# Patient Record
Sex: Male | Born: 1935
Health system: Southern US, Community
[De-identification: ages and names within clinical notes are randomized; demographics above are authoritative.]

## PROBLEM LIST (undated history)

## (undated) DIAGNOSIS — G5603 Carpal tunnel syndrome, bilateral upper limbs: Secondary | ICD-10-CM

## (undated) DIAGNOSIS — R972 Elevated prostate specific antigen [PSA]: Secondary | ICD-10-CM

## (undated) DIAGNOSIS — N529 Male erectile dysfunction, unspecified: Secondary | ICD-10-CM

## (undated) DIAGNOSIS — D126 Benign neoplasm of colon, unspecified: Secondary | ICD-10-CM

## (undated) DIAGNOSIS — E039 Hypothyroidism, unspecified: Secondary | ICD-10-CM

## (undated) DIAGNOSIS — M84376A Stress fracture, unspecified foot, initial encounter for fracture: Secondary | ICD-10-CM

## (undated) DIAGNOSIS — J189 Pneumonia, unspecified organism: Secondary | ICD-10-CM

## (undated) DIAGNOSIS — K8020 Calculus of gallbladder without cholecystitis without obstruction: Secondary | ICD-10-CM

## (undated) DIAGNOSIS — M24541 Contracture, right hand: Secondary | ICD-10-CM

## (undated) DIAGNOSIS — I10 Essential (primary) hypertension: Secondary | ICD-10-CM

## (undated) DIAGNOSIS — R7303 Prediabetes: Secondary | ICD-10-CM

## (undated) DIAGNOSIS — H9193 Unspecified hearing loss, bilateral: Secondary | ICD-10-CM

## (undated) DIAGNOSIS — R911 Solitary pulmonary nodule: Secondary | ICD-10-CM

## (undated) DIAGNOSIS — M1990 Unspecified osteoarthritis, unspecified site: Secondary | ICD-10-CM

## (undated) DIAGNOSIS — J918 Pleural effusion in other conditions classified elsewhere: Secondary | ICD-10-CM

## (undated) HISTORY — DX: Hypothyroidism, unspecified: E03.9

## (undated) HISTORY — DX: Contracture, right hand: M24.541

## (undated) HISTORY — DX: Unspecified hearing loss, bilateral: H91.93

## (undated) HISTORY — DX: Solitary pulmonary nodule: R91.1

## (undated) HISTORY — DX: Unspecified osteoarthritis, unspecified site: M19.90

## (undated) HISTORY — DX: Essential (primary) hypertension: I10

## (undated) HISTORY — PX: KNEE SURGERY: SHX244

## (undated) HISTORY — DX: Carpal tunnel syndrome, bilateral upper limbs: G56.03

## (undated) HISTORY — DX: Prediabetes: R73.03

## (undated) HISTORY — DX: Male erectile dysfunction, unspecified: N52.9

## (undated) HISTORY — DX: Benign neoplasm of colon, unspecified: D12.6

## (undated) HISTORY — PX: EYE SURGERY: SHX253

## (undated) HISTORY — DX: Pleural effusion in other conditions classified elsewhere: J91.8

## (undated) HISTORY — DX: Pneumonia, unspecified organism: J18.9

## (undated) HISTORY — DX: Elevated prostate specific antigen (PSA): R97.20

## (undated) HISTORY — DX: Stress fracture, unspecified foot, initial encounter for fracture: M84.376A

## (undated) HISTORY — PX: COLONOSCOPY W/ POLYPECTOMY: SHX1380

---

## 1968-11-04 HISTORY — PX: TONSILLECTOMY: SHX5217

## 1978-11-04 HISTORY — PX: UMBILICAL HERNIA REPAIR: SHX196

## 2010-11-07 ENCOUNTER — Encounter: Payer: Self-pay | Admitting: Family Medicine

## 2010-11-07 ENCOUNTER — Ambulatory Visit
Admission: RE | Admit: 2010-11-07 | Discharge: 2010-11-07 | Payer: Self-pay | Source: Home / Self Care | Attending: Family Medicine | Admitting: Family Medicine

## 2010-11-07 DIAGNOSIS — E039 Hypothyroidism, unspecified: Secondary | ICD-10-CM | POA: Insufficient documentation

## 2010-11-07 DIAGNOSIS — R6882 Decreased libido: Secondary | ICD-10-CM | POA: Insufficient documentation

## 2010-11-07 DIAGNOSIS — I10 Essential (primary) hypertension: Secondary | ICD-10-CM | POA: Insufficient documentation

## 2010-11-09 LAB — CONVERTED CEMR LAB
Sex Hormone Binding: 45 nmol/L (ref 13–71)
Testosterone Free: 55.4 pg/mL (ref 47.0–244.0)
Testosterone: 339.46 ng/dL (ref 250–890)

## 2010-11-23 ENCOUNTER — Encounter: Payer: Self-pay | Admitting: Family Medicine

## 2010-11-27 ENCOUNTER — Encounter: Payer: Self-pay | Admitting: Family Medicine

## 2010-11-30 ENCOUNTER — Telehealth: Payer: Self-pay | Admitting: Family Medicine

## 2010-12-06 NOTE — Letter (Signed)
Summary: ED Questionnaire  ED Questionnaire   Imported By: Lanelle Bal 11/14/2010 08:44:51  _____________________________________________________________________  External Attachment:    Type:   Image     Comment:   External Document

## 2010-12-06 NOTE — Assessment & Plan Note (Signed)
Summary: NOV:Get estab   Vital Signs:  Patient profile:   77 year old male Height:      66 inches Weight:      182 pounds BMI:     29.48 Pulse rate:   96 / minute BP sitting:   136 / 83  (right arm) Cuff size:   regular  Vitals Entered By: Avon Gully CMA, Duncan Dull) (November 07, 2010 9:14 AM) CC: NP-est care refills   CC:  NP-est care refills.  History of Present Illness: Ranout of BP medications about a month ago. Wonders if still needs med.  No hx of heart disease.   He admits has gained weight since he moved her and retired.   He also notes dec libido. Wonders if it is age. Has been that way for almost a year.  Denies any ED problems.   Habits & Providers  Alcohol-Tobacco-Diet     Alcohol drinks/day: 0     Tobacco Status: quit     Year Quit: 1990  Exercise-Depression-Behavior     Does Patient Exercise: yes     Type of exercise: walking     STD Risk: never     Drug Use: no     Seat Belt Use: always  Current Medications (verified): 1)  Aspirin 81 Mg Tbec (Aspirin) 2)  Levothyroxine Sodium 125 Mcg Tabs (Levothyroxine Sodium) 3)  Metoprolol Succinate 25 Mg Xr24h-Tab (Metoprolol Succinate) 4)  Calcium 600 1500 Mg Tabs (Calcium Carbonate) 5)  Chromium Picolinate 500 Mcg Tabs (Chromium Picolinate) 6)  Claritin 10 Mg Tabs (Loratadine)  Allergies (verified): No Known Drug Allergies  Comments:  Nurse/Medical Assistant: The patient's medications and allergies were reviewed with the patient and were updated in the Medication and Allergy Lists. Avon Gully CMA, Duncan Dull) (November 07, 2010 9:21 AM)  Past History:  Past Medical History: Thyroid problem   Past Surgical History: Umbilical Hernia 1965 Laparasocopy right knee.   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 Married to [place] with one child Former Smoker Alcohol use-no Drug use-no Regular exercise-yes Smoking Status:  quit Does Patient  Exercise:  yes STD Risk:  never Drug Use:  no Seat Belt Use:  always  Review of Systems       No fever/sweats/weakness, unexplained weight loss/gain.  No vison changes.  No difficulty hearing/ringing in ears, hay fever/allergies.  No chest pain/discomfort, palpitations.  No Br lump/nipple discharge.  No cough/wheeze.  No blood in BM, nausea/vomiting/diarrhea.  No nighttime urination, leaking urine, unusual vaginal bleeding, discharge (penis or vagina).  No muscle/joint pain. No rash, change in mole.  No HA, memory loss.  No anxiety, sleep d/o, depression.  + easy bruising/bleeding. NO unexplained lump . + concern wiht sexual function.   Physical Exam  General:  Well-developed,well-nourished,in no acute distress; alert,appropriate and cooperative throughout examination Lungs:  Normal respiratory effort, chest expands symmetrically. Lungs are clear to auscultation, no crackles or wheezes. Heart:  Normal rate and regular rhythm. S1 and S2 normal without gallop, murmur, click, rub or other extra sounds. No carotid ro abdominal bruits.  Pulses:  Radial 2+  Extremities:  No LE edema. .    Impression & Recommendations:  Problem # 1:  HYPOTHYROIDISM (ICD-244.9) Due to recheck .Says he takes it regularly.  His updated medication list for this problem includes:    Levothyroxine Sodium 125 Mcg Tabs (Levothyroxine sodium) .Marland Kitchen... Take 1 tablet by mouth once a day  Orders: T-TSH ([id number])  Problem # 2:  LIBIDO, DECREASED (ICD-799.81) TEst quesstionnair scored 4/10.  He denies any problems wiht ED. Will check levels.  May also be change in mood or his relationship with his wife.  Orders: T-Testosterone, Free and Total [phone])  Problem # 3:  HYPERTENSION, BENIGN (ICD-401.1) REstar metoprolol. Monitor for low BPS.  He is due for labwork soon. He plans to schedule a CPE next month.  His updated medication list for this problem includes:    Metoprolol Succinate 25 Mg Xr24h-tab  (Metoprolol succinate) .Marland Kitchen... Take 1 tablet by mouth once a day  Complete Medication List: 1)  Aspirin 81 Mg Tbec (Aspirin) 2)  Levothyroxine Sodium 125 Mcg Tabs (Levothyroxine sodium) .... Take 1 tablet by mouth once a day 3)  Metoprolol Succinate 25 Mg Xr24h-tab (Metoprolol succinate) .... Take 1 tablet by mouth once a day 4)  Calcium 600 1500 Mg Tabs (Calcium carbonate) 5)  Chromium Picolinate 500 Mcg Tabs (Chromium picolinate) 6)  Claritin 10 Mg Tabs (Loratadine)  Other Orders: Flu Vaccine 31yrs + (19147) Admin 1st Vaccine (82956)  Patient Instructions: 1)  Can schedule a physical anytime. 2)  We will call you with your lab results in a couple of days.  Prescriptions: METOPROLOL SUCCINATE 25 MG XR24H-TAB (METOPROLOL SUCCINATE) Take 1 tablet by mouth once a day  #90 x 0   Entered and Authorized by:   Nani Gasser MD   Signed by:   Nani Gasser MD on 11/07/2010   Method used:   Electronically to        [facility]* (retail)       [address] [place], [place]  [address]       Ph: [phone]       Fax: [phone]   RxID:   [id number]    Orders Added: 1)  T-TSH [phone] 2)  T-Testosterone, Free and Total [phone] 3)  Flu Vaccine 63yrs + [63016] 4)  Admin 1st Vaccine [90471] 5)  New Patient Level III [01093]   Immunization History:  Tetanus/Td Immunization History:    Tetanus/Td:  historical (11/04/2009)  Pneumovax Immunization History:    Pneumovax:  historical (11/04/2006)  Immunizations Administered:  Influenza Vaccine # 1:    Vaccine Type: Fluvax 3+    Site: right deltoid    Mfr: GlaxoSmithKline    Dose: 0.5 ml    Route: IM    Given by: Sue Lush McCrimmon CMA, (AAMA)    Exp. Date: 05/04/2011    Lot #: [id number]    VIS given: 05/29/10 version given November 07, 2010.  Flu Vaccine Consent Questions:    Do you have a history of severe allergic reactions to this vaccine? no    Any prior history of allergic reactions  to egg and/or gelatin? no    Do you have a sensitivity to the preservative Thimersol? no    Do you have a past history of Guillan-Barre Syndrome? no    Do you currently have an acute febrile illness? no    Have you ever had a severe reaction to latex? no    Vaccine information given and explained to patient? no   Immunization History:  Tetanus/Td Immunization History:    Tetanus/Td:  Historical (11/04/2009)  Pneumovax Immunization History:    Pneumovax:  Historical (11/04/2006)  Immunizations Administered:  Influenza Vaccine # 1:    Vaccine Type: Fluvax 3+    Site: right deltoid    Mfr: GlaxoSmithKline    Dose: 0.5  ml    Route: IM    Given by: Sue Lush McCrimmon CMA, (AAMA)    Exp. Date: 05/04/2011    Lot #: [id number]    VIS given: 05/29/10 version given November 07, 2010.   Preventive Care Screening  Colonoscopy:    Date:  10/04/2005    Next Due:  10/2010    Results:  abnormal   Last Flu Shot:    Date:  11/07/2010    Results:  Fluvax 3+    Immunization History:  Tetanus/Td Immunization History:    Tetanus/Td:  historical (11/04/2009)  Pneumovax Immunization History:    Pneumovax:  historical (11/04/2006)   Appended Document: NOV:Get estab

## 2010-12-12 ENCOUNTER — Encounter: Payer: Self-pay | Admitting: Family Medicine

## 2010-12-12 ENCOUNTER — Encounter (INDEPENDENT_AMBULATORY_CARE_PROVIDER_SITE_OTHER): Payer: Federal, State, Local not specified - PPO | Admitting: Family Medicine

## 2010-12-12 DIAGNOSIS — Z0000 Encounter for general adult medical examination without abnormal findings: Secondary | ICD-10-CM

## 2010-12-12 NOTE — Progress Notes (Signed)
Summary: Refill Levothyroxin  Phone Note Refill Request Call back at Home Phone [phone] Message from:  Patient  Refills Requested: Medication #1:  LEVOTHYROXINE SODIUM 125 MCG TABS Take 1 tablet by mouth once a day   Dosage confirmed as above?Dosage Confirmed [facility]   Method Requested: Electronic Initial call taken by: Francee Piccolo CMA Duncan Dull),  November 30, 2010 8:59 AM    Prescriptions: LEVOTHYROXINE SODIUM 125 MCG TABS (LEVOTHYROXINE SODIUM) Take 1 tablet by mouth once a day  #30 x 3   Entered by:   Avon Gully CMA, (AAMA)   Authorized by:   Nani Gasser MD   Signed by:   Avon Gully CMA, Duncan Dull) on 12/04/2010   Method used:   Electronically to        [facility]* (retail)       [address] [place], [place]  [address]       Ph: [phone]       Fax: [phone]   RxID:   [id number]

## 2010-12-17 ENCOUNTER — Encounter: Payer: Self-pay | Admitting: Family Medicine

## 2010-12-18 LAB — CONVERTED CEMR LAB
Alkaline Phosphatase: 56 units/L (ref 39–117)
BUN: 14 mg/dL (ref 6–23)
CO2: 25 meq/L (ref 19–32)
Cholesterol: 162 mg/dL (ref 0–200)
Creatinine, Ser: 0.97 mg/dL (ref 0.40–1.50)
Glucose, Bld: 95 mg/dL (ref 70–99)
HDL: 36 mg/dL — ABNORMAL LOW (ref >39)
LDL Cholesterol: 108 mg/dL — ABNORMAL HIGH (ref 0–99)
Total Bilirubin: 0.7 mg/dL (ref 0.3–1.2)
Total CHOL/HDL Ratio: 4.5
Triglycerides: 90 mg/dL (ref <150)
VLDL: 18 mg/dL (ref 0–40)

## 2010-12-20 NOTE — Assessment & Plan Note (Addendum)
Summary: CPE   Vital Signs:  Patient profile:   77 year old male Height:      66 inches Weight:      175 pounds Pulse rate:   73 / minute BP sitting:   115 / 71  (right arm) Cuff size:   regular  Vitals Entered By: Avon Gully CMA, (AAMA) (December 12, 2010 9:10 AM) CC: CPE   CC:  CPE.  History of Present Illness: Has lost 7lbs walking walking daily.   Current Medications (verified): 1)  Aspirin 81 Mg Tbec (Aspirin) 2)  Levothyroxine Sodium 125 Mcg Tabs (Levothyroxine Sodium) .... Take 1 Tablet By Mouth Once A Day 3)  Metoprolol Succinate 25 Mg Xr24h-Tab (Metoprolol Succinate) .... Take 1 Tablet By Mouth Once A Day 4)  Calcium 600 1500 Mg Tabs (Calcium Carbonate) 5)  Chromium Picolinate 500 Mcg Tabs (Chromium Picolinate) 6)  Claritin 10 Mg Tabs (Loratadine)  Allergies (verified): No Known Drug Allergies  Comments:  Nurse/Medical Assistant: The patient's medications and allergies were reviewed with the patient and were updated in the Medication and Allergy Lists. Avon Gully CMA, Duncan Dull) (December 12, 2010 9:10 AM)  Past History:  Past Medical History: Thyroid problem  Wears glasses.   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 Married to [place] with one child Former Smoker.   Alcohol use-no Drug use-no Regular exercise-yes  Review of Systems  The patient denies anorexia, fever, weight loss, weight gain, vision loss, decreased hearing, hoarseness, chest pain, syncope, dyspnea on exertion, peripheral edema, prolonged cough, headaches, hemoptysis, abdominal pain, melena, hematochezia, severe indigestion/heartburn, hematuria, incontinence, genital sores, muscle weakness, suspicious skin lesions, transient blindness, difficulty walking, depression, unusual weight change, abnormal bleeding, enlarged lymph nodes, angioedema, breast masses, and testicular masses.    Physical Exam  General:  Well-developed,well-nourished,in no  acute distress; alert,appropriate and cooperative throughout examination Head:  Normocephalic and atraumatic without obvious abnormalities. No apparent alopecia or balding. Eyes:  No corneal or conjunctival inflammation noted. EOMI. Perrla. Ears:  External ear exam shows no significant lesions or deformities.  Otoscopic examination reveals clear canals, tympanic membranes are intact bilaterally without bulging, retraction, inflammation or discharge. Hearing is grossly normal bilaterally. Nose:  External nasal examination shows no deformity or inflammation.  Mouth:  Oral mucosa and oropharynx without lesions or exudates.  Teeth in good repair. Neck:  No deformities, masses, or tenderness noted. Chest Wall:  No deformities, masses, tenderness or gynecomastia noted. Lungs:  Normal respiratory effort, chest expands symmetrically. Lungs are clear to auscultation, no crackles or wheezes. Heart:  Normal rate and regular rhythm. S1 and S2 normal without gallop, murmur, click, rub or other extra sounds. Abdomen:  Bowel sounds positive,abdomen soft and non-tender without masses, organomegaly or hernias noted. Rectal:  external hemorrhoid(s).   Prostate:  no nodules, no asymmetry, and 3+ enlarged.   Msk:  No deformity or scoliosis noted of thoracic or lumbar spine.   Pulses:  R and L carotid,radial,dorsalis pedis and posterior tibial pulses are full and equal bilaterally Extremities:  No clubbing, cyanosis, edema, or deformity noted with normal full range of motion of all joints.   Neurologic:  No cranial nerve deficits noted. Station and gait are normal. DTRs are symmetrical throughout. Sensory, motor and coordinative functions appear intact. Skin:  no rashes.   Cervical Nodes:  No lymphadenopathy noted Psych:  Cognition and judgment appear intact. Alert and cooperative with normal attention span and concentration. No apparent delusions, illusions, hallucinations  Impression &  Recommendations:  Problem # 1:  HEALTH MAINTENANCE EXAM (ICD-V70.0) Great job on weight loss.   Due for screening labs. BP looks great today.  Just had colonoscoyp 2 weeks ago.  Orders: T-PSA ([id number])  Complete Medication List: 1)  Aspirin 81 Mg Tbec (Aspirin) .... Take 1 tablet by mouth once a day 2)  Levothyroxine Sodium 125 Mcg Tabs (Levothyroxine sodium) .... Take 1 tablet by mouth once a day 3)  Metoprolol Succinate 25 Mg Xr24h-tab (Metoprolol succinate) .... Take 1 tablet by mouth once a day 4)  Calcium 600 1500 Mg Tabs (Calcium carbonate) 5)  Chromium Picolinate 500 Mcg Tabs (Chromium picolinate) 6)  Claritin 10 Mg Tabs (Loratadine) .... Take 1 tablet by mouth once a day  Other Orders: T-Comprehensive Metabolic Panel [id number]) T-Lipid Profile ([id number])  Patient Instructions: 1)  Go to the lab fasting ( 8 hours) for your cholesterol check.   2)  Recheck you thyroid in 6 months 3)  Keep up the good work with the exercise. Your blood pressure looks fantastic. If starts to run low or you feel lightheaded or dizzy then let me know as we may need to stop your metoprolol.    Orders Added: 1)  T-Comprehensive Metabolic Panel [[id number]] 2)  T-Lipid Profile [[id number]] 3)  T-PSA [[id number]] 4)  Est. Patient age 80&> [28413]   Immunization History:  Zostavax History:    Zostavax # 1:  zostavax (11/04/2008)   Immunization History:  Zostavax History:    Zostavax # 1:  Zostavax (11/04/2008)  Flex Sig Next Due:  Not Indicated Hemoccult Next Due:  Not Indicated    Immunization History:  Zostavax History:    Zostavax # 1:  zostavax (11/04/2008)  Appended Document: CPE   Preventive Care Screening  Colonoscopy:    Date:  11/23/2010    Next Due:  12/2015    Results:  normal   Appended Document: historical records update     Immunization History:  Pneumovax Immunization History:    Pneumovax:  historical (02/03/1997)

## 2011-01-01 NOTE — Procedures (Signed)
Summary: Colonoscopy Report/[facility]  Colonoscopy Report/[facility]   Imported By: Maryln Gottron 12/24/2010 13:23:59  _____________________________________________________________________  External Attachment:    Type:   Image     Comment:   External Document

## 2011-01-08 ENCOUNTER — Encounter: Payer: Self-pay | Admitting: Family Medicine

## 2011-01-08 DIAGNOSIS — N529 Male erectile dysfunction, unspecified: Secondary | ICD-10-CM | POA: Insufficient documentation

## 2011-01-15 NOTE — Letter (Signed)
Summary: Records Dated 11-96 to 5-11/[facility]  Records Dated 11-96 to 5-11/[facility]   Imported By: Lanelle Bal 01/11/2011 12:32:56  _____________________________________________________________________  External Attachment:    Type:   Image     Comment:   External Document

## 2011-03-26 ENCOUNTER — Other Ambulatory Visit: Payer: Self-pay | Admitting: Family Medicine

## 2011-04-24 ENCOUNTER — Other Ambulatory Visit: Payer: Self-pay | Admitting: Family Medicine

## 2011-05-23 ENCOUNTER — Other Ambulatory Visit: Payer: Self-pay | Admitting: Family Medicine

## 2011-06-22 ENCOUNTER — Other Ambulatory Visit: Payer: Self-pay | Admitting: Family Medicine

## 2011-07-25 ENCOUNTER — Other Ambulatory Visit: Payer: Self-pay | Admitting: Family Medicine

## 2011-08-24 ENCOUNTER — Other Ambulatory Visit: Payer: Self-pay | Admitting: Family Medicine

## 2011-11-11 ENCOUNTER — Other Ambulatory Visit: Payer: Self-pay | Admitting: Family Medicine

## 2011-11-11 ENCOUNTER — Encounter: Payer: Self-pay | Admitting: Family Medicine

## 2011-11-11 ENCOUNTER — Ambulatory Visit (INDEPENDENT_AMBULATORY_CARE_PROVIDER_SITE_OTHER): Payer: BC Managed Care – PPO | Admitting: Family Medicine

## 2011-11-11 ENCOUNTER — Ambulatory Visit (HOSPITAL_BASED_OUTPATIENT_CLINIC_OR_DEPARTMENT_OTHER)
Admission: RE | Admit: 2011-11-11 | Discharge: 2011-11-11 | Disposition: A | Discharge status: Home / Self Care | Payer: Federal, State, Local not specified - PPO | Source: Ambulatory Visit | Attending: Family Medicine | Admitting: Family Medicine

## 2011-11-11 VITALS — BP 150/79 | HR 68 | Temp 97.7°F | Ht 66.0 in | Wt 178.0 lb

## 2011-11-11 DIAGNOSIS — Z23 Encounter for immunization: Secondary | ICD-10-CM

## 2011-11-11 DIAGNOSIS — R059 Cough, unspecified: Secondary | ICD-10-CM

## 2011-11-11 DIAGNOSIS — R053 Chronic cough: Secondary | ICD-10-CM

## 2011-11-11 DIAGNOSIS — R05 Cough: Secondary | ICD-10-CM

## 2011-11-11 DIAGNOSIS — K130 Diseases of lips: Secondary | ICD-10-CM | POA: Insufficient documentation

## 2011-11-11 DIAGNOSIS — Z125 Encounter for screening for malignant neoplasm of prostate: Secondary | ICD-10-CM

## 2011-11-11 DIAGNOSIS — E039 Hypothyroidism, unspecified: Secondary | ICD-10-CM

## 2011-11-11 DIAGNOSIS — Z0000 Encounter for general adult medical examination without abnormal findings: Secondary | ICD-10-CM

## 2011-11-11 DIAGNOSIS — I10 Essential (primary) hypertension: Secondary | ICD-10-CM

## 2011-11-11 DIAGNOSIS — R972 Elevated prostate specific antigen [PSA]: Secondary | ICD-10-CM | POA: Insufficient documentation

## 2011-11-11 HISTORY — DX: Elevated prostate specific antigen (PSA): R97.20

## 2011-11-11 LAB — CBC WITH DIFFERENTIAL/PLATELET
Eosinophils Relative: 3.4 % (ref 0.0–5.0)
HCT: 47.8 % (ref 39.0–52.0)
Hemoglobin: 16.5 g/dL (ref 13.0–17.0)
Lymphs Abs: 1.9 10*3/uL (ref 0.7–4.0)
Monocytes Relative: 9.3 % (ref 3.0–12.0)
Neutro Abs: 3.5 10*3/uL (ref 1.4–7.7)
RBC: 4.97 Mil/uL (ref 4.22–5.81)
WBC: 6.3 10*3/uL (ref 4.5–10.5)

## 2011-11-11 LAB — TSH: TSH: 0.53 u[IU]/mL (ref 0.35–5.50)

## 2011-11-11 LAB — PSA: PSA: 3.94 ng/mL (ref 0.10–4.00)

## 2011-11-11 LAB — LIPID PANEL
LDL Cholesterol: 123 mg/dL — ABNORMAL HIGH (ref 0–99)
VLDL: 17.6 mg/dL (ref 0.0–40.0)

## 2011-11-11 MED ORDER — AZITHROMYCIN 250 MG PO TABS: ORAL_TABLET | ORAL | Status: DC

## 2011-11-11 NOTE — Assessment & Plan Note (Signed)
Appearance suspicious for Basal cell or squamous cell carcinoma. Refer to Dr. Jorja Loa at [facility] in [place].

## 2011-11-11 NOTE — Assessment & Plan Note (Signed)
This has been stable. Lab Results  Component Value Date   TSH 2.114 11/07/2010    Recheck TSH today. If normal again today will RF med and plan repeat TSH in 1 yr.

## 2011-11-11 NOTE — Progress Notes (Addendum)
Office Note 11/11/2011  CC:  Chief Complaint  Patient presents with  . Establish Care    transfer from Dr. Linford Arnold, our office closer    HPI:  Joel Kelley is a 78 y.o. White male who is here to establish/transfer from [place]. Patient's most recent primary MD: Dr. Linford Arnold. Old records in [facility] were reviewed prior to or during today's visit.  Past Medical History  Diagnosis Date  . HTN (hypertension)   . Hypothyroidism   . Erectile dysfunction   . Adenomatous colon polyp 2012    Bx 2012 showed tubular adenoma but no HG dysplasia  . Hearing loss of both ears     +hearing aids    Past Surgical History  Procedure Date  . Knee surgery     2009, laperoscopic  . Umbilical hernia repair 1980  . Tonsillectomy 1970    No family history on file.  History   Social History  . Marital Status: Married    Spouse Name: Stanton Kidney    Number of Children: N/A  . Years of Education: N/A   Occupational History  . Not on file.   Social History Main Topics  . Smoking status: Former Smoker    Types: Cigarettes  . Smokeless tobacco: Never Used   Comment: quit 20+ years ago  . Alcohol Use: No     None in 18 years  . Drug Use: No  . Sexually Active: Not on file   Other Topics Concern  . 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    Outpatient Encounter Prescriptions as of 11/11/2011  Medication Sig Dispense Refill  . aspirin EC 81 MG tablet Take 81 mg by mouth daily.        . calcium carbonate (CALCIUM 600) 600 MG TABS Take 600 mg by mouth daily.        Marland Kitchen levothyroxine (SYNTHROID, LEVOTHROID) 125 MCG tablet TAKE 1 TABLET BY MOUTH EVERY DAY  90 tablet  0  . loratadine (CLARITIN) 10 MG tablet Take  10 mg by mouth daily.        . Multiple Vitamins-Minerals (CENTRUM SILVER PO) Take 1 tablet by mouth daily.        . niacin 500 MG tablet Take 500 mg by mouth daily with breakfast.        . Omega-3 Fatty Acids (FISH OIL) 1000 MG CAPS Take 1 capsule by mouth daily.        . Psyllium (METAMUCIL PO) Take by mouth daily.        Marland Kitchen DISCONTD: levothyroxine (SYNTHROID, LEVOTHROID) 125 MCG tablet TAKE 1 TABLET BY MOUTH EVERY DAY  90 tablet  0    No Known Allergies  ROS Review of Systems  Constitutional: Negative for fever, chills, appetite change and fatigue.  HENT: Positive for hearing loss (chronic, wears hearing aids bilat). Negative for ear pain, congestion, sore throat, neck stiffness and dental problem.   Eyes: Negative for discharge, redness and visual disturbance.  Respiratory: Negative for chest tightness, shortness of breath and wheezing.        See HPI  Cardiovascular: Negative for chest pain, palpitations and leg swelling.  Gastrointestinal: Negative for nausea, vomiting, abdominal pain, diarrhea and blood in stool.  Genitourinary: Negative for dysuria,  urgency, frequency, hematuria, flank pain and difficulty urinating.  Musculoskeletal: Positive for arthralgias (fingers, stiffness in DIP jts of 2nd-5th digits on both hands). Negative for myalgias, back pain and joint swelling.  Skin: Negative for pallor and rash.       See hpi  Neurological: Negative for dizziness, speech difficulty, weakness and headaches.  Hematological: Negative for adenopathy. Does not bruise/bleed easily.  Psychiatric/Behavioral: Negative for confusion, sleep disturbance and dysphoric mood. The patient is not nervous/anxious.     PE; Blood pressure 150/79, pulse 68, temperature 97.7 F (36.5 C), temperature source Temporal, height 5\' 6"  (1.676 m), weight 178 lb (80.74 kg), SpO2 97.00%. Gen: Alert, well appearing.  Patient is oriented to person, place, time, and situation.  Pleasant affect, lucid thought and  conversation. ENT: Ears: EACs clear, normal epithelium.  TMs with good light reflex and landmarks bilaterally.  Eyes: no injection, icteris, swelling, or exudate.  EOMI, PERRLA. Nose: no drainage or turbinate edema/swelling.  No injection or focal lesion.  Mouth: normal except lower lip has a midline 1cm oval crusty nodule.  Oral mucosa pink and moist.  Partials in place, gingiva unremarkable.  Oropharynx without erythema, exudate, or swelling.  Neck - No masses or thyromegaly or limitation in range of motion RRR, without murmur, rub, or gallop. Carotid pulses easily palpable, symmetric pulsations, no bruit or palpable dilatation. Radial, femoral, and ankle pulses easily palpable and symmetric. No abdominal bruit. No varicosities.  Cap refill in extremities is brisk. Chest with symmetric expansion, good aeration, nonlabored respirations.  Clear and equal breath sounds in all lung fields.  No clubbing or cyanosis. ABD: soft, NT, ND but mildly rotund, BS normal.  No hepatospenomegaly or mass.  No bruits. EXT: no clubbing, cyanosis, or edema.  Musculoskeletal: no joint swelling, erythema, warmth, or tenderness.  ROM of all joints intact. Neuro: CN 2-12 intact bilaterally, strength 5/5 in proximal and distal upper extremities and lower extremities bilaterally.  No sensory deficits.  No tremor.  No disdiadochokinesis.  No ataxia.  Upper extremity and lower extremity DTRs symmetric.  No pronator drift.  Pertinent labs:  None today  ASSESSMENT AND PLAN:   Health maintenance examination Reviewed age and gender appropriate health maintenance issues (prudent diet, regular exercise, health risks of tobacco and excessive alcohol, use of seatbelts, fire alarms in home, use of sunscreen).  Also reviewed age and gender appropriate health screening as well as vaccine recommendations.  Gave flu vaccine IM today.  He is otherwise UTD on vacc's. Checked CBC, CMET, TSH, and FLP today. Also checked PSA for  prostate cancer screening.   Lip lesion Appearance suspicious for Basal cell or squamous cell carcinoma. Refer to Dr. Jorja Loa at [facility] in [place].  Persistent cough Minimally bothersome and only occuring in morning for a brief period, likely residual post-viral pneumonitis cough---reassured. Will check CXR PA/Lat today.  HYPOTHYROIDISM This has been stable. Lab Results  Component Value Date   TSH 2.114 11/07/2010    Recheck TSH today. If normal again today will RF med and plan repeat TSH in 1 yr.  HYPERTENSION, BENIGN Intolerant of metoprolol (dizzy). Need to get pt back on a bp med.  Says he has never been on any other Bp med in the past. Will give recommendation about this when I get his labs back (renal fxn, potassium).   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  Mild stiffness for 2 hours in AM. I advised him to try  tylenol 500-1000mg  qAM to see if this helps some.  F/u 71mo for hypothyroidism and HTN.

## 2011-11-11 NOTE — Assessment & Plan Note (Signed)
Minimally bothersome and only occuring in morning for a brief period, likely residual post-viral pneumonitis cough---reassured. Will check CXR PA/Lat today.

## 2011-11-11 NOTE — Assessment & Plan Note (Signed)
Reviewed age and gender appropriate health maintenance issues (prudent diet, regular exercise, health risks of tobacco and excessive alcohol, use of seatbelts, fire alarms in home, use of sunscreen).  Also reviewed age and gender appropriate health screening as well as vaccine recommendations.  Gave flu vaccine IM today.  He is otherwise UTD on vacc's. Checked CBC, CMET, TSH, and FLP today. Also checked PSA for prostate cancer screening.

## 2011-11-11 NOTE — Assessment & Plan Note (Addendum)
Intolerant of metoprolol (dizzy). Need to get pt back on a bp med.  Says he has never been on any other Bp med in the past. Will give recommendation about this when I get his labs back (renal fxn, potassium).

## 2011-11-12 ENCOUNTER — Telehealth: Payer: Self-pay | Admitting: Family Medicine

## 2011-11-12 DIAGNOSIS — R972 Elevated prostate specific antigen [PSA]: Secondary | ICD-10-CM

## 2011-11-12 LAB — COMPREHENSIVE METABOLIC PANEL
ALT: 23 U/L (ref 0–53)
AST: 22 U/L (ref 0–37)
Albumin: 3.9 g/dL (ref 3.5–5.2)
BUN: 17 mg/dL (ref 6–23)
CO2: 29 mEq/L (ref 19–32)
Calcium: 8.8 mg/dL (ref 8.4–10.5)
Chloride: 109 mEq/L (ref 96–112)
Potassium: 4.4 mEq/L (ref 3.5–5.1)

## 2011-11-12 MED ORDER — LISINOPRIL 10 MG PO TABS: 10.0000 mg | ORAL_TABLET | Freq: Every day | ORAL | Status: DC

## 2011-11-12 NOTE — Telephone Encounter (Signed)
Notified pt of increased PSA velocity on recent testing.   He is ok with urology referral and I have ordered this today. I also discussed starting lisinopril 10mg  qd with him and he's ok with this so I'll send eRx today. His lipids were not at goal, but will discuss this at f/u visit in 35mo or at another f/u visit (need to start statin b/c his goal LDL is <100 due to his RF's). F/u 35mo for pneumonia and HTN.--PM

## 2011-11-22 ENCOUNTER — Telehealth: Payer: Self-pay | Admitting: Family Medicine

## 2011-11-22 MED ORDER — LEVOTHYROXINE SODIUM 125 MCG PO TABS: 125.0000 ug | ORAL_TABLET | Freq: Every day | ORAL | Status: DC

## 2011-11-22 NOTE — Telephone Encounter (Signed)
Please send Rx for Levothyroxine to pharmacy on file

## 2011-11-22 NOTE — Telephone Encounter (Signed)
Last seen 1`/7, follow up in 6 months.  RX sent.

## 2011-12-13 ENCOUNTER — Ambulatory Visit (INDEPENDENT_AMBULATORY_CARE_PROVIDER_SITE_OTHER): Payer: Federal, State, Local not specified - PPO | Admitting: Family Medicine

## 2011-12-13 ENCOUNTER — Encounter: Payer: Self-pay | Admitting: Family Medicine

## 2011-12-13 DIAGNOSIS — K130 Diseases of lips: Secondary | ICD-10-CM

## 2011-12-13 DIAGNOSIS — I10 Essential (primary) hypertension: Secondary | ICD-10-CM

## 2011-12-13 DIAGNOSIS — J189 Pneumonia, unspecified organism: Secondary | ICD-10-CM | POA: Insufficient documentation

## 2011-12-13 DIAGNOSIS — E785 Hyperlipidemia, unspecified: Secondary | ICD-10-CM | POA: Insufficient documentation

## 2011-12-13 MED ORDER — ATORVASTATIN CALCIUM 10 MG PO TABS: 10.0000 mg | ORAL_TABLET | Freq: Every day | ORAL | Status: DC

## 2011-12-13 NOTE — Assessment & Plan Note (Signed)
Resolved.  He has a bit of residual cough but no sign of persistent infection.

## 2011-12-13 NOTE — Assessment & Plan Note (Signed)
Improved over the last month, but I still think he should keep his derm appt that is set for next week to take a look at this lesion.

## 2011-12-13 NOTE — Progress Notes (Signed)
OFFICE VISIT  12/13/2011   CC:  Chief Complaint  Patient presents with  . Pneumonia    still has productive cough, clear/white phlegm  . Hypertension     HPI:    Patient is a 78 y.o. Caucasian male who presents for 1 mo f/u HTN, pneumonia, hyperlipidemia. Started lisinopril 10mg  80mo ago.  He had slight infiltrate on CXR in setting of prolonged resp illness at that time so I rx'd a course of azithromycin.  He has improved significantly, still has some intermittent daytime coughing with a bit of clear sputum.  No fever, no SOB, no fatigue.  Appetite is good.  Has not checked BP outside of our office since last visit here.  Tolerating lisinopril without side effect. Says lower lip lesion has improved.  He has an initial consult visit set up with urology 01/08/12 for increased PSA velocity.  Past Medical History  Diagnosis Date  . HTN (hypertension)   . Hypothyroidism   . Erectile dysfunction   . Adenomatous colon polyp 2012    Bx 2012 showed tubular adenoma but no HG dysplasia  . Hearing loss of both ears     +hearing aids    Past Surgical History  Procedure Date  . Knee surgery     2009, laperoscopic  . Umbilical hernia repair 1980  . Tonsillectomy 1970    Outpatient Prescriptions Prior to Visit  Medication Sig Dispense Refill  . aspirin EC 81 MG tablet Take 81 mg by mouth daily.        . calcium carbonate (CALCIUM 600) 600 MG TABS Take 600 mg by mouth daily.        Marland Kitchen levothyroxine (SYNTHROID, LEVOTHROID) 125 MCG tablet Take 1 tablet (125 mcg total) by mouth daily.  90 tablet  1  . lisinopril (PRINIVIL,ZESTRIL) 10 MG tablet Take 1 tablet (10 mg total) by mouth daily.  30 tablet  3  . loratadine (CLARITIN) 10 MG tablet Take 10 mg by mouth daily.        . Multiple Vitamins-Minerals (CENTRUM SILVER PO) Take 1 tablet by mouth daily.        . niacin 500 MG tablet Take 500 mg by mouth daily with breakfast.        . Omega-3 Fatty Acids (FISH OIL) 1000 MG CAPS Take 1 capsule by  mouth daily.        . Psyllium (METAMUCIL PO) Take by mouth daily.        Marland Kitchen azithromycin (ZITHROMAX) 250 MG tablet 2 tabs po qd x 1d, then 1 tab po qd x 4d  6 each  0    No Known Allergies  ROS As per HPI  PE: Blood pressure 131/80, pulse 69, height 5\' 6"  (1.676 m), weight 174 lb (78.926 kg). Gen: Alert, well appearing.  Patient is oriented to person, place, time, and situation. Lower lip: central portion with small mucosal irregularity--almost like a plaque.  No longer appears ulcerated. NECK: no LAD. CV: RRR, no m/r/g.   LUNGS: CTA bilat, nonlabored resps, good aeration in all lung fields.  LABS:  None today  IMPRESSION AND PLAN:  HYPERTENSION, BENIGN Controlled.  Continue lisinopril 10mg  qd.  Pneumonia Resolved.  He has a bit of residual cough but no sign of persistent infection.  Hyperlipidemia Lab Results  Component Value Date   CHOL 179 11/11/2011   HDL 38.70* 11/11/2011   LDLCALC 123* 11/11/2011   TRIG 88.0 11/11/2011   CHOLHDL 5 11/11/2011   I'd like his LDL  goal to be around 100 due to RFs. Start atorvastatin 10mg  qd today.  Recheck lipids and CMET in 57mo.  Therapeutic expectations and side effect profile of medication discussed today.  Patient's questions answered.   Lip lesion Improved over the last month, but I still think he should keep his derm appt that is set for next week to take a look at this lesion.     FOLLOW UP: Return in about 3 months (around 03/11/2012) for (fasting) morning appt with me for f/u HTN and lipid panel.

## 2011-12-13 NOTE — Assessment & Plan Note (Signed)
Lab Results  Component Value Date   CHOL 179 11/11/2011   HDL 38.70* 11/11/2011   LDLCALC 123* 11/11/2011   TRIG 88.0 11/11/2011   CHOLHDL 5 11/11/2011   I'd like his LDL goal to be around 100 due to RFs. Start atorvastatin 10mg  qd today.  Recheck lipids and CMET in 35mo.  Therapeutic expectations and side effect profile of medication discussed today.  Patient's questions answered.

## 2011-12-13 NOTE — Assessment & Plan Note (Signed)
Controlled.  Continue lisinopril 10mg  qd.

## 2012-01-14 ENCOUNTER — Encounter: Payer: Self-pay | Admitting: Family Medicine

## 2012-03-09 ENCOUNTER — Other Ambulatory Visit: Payer: Self-pay | Admitting: *Deleted

## 2012-03-09 MED ORDER — LISINOPRIL 10 MG PO TABS: 10.0000 mg | ORAL_TABLET | Freq: Every day | ORAL | Status: DC

## 2012-03-09 NOTE — Telephone Encounter (Signed)
Faxed refill request received from pharmacy for LISINOPRIL Last filled by MD on 11/12/11, #30 X 3 Last seen on 12/13/11 Follow up May 2013, no appt scheduled. 30 day supply sent to pharmacy.

## 2012-03-11 ENCOUNTER — Encounter: Payer: Self-pay | Admitting: Family Medicine

## 2012-03-11 ENCOUNTER — Ambulatory Visit (INDEPENDENT_AMBULATORY_CARE_PROVIDER_SITE_OTHER): Payer: Federal, State, Local not specified - PPO | Admitting: Family Medicine

## 2012-03-11 VITALS — BP 139/75 | HR 69 | Ht 66.0 in | Wt 170.0 lb

## 2012-03-11 DIAGNOSIS — K130 Diseases of lips: Secondary | ICD-10-CM

## 2012-03-11 DIAGNOSIS — R972 Elevated prostate specific antigen [PSA]: Secondary | ICD-10-CM

## 2012-03-11 DIAGNOSIS — I10 Essential (primary) hypertension: Secondary | ICD-10-CM

## 2012-03-11 DIAGNOSIS — R059 Cough, unspecified: Secondary | ICD-10-CM

## 2012-03-11 DIAGNOSIS — E039 Hypothyroidism, unspecified: Secondary | ICD-10-CM

## 2012-03-11 DIAGNOSIS — E785 Hyperlipidemia, unspecified: Secondary | ICD-10-CM

## 2012-03-11 DIAGNOSIS — R05 Cough: Secondary | ICD-10-CM

## 2012-03-11 LAB — TSH: TSH: 0.46 u[IU]/mL (ref 0.35–5.50)

## 2012-03-11 LAB — LIPID PANEL
Cholesterol: 120 mg/dL (ref 0–200)
HDL: 38.7 mg/dL — ABNORMAL LOW (ref >39.00)
LDL Cholesterol: 67 mg/dL (ref 0–99)
Triglycerides: 74 mg/dL (ref 0.0–149.0)
VLDL: 14.8 mg/dL (ref 0.0–40.0)

## 2012-03-11 NOTE — Assessment & Plan Note (Signed)
Last TSH 11/2011 was borderline low. Will recheck this today.

## 2012-03-11 NOTE — Assessment & Plan Note (Signed)
Problem stable.  Continue current medications and diet appropriate for this condition.  We have reviewed our general long term plan for this problem and also reviewed symptoms and signs that should prompt the patient to call or return to the office.  

## 2012-03-11 NOTE — Assessment & Plan Note (Signed)
Mild bronchitis/resolving URI--he is gradually improving and feels well and looks excellent today. Discussed signs/symptoms of worsening illness and what to call or return for, otherwise continue symptomatic care with mucinex or mucinex DM.

## 2012-03-11 NOTE — Assessment & Plan Note (Signed)
Tolerating atorvastatin 10mg  the last 49mo. Recheck lipids, AST/ALT today.

## 2012-03-11 NOTE — Progress Notes (Signed)
OFFICE VISIT  03/11/2012   CC:  Chief Complaint  Patient presents with  . Follow-up    HTN, lipids     HPI:    Patient is a 78 y.o. Hispanic male who presents for 3 mo f/u for HTN, hyperlipidemia. Started atorvastatin 10mg  qd 3 mo ago.  Tolerating this well.  Feels like he's getting over a cold over the last 2 wks, has a lingering cough again.  No chest pains.  No fevers.  Appetite and energy level good.  URI sx's initially --lasted 3-4 days--have resolved but slightly productive cough persists. He has had pneumonia 3 times in the last 5 yrs.  He reports that he had a prostate biopsy since last visit here and it was negative/benign: he has plans for f/u with urologist in about 74mo. He also went to the dermatologist and his lip lesion, which the dermatologist agreed did look cancerous, was completely frozen off and is all gone now.  ROS: no GERD, no abd pains, no undesired wt loss, no wheezing or chest tightness, no ST, no HA. No hematuria or dysuria or voiding problems.    Past Medical History  Diagnosis Date  . HTN (hypertension)   . Hypothyroidism   . Erectile dysfunction   . Adenomatous colon polyp 2012    Bx 2012 showed tubular adenoma but no HG dysplasia  . Hearing loss of both ears     +hearing aids  . Increased prostate specific antigen (PSA) velocity 11/11/2011    Urol eval 01/08/12--plans to proceed with biopsy    Past Surgical History  Procedure Date  . Knee surgery     2009, laperoscopic  . Umbilical hernia repair 1980  . Tonsillectomy 1970    Outpatient Prescriptions Prior to Visit  Medication Sig Dispense Refill  . aspirin EC 81 MG tablet Take 81 mg by mouth daily.        Marland Kitchen atorvastatin (LIPITOR) 10 MG tablet Take 1 tablet (10 mg total) by mouth daily.  30 tablet  3  . calcium carbonate (CALCIUM 600) 600 MG TABS Take 600 mg by mouth daily.        Marland Kitchen levothyroxine (SYNTHROID, LEVOTHROID) 125 MCG tablet Take 1 tablet (125 mcg total) by mouth daily.  90 tablet   1  . lisinopril (PRINIVIL,ZESTRIL) 10 MG tablet Take 1 tablet (10 mg total) by mouth daily. MUST HAVE APPOINTMENT FOR MORE REFILLS.  30 tablet  0  . loratadine (CLARITIN) 10 MG tablet Take 10 mg by mouth daily.        . Multiple Vitamins-Minerals (CENTRUM SILVER PO) Take 1 tablet by mouth daily.        . niacin 500 MG tablet Take 500 mg by mouth daily with breakfast.        . Omega-3 Fatty Acids (FISH OIL) 1000 MG CAPS Take 1 capsule by mouth daily.        . Psyllium (METAMUCIL PO) Take by mouth daily.          No Known Allergies  ROS As per HPI  PE: Blood pressure 139/75, pulse 69, height 5\' 6"  (1.676 m), weight 170 lb (77.111 kg). Gen: Alert, well appearing.  Patient is oriented to person, place, time, and situation. ENT: Ears: EACs clear, normal epithelium.  TMs with good light reflex and landmarks bilaterally.  Eyes: no injection, icteris, swelling, or exudate.  EOMI, PERRLA. Nose: no drainage or turbinate edema/swelling.  No injection or focal lesion.  Mouth: lips without lesion/swelling.  Oral  mucosa pink and moist.  Dentition intact and without obvious caries or gingival swelling.  Oropharynx without erythema, exudate, or swelling.  Neck: supple/nontender.  No LAD, mass, or TM.  Carotid pulses 2+ bilaterally, without bruits. CV: RRR, no m/r/g.   LUNGS: CTA bilat, nonlabored resps, good aeration in all lung fields. EXT: no clubbing, cyanosis, or edema.   LABS:  None today  IMPRESSION AND PLAN:  HYPERTENSION, BENIGN Problem stable.  Continue current medications and diet appropriate for this condition.  We have reviewed our general long term plan for this problem and also reviewed symptoms and signs that should prompt the patient to call or return to the office.   Hyperlipidemia Tolerating atorvastatin 10mg  the last 35mo. Recheck lipids, AST/ALT today.  HYPOTHYROIDISM Last TSH 11/2011 was borderline low. Will recheck this today.  Increased prostate specific antigen (PSA)  velocity Recent prostate biopsy neg per pt report. He has plan for f/u with urologist in about 20mo.  Lip lesion Successfully frozen off by dermatologist since last visit.  Cough Mild bronchitis/resolving URI--he is gradually improving and feels well and looks excellent today. Discussed signs/symptoms of worsening illness and what to call or return for, otherwise continue symptomatic care with mucinex or mucinex DM.     FOLLOW UP: Return in about 6 months (around 09/11/2012) for f/u HTN, hyperlip, hypothyr.

## 2012-03-11 NOTE — Assessment & Plan Note (Signed)
Successfully frozen off by dermatologist since last visit.

## 2012-03-11 NOTE — Assessment & Plan Note (Signed)
Recent prostate biopsy neg per pt report. He has plan for f/u with urologist in about 61mo.

## 2012-03-24 ENCOUNTER — Other Ambulatory Visit: Payer: Self-pay | Admitting: *Deleted

## 2012-03-24 MED ORDER — LISINOPRIL 10 MG PO TABS: 10.0000 mg | ORAL_TABLET | Freq: Every day | ORAL | Status: DC

## 2012-03-24 NOTE — Telephone Encounter (Signed)
Faxed refill request received from pharmacy for Lisinopril 90 day Last filled by MD on 03/09/12, #30 x 0 Last seen on 03/11/12 Follow up in 6 months. RX sent.

## 2012-04-07 ENCOUNTER — Other Ambulatory Visit: Payer: Self-pay | Admitting: *Deleted

## 2012-04-07 MED ORDER — ATORVASTATIN CALCIUM 10 MG PO TABS: 10.0000 mg | ORAL_TABLET | Freq: Every day | ORAL | Status: DC

## 2012-04-07 NOTE — Telephone Encounter (Signed)
Faxed refill request received from pharmacy for atorvastatin Last filled by MD on 12/13/11, #30 x 3 Last seen on 03/11/12 Follow up in 6 months. RX sent.

## 2012-08-17 ENCOUNTER — Other Ambulatory Visit: Payer: Self-pay | Admitting: Family Medicine

## 2012-08-19 ENCOUNTER — Encounter: Payer: Self-pay | Admitting: Family Medicine

## 2012-08-19 ENCOUNTER — Other Ambulatory Visit: Payer: Self-pay | Admitting: *Deleted

## 2012-08-19 MED ORDER — LEVOTHYROXINE SODIUM 125 MCG PO TABS: 125.0000 ug | ORAL_TABLET | Freq: Every day | ORAL | Status: DC

## 2012-08-19 NOTE — Telephone Encounter (Signed)
Faxed refill request received from pharmacy for LEVOTHYROXINE  Last filled by MD on 11/22/11, #90 X 1 Last seen on 03/11/12 Follow up 09/11/12 RX SENT.

## 2012-09-11 ENCOUNTER — Encounter: Payer: Self-pay | Admitting: Family Medicine

## 2012-09-11 ENCOUNTER — Ambulatory Visit (INDEPENDENT_AMBULATORY_CARE_PROVIDER_SITE_OTHER): Payer: Federal, State, Local not specified - PPO | Admitting: Family Medicine

## 2012-09-11 VITALS — BP 138/71 | HR 66 | Temp 97.6°F | Ht 66.0 in | Wt 174.0 lb

## 2012-09-11 DIAGNOSIS — I10 Essential (primary) hypertension: Secondary | ICD-10-CM

## 2012-09-11 DIAGNOSIS — E785 Hyperlipidemia, unspecified: Secondary | ICD-10-CM

## 2012-09-11 DIAGNOSIS — R972 Elevated prostate specific antigen [PSA]: Secondary | ICD-10-CM

## 2012-09-11 DIAGNOSIS — E039 Hypothyroidism, unspecified: Secondary | ICD-10-CM

## 2012-09-11 DIAGNOSIS — Z0000 Encounter for general adult medical examination without abnormal findings: Secondary | ICD-10-CM

## 2012-09-11 LAB — COMPREHENSIVE METABOLIC PANEL
ALT: 19 U/L (ref 0–53)
CO2: 29 mEq/L (ref 19–32)
Calcium: 8.6 mg/dL (ref 8.4–10.5)
Chloride: 108 mEq/L (ref 96–112)
Creatinine, Ser: 1.1 mg/dL (ref 0.4–1.5)
GFR: 72.93 mL/min (ref >60.00)
Glucose, Bld: 93 mg/dL (ref 70–99)
Total Bilirubin: 1 mg/dL (ref 0.3–1.2)

## 2012-09-11 LAB — LIPID PANEL
HDL: 39.2 mg/dL (ref >39.00)
Total CHOL/HDL Ratio: 3

## 2012-09-11 NOTE — Progress Notes (Signed)
OFFICE NOTE  09/11/2012  CC:  Chief Complaint  Patient presents with  . Follow-up    HTN, cholesterol, hypothyroid     HPI: Patient is a 78 y.o. Caucasian male who is here for HTN, hypothyroidism, and hyperlipidemia. Urologist checkup a few weeks ago was good, PSA stable and he has f/u 6 mo. Compliant with meds, no side effects.  He feels good, has no acute complaints. Has been walking still and lately raking a lot of leaves for exercise.   Pertinent PMH:  Past Medical History  Diagnosis Date  . HTN (hypertension)   . Hypothyroidism   . Erectile dysfunction   . Adenomatous colon polyp 2012    Bx 2012 showed tubular adenoma but no HG dysplasia  . Hearing loss of both ears     +hearing aids  . Increased prostate specific antigen (PSA) velocity 11/11/2011    Biopsy negative 03/11/12.  Followed by Dr. Laverle Patter.    MEDS:  Outpatient Prescriptions Prior to Visit  Medication Sig Dispense Refill  . aspirin EC 81 MG tablet Take 81 mg by mouth daily.        Marland Kitchen atorvastatin (LIPITOR) 10 MG tablet Take 1 tablet (10 mg total) by mouth daily.  30 tablet  5  . calcium carbonate (CALCIUM 600) 600 MG TABS Take 600 mg by mouth daily.        Marland Kitchen levothyroxine (SYNTHROID, LEVOTHROID) 125 MCG tablet Take 1 tablet (125 mcg total) by mouth daily.  90 tablet  1  . lisinopril (PRINIVIL,ZESTRIL) 10 MG tablet Take 1 tablet (10 mg total) by mouth daily.  90 tablet  1  . loratadine (CLARITIN) 10 MG tablet Take 10 mg by mouth daily.        . Multiple Vitamins-Minerals (CENTRUM SILVER PO) Take 1 tablet by mouth daily.        . niacin 500 MG tablet Take 500 mg by mouth daily with breakfast.        . Omega-3 Fatty Acids (FISH OIL) 1000 MG CAPS Take 1 capsule by mouth daily.        . Psyllium (METAMUCIL PO) Take by mouth daily.         Last reviewed on 09/11/2012  8:13 AM by Jeoffrey Massed, MD  PE: Blood pressure 138/71, pulse 66, temperature 97.6 F (36.4 C), temperature source Temporal, height 5\' 6"  (1.676  m), weight 174 lb (78.926 kg). Gen: Alert, well appearing.  Patient is oriented to person, place, time, and situation. Neck - No masses or thyromegaly or limitation in range of motion CV: RRR, no m/r/g.   LUNGS: CTA bilat, nonlabored resps, good aeration in all lung fields. EXT: no clubbing, cyanosis, or edema.   LAB: none today  IMPRESSION AND PLAN:  HYPERTENSION, BENIGN Problem stable.  Continue current medications and diet appropriate for this condition.  We have reviewed our general long term plan for this problem and also reviewed symptoms and signs that should prompt the patient to call or return to the office. Check Cr/lytes.  Hyperlipidemia Problem stable.  Continue current medications and diet appropriate for this condition.  We have reviewed our general long term plan for this problem and also reviewed symptoms and signs that should prompt the patient to call or return to the office. Check FLP.  HYPOTHYROIDISM Problem stable.  Continue current medications and diet appropriate for this condition.  We have reviewed our general long term plan for this problem and also reviewed symptoms and signs that should  prompt the patient to call or return to the office.   Increased prostate specific antigen (PSA) velocity Stable and getting appropriate urologic f/u.   Flu vaccine IM today.  An After Visit Summary was printed and given to the patient.  FOLLOW UP:  6 mo for CPE, fasting labs the week prior.

## 2012-09-11 NOTE — Assessment & Plan Note (Signed)
Problem stable.  Continue current medications and diet appropriate for this condition.  We have reviewed our general long term plan for this problem and also reviewed symptoms and signs that should prompt the patient to call or return to the office. Check FLP.

## 2012-09-11 NOTE — Assessment & Plan Note (Signed)
Stable and getting appropriate urologic f/u.

## 2012-09-11 NOTE — Assessment & Plan Note (Signed)
Problem stable.  Continue current medications and diet appropriate for this condition.  We have reviewed our general long term plan for this problem and also reviewed symptoms and signs that should prompt the patient to call or return to the office.  

## 2012-09-11 NOTE — Assessment & Plan Note (Signed)
Problem stable.  Continue current medications and diet appropriate for this condition.  We have reviewed our general long term plan for this problem and also reviewed symptoms and signs that should prompt the patient to call or return to the office. Check Cr/lytes.

## 2012-09-16 ENCOUNTER — Other Ambulatory Visit: Payer: Self-pay | Admitting: *Deleted

## 2012-09-16 MED ORDER — LISINOPRIL 10 MG PO TABS: 10.0000 mg | ORAL_TABLET | Freq: Every day | ORAL | Status: DC

## 2012-09-16 NOTE — Telephone Encounter (Signed)
Faxed refill request received from pharmacy for LISINOPRIL  Last filled by MD on 03/24/12, #90 X 1 Last seen on 09/11/12 Refill sent per [facility] refill protocol.

## 2012-10-05 ENCOUNTER — Other Ambulatory Visit: Payer: Self-pay | Admitting: *Deleted

## 2012-10-05 MED ORDER — ATORVASTATIN CALCIUM 10 MG PO TABS: 10.0000 mg | ORAL_TABLET | Freq: Every day | ORAL | Status: DC

## 2012-10-05 NOTE — Telephone Encounter (Signed)
ERX failed.  RX resent.

## 2012-10-05 NOTE — Telephone Encounter (Signed)
Faxed refill request received from pharmacy for ATORVASTATIN Last filled by MD on 04/07/12, #30 X 5 Last seen on 09/11/12 Follow up 6 MONTHS Refill sent per [facility] refill protocol.

## 2013-02-12 ENCOUNTER — Other Ambulatory Visit: Payer: Self-pay | Admitting: *Deleted

## 2013-02-12 MED ORDER — LEVOTHYROXINE SODIUM 125 MCG PO TABS: 125.0000 ug | ORAL_TABLET | Freq: Every day | ORAL | Status: DC

## 2013-02-12 NOTE — Telephone Encounter (Signed)
Faxed refill request received from pharmacy for LEVOTHYROXINE Last filled by MD on 08/19/12, #90 X 1 Last seen on 09/21/12 Follow up 03/11/13 RX SENT.

## 2013-02-16 ENCOUNTER — Encounter: Payer: Self-pay | Admitting: Family Medicine

## 2013-03-03 ENCOUNTER — Other Ambulatory Visit (INDEPENDENT_AMBULATORY_CARE_PROVIDER_SITE_OTHER): Payer: Federal, State, Local not specified - PPO

## 2013-03-03 DIAGNOSIS — Z0000 Encounter for general adult medical examination without abnormal findings: Secondary | ICD-10-CM

## 2013-03-03 LAB — CBC WITH DIFFERENTIAL/PLATELET
Eosinophils Relative: 5.3 % — ABNORMAL HIGH (ref 0.0–5.0)
HCT: 45.6 % (ref 39.0–52.0)
Hemoglobin: 15.5 g/dL (ref 13.0–17.0)
Lymphs Abs: 1.8 10*3/uL (ref 0.7–4.0)
Monocytes Relative: 10 % (ref 3.0–12.0)
Platelets: 188 10*3/uL (ref 150.0–400.0)
WBC: 5.8 10*3/uL (ref 4.5–10.5)

## 2013-03-03 LAB — COMPREHENSIVE METABOLIC PANEL
ALT: 16 U/L (ref 0–53)
Albumin: 3.7 g/dL (ref 3.5–5.2)
CO2: 28 mEq/L (ref 19–32)
Calcium: 8.6 mg/dL (ref 8.4–10.5)
Chloride: 109 mEq/L (ref 96–112)
GFR: 88.15 mL/min (ref >60.00)
Glucose, Bld: 90 mg/dL (ref 70–99)
Potassium: 4 mEq/L (ref 3.5–5.1)
Sodium: 141 mEq/L (ref 135–145)
Total Protein: 6.4 g/dL (ref 6.0–8.3)

## 2013-03-03 LAB — LIPID PANEL: Total CHOL/HDL Ratio: 3

## 2013-03-03 LAB — TSH: TSH: 0.41 u[IU]/mL (ref 0.35–5.50)

## 2013-03-03 NOTE — Progress Notes (Signed)
Labs only

## 2013-03-04 ENCOUNTER — Other Ambulatory Visit: Payer: Federal, State, Local not specified - PPO

## 2013-03-05 ENCOUNTER — Encounter: Payer: Self-pay | Admitting: *Deleted

## 2013-03-11 ENCOUNTER — Ambulatory Visit (INDEPENDENT_AMBULATORY_CARE_PROVIDER_SITE_OTHER): Payer: Federal, State, Local not specified - PPO | Admitting: Family Medicine

## 2013-03-11 ENCOUNTER — Encounter: Payer: Self-pay | Admitting: Family Medicine

## 2013-03-11 ENCOUNTER — Ambulatory Visit: Payer: Federal, State, Local not specified - PPO | Admitting: Family Medicine

## 2013-03-11 VITALS — BP 117/71 | HR 72 | Temp 97.9°F | Resp 16 | Ht 65.5 in | Wt 171.5 lb

## 2013-03-11 DIAGNOSIS — R972 Elevated prostate specific antigen [PSA]: Secondary | ICD-10-CM

## 2013-03-11 DIAGNOSIS — I10 Essential (primary) hypertension: Secondary | ICD-10-CM

## 2013-03-11 DIAGNOSIS — E039 Hypothyroidism, unspecified: Secondary | ICD-10-CM

## 2013-03-11 DIAGNOSIS — E785 Hyperlipidemia, unspecified: Secondary | ICD-10-CM

## 2013-03-11 NOTE — Patient Instructions (Signed)
Try OTC nasal steroid called nasacort: 2 sprays each nostril once daily.

## 2013-03-11 NOTE — Progress Notes (Signed)
OFFICE NOTE  03/11/2013  CC:  Chief Complaint  Patient presents with  . Annual Exam    6-mth F/U     HPI: Patient is a 79 y.o. Caucasian male who is here for 6 mo f/u HTN, hyperlip, hypoth. We reviewed recent labs in detail today: all normal except slightly low but stable HDL. No acute complaints. No BPs from home to report. Reports compliance with meds.   Pertinent PMH:  Past Medical History  Diagnosis Date  . HTN (hypertension)   . Hypothyroidism   . Erectile dysfunction   . Adenomatous colon polyp 2012    Bx 2012 showed tubular adenoma but no HG dysplasia  . Hearing loss of both ears     +hearing aids  . Increased prostate specific antigen (PSA) velocity 11/11/2011    Biopsy negative 03/11/12.  Followed by Dr. Laverle Patter (as of 02/2013, PSA stable and he is to f/u with Dr. Laverle Patter annually.   Past Surgical History  Procedure Laterality Date  . Knee surgery      2009, laperoscopic  . Umbilical hernia repair  1980  . Tonsillectomy  1970    MEDS:  Outpatient Prescriptions Prior to Visit  Medication Sig Dispense Refill  . aspirin EC 81 MG tablet Take 81 mg by mouth daily.        Marland Kitchen atorvastatin (LIPITOR) 10 MG tablet Take 1 tablet (10 mg total) by mouth daily.  30 tablet  5  . calcium carbonate (CALCIUM 600) 600 MG TABS Take 600 mg by mouth daily.        Marland Kitchen levothyroxine (SYNTHROID, LEVOTHROID) 125 MCG tablet Take 1 tablet (125 mcg total) by mouth daily.  90 tablet  1  . lisinopril (PRINIVIL,ZESTRIL) 10 MG tablet Take 1 tablet (10 mg total) by mouth daily.  90 tablet  1  . loratadine (CLARITIN) 10 MG tablet Take 10 mg by mouth daily.        . Multiple Vitamins-Minerals (CENTRUM SILVER PO) Take 1 tablet by mouth daily.        . niacin 500 MG tablet Take 500 mg by mouth daily with breakfast.        . Omega-3 Fatty Acids (FISH OIL) 1000 MG CAPS Take 1 capsule by mouth daily.        . Psyllium (METAMUCIL PO) Take by mouth daily.         No facility-administered medications prior  to visit.    PE: Blood pressure 117/71, pulse 72, temperature 97.9 F (36.6 C), temperature source Oral, resp. rate 16, height 5' 5.5" (1.664 m), weight 171 lb 8 oz (77.792 kg), SpO2 96.00%. Gen: Alert, well appearing.  Patient is oriented to person, place, time, and situation. AFFECT: pleasant, lucid thought and speech. ENT: Ears: EACs clear, normal epithelium.  TMs with good light reflex and landmarks bilaterally.  Eyes: no injection, icteris, swelling, or exudate.  EOMI, PERRLA. Nose: no drainage or turbinate edema/swelling.  No injection or focal lesion.  Mouth: lips without lesion/swelling.  Oral mucosa pink and moist.  Dentition intact and without obvious caries or gingival swelling.  Oropharynx without erythema, exudate, or swelling.  Neck: supple/nontender.  No LAD, mass, or TM.  Carotid pulses 2+ bilaterally, without bruits. CV: RRR, no m/r/g.   LUNGS: CTA bilat, nonlabored resps, good aeration in all lung fields. ABD: soft, NT, ND, BS normal.  No hepatospenomegaly or mass.  No bruits. EXT: no clubbing, cyanosis, or edema.  Musculoskeletal: no joint swelling, erythema, warmth, or tenderness.  ROM of all joints intact. Skin - no sores or suspicious lesions or rashes or color changes  LABS:  Lab Results  Component Value Date   TSH 0.41 03/03/2013   Lab Results  Component Value Date   WBC 5.8 03/03/2013   HGB 15.5 03/03/2013   HCT 45.6 03/03/2013   MCV 94.8 03/03/2013   PLT 188.0 03/03/2013   Lab Results  Component Value Date   CREATININE 0.9 03/03/2013   BUN 17 03/03/2013   NA 141 03/03/2013   K 4.0 03/03/2013   CL 109 03/03/2013   CO2 28 03/03/2013   Lab Results  Component Value Date   ALT 16 03/03/2013   AST 20 03/03/2013   ALKPHOS 55 03/03/2013   BILITOT 0.8 03/03/2013   Lab Results  Component Value Date   CHOL 109 03/03/2013   Lab Results  Component Value Date   HDL 37.80* 03/03/2013   Lab Results  Component Value Date   LDLCALC 60 03/03/2013   Lab Results   Component Value Date   TRIG 56.0 03/03/2013   Lab Results  Component Value Date   CHOLHDL 3 03/03/2013   Lab Results  Component Value Date   PSA 3.94 11/11/2011   PSA 2.61 12/17/2010    IMPRESSION AND PLAN:  HYPERTENSION, BENIGN Problem stable.  Continue current medications and diet appropriate for this condition.  We have reviewed our general long term plan for this problem and also reviewed symptoms and signs that should prompt the patient to call or return to the office.   Hyperlipidemia Problem stable.  Continue current medications and diet appropriate for this condition.  We have reviewed our general long term plan for this problem and also reviewed symptoms and signs that should prompt the patient to call or return to the office.   HYPOTHYROIDISM Problem stable.  Continue current medications and diet appropriate for this condition.  We have reviewed our general long term plan for this problem and also reviewed symptoms and signs that should prompt the patient to call or return to the office.   Increased prostate specific antigen (PSA) velocity Prostate Bx neg. F/u PSAs have been stable. Urology f/u 02/2013 showed PSA slightly lower than last and urologist recommended annual f/u w/PSA.   An After Visit Summary was printed and given to the patient.  FOLLOW UP: 30mo

## 2013-03-17 ENCOUNTER — Encounter: Payer: Self-pay | Admitting: Family Medicine

## 2013-03-17 ENCOUNTER — Telehealth: Payer: Self-pay | Admitting: *Deleted

## 2013-03-17 MED ORDER — LISINOPRIL 10 MG PO TABS: 10.0000 mg | ORAL_TABLET | Freq: Every day | ORAL | Status: DC

## 2013-03-17 NOTE — Assessment & Plan Note (Signed)
Problem stable.  Continue current medications and diet appropriate for this condition.  We have reviewed our general long term plan for this problem and also reviewed symptoms and signs that should prompt the patient to call or return to the office.  

## 2013-03-17 NOTE — Telephone Encounter (Signed)
Rx request to pharmacy/SLS  

## 2013-03-18 NOTE — Assessment & Plan Note (Signed)
Prostate Bx neg. F/u PSAs have been stable. Urology f/u 02/2013 showed PSA slightly lower than last and urologist recommended annual f/u w/PSA.

## 2013-04-01 ENCOUNTER — Other Ambulatory Visit: Payer: Self-pay | Admitting: *Deleted

## 2013-04-01 MED ORDER — ATORVASTATIN CALCIUM 10 MG PO TABS: 10.0000 mg | ORAL_TABLET | Freq: Every day | ORAL | Status: DC

## 2013-04-01 NOTE — Telephone Encounter (Signed)
Rx request to pharmacy/SLS  

## 2013-06-15 ENCOUNTER — Encounter: Payer: Self-pay | Admitting: Family Medicine

## 2013-06-15 ENCOUNTER — Ambulatory Visit (INDEPENDENT_AMBULATORY_CARE_PROVIDER_SITE_OTHER): Payer: Federal, State, Local not specified - PPO | Admitting: Family Medicine

## 2013-06-15 VITALS — BP 126/73 | HR 71 | Temp 98.4°F | Resp 16 | Ht 65.5 in | Wt 172.0 lb

## 2013-06-15 DIAGNOSIS — H60399 Other infective otitis externa, unspecified ear: Secondary | ICD-10-CM

## 2013-06-15 DIAGNOSIS — H6092 Unspecified otitis externa, left ear: Secondary | ICD-10-CM

## 2013-06-15 MED ORDER — NEOMYCIN-POLYMYXIN-HC 3.5-10000-1 OT SOLN: OTIC | Status: DC

## 2013-06-15 NOTE — Addendum Note (Signed)
Addended by: Jeoffrey Massed on: 06/15/2013 09:46 AM   Modules accepted: Level of Service

## 2013-06-15 NOTE — Progress Notes (Addendum)
OFFICE NOTE  06/15/2013  CC:  Chief Complaint  Patient presents with  . Otalgia    Left ear x 1 week off and on     HPI: Patient is a 79 y.o. Hispanic male who is here for left ear pain. Onset about 1 wk ago.  He feels like it is swollen in ear canal--can't put hearing aid in due to pain and swelling.  No fevers, no malaise.  He uses a Q tip and digs deep all the time.  He is not a Counselling psychologist.  No URI lately, no cough, no ST.  Has hearing impairment--chronic and w/out acute change.  No jaw pain.  No loss of energy.  Appetite is good.  Pertinent PMH:  Past Medical History  Diagnosis Date  . HTN (hypertension)   . Hypothyroidism   . Erectile dysfunction   . Adenomatous colon polyp 2012    Bx 2012 showed tubular adenoma but no HG dysplasia  . Hearing loss of both ears     +hearing aids  . Increased prostate specific antigen (PSA) velocity 11/11/2011    Biopsy negative 03/11/12.  Followed by Dr. Laverle Patter (as of 02/2013, PSA stable and he is to f/u with Dr. Laverle Patter annually.   Past surgical, social, and family history reviewed and no changes noted since last office visit.  MEDS:  Outpatient Prescriptions Prior to Visit  Medication Sig Dispense Refill  . aspirin EC 81 MG tablet Take 81 mg by mouth daily.        Marland Kitchen atorvastatin (LIPITOR) 10 MG tablet Take 1 tablet (10 mg total) by mouth daily.  30 tablet  5  . calcium carbonate (CALCIUM 600) 600 MG TABS Take 600 mg by mouth daily.        Marland Kitchen levothyroxine (SYNTHROID, LEVOTHROID) 125 MCG tablet Take 1 tablet (125 mcg total) by mouth daily.  90 tablet  1  . lisinopril (PRINIVIL,ZESTRIL) 10 MG tablet Take 1 tablet (10 mg total) by mouth daily.  90 tablet  1  . loratadine (CLARITIN) 10 MG tablet Take 10 mg by mouth daily.        . Multiple Vitamins-Minerals (CENTRUM SILVER PO) Take 1 tablet by mouth daily.        . niacin 500 MG tablet Take 500 mg by mouth daily with breakfast.        . Omega-3 Fatty Acids (FISH OIL) 1000 MG CAPS Take 1 capsule by  mouth daily.        . Psyllium (METAMUCIL PO) Take by mouth daily.         No facility-administered medications prior to visit.    PE: Blood pressure 126/73, pulse 71, temperature 98.4 F (36.9 C), temperature source Temporal, resp. rate 16, height 5' 5.5" (1.664 m), weight 172 lb (78.019 kg), SpO2 95.00%. Gen: Alert, well appearing.  Patient is oriented to person, place, time, and situation. ENT: Right ear canal and TM normal. Left external ear without swelling or erythema or tenderness.  Insertion of ear speculum into left EAC does hurt, and I see some mild edema of the EAC epithelium, ?trace whitish exudate.  TM viewed and is normal.    IMPRESSION AND PLAN:  Mild otitis externa on left: rx'd cortisporin otic 4 gtts in left ear tid x 10d. Advised patient to d/c use of Q tips or at least use them more gently and only in proximal region of EAC.  FOLLOW UP: prn

## 2013-07-19 ENCOUNTER — Encounter: Payer: Self-pay | Admitting: Family Medicine

## 2013-07-19 ENCOUNTER — Ambulatory Visit (INDEPENDENT_AMBULATORY_CARE_PROVIDER_SITE_OTHER): Payer: Federal, State, Local not specified - PPO | Admitting: Family Medicine

## 2013-07-19 ENCOUNTER — Ambulatory Visit (INDEPENDENT_AMBULATORY_CARE_PROVIDER_SITE_OTHER)
Admission: RE | Admit: 2013-07-19 | Discharge: 2013-07-19 | Disposition: A | Discharge status: Home / Self Care | Payer: Federal, State, Local not specified - PPO | Source: Ambulatory Visit | Attending: Family Medicine | Admitting: Family Medicine

## 2013-07-19 VITALS — BP 128/80 | HR 69 | Temp 98.6°F | Resp 16 | Ht 65.5 in | Wt 170.0 lb

## 2013-07-19 DIAGNOSIS — R059 Cough, unspecified: Secondary | ICD-10-CM

## 2013-07-19 DIAGNOSIS — R509 Fever, unspecified: Secondary | ICD-10-CM

## 2013-07-19 DIAGNOSIS — J989 Respiratory disorder, unspecified: Secondary | ICD-10-CM

## 2013-07-19 DIAGNOSIS — R05 Cough: Secondary | ICD-10-CM

## 2013-07-19 MED ORDER — AZITHROMYCIN 250 MG PO TABS: ORAL_TABLET | ORAL | Status: DC

## 2013-07-19 MED ORDER — HYDROCODONE-HOMATROPINE 5-1.5 MG/5ML PO SYRP: ORAL_SOLUTION | ORAL | Status: DC

## 2013-07-19 NOTE — Assessment & Plan Note (Addendum)
Check CXR today. Will treat for bacterial bronchitis with azithromycin x 5d. Hycodan susp 1-2 tsp q6h prn---primarily hs use encouraged, while using mucinex DM in daytime.

## 2013-07-19 NOTE — Progress Notes (Addendum)
OFFICE NOTE  07/19/2013  CC:  Chief Complaint  Patient presents with  . Cough    yellow phlem  . Eye Burn  . Headache     HPI: Patient is a 79 y.o. Caucasian male who is here for cough.   Onset 4d/a cough, ST, lots of phlegm, hurts in chest when he coughs.  Subjective fever "a little".  HA and eyes burning. Runny nose+, watery.  Today he feels better and he has been taking mucinex DM.  No body aches but feels fatigued. Eating and drinking fine.  No n/v/d.  Pertinent PMH:  Past Medical History  Diagnosis Date  . HTN (hypertension)   . Hypothyroidism   . Erectile dysfunction   . Adenomatous colon polyp 2012    Bx 2012 showed tubular adenoma but no HG dysplasia  . Hearing loss of both ears     +hearing aids  . Increased prostate specific antigen (PSA) velocity 11/11/2011    Biopsy negative 03/11/12.  Followed by Dr. Laverle Patter (as of 02/2013, PSA stable and he is to f/u with Dr. Laverle Patter annually.   Past surgical, social, and family history reviewed and no changes noted since last office visit.  MEDS:  Outpatient Prescriptions Prior to Visit  Medication Sig Dispense Refill  . aspirin EC 81 MG tablet Take 81 mg by mouth daily.        Marland Kitchen atorvastatin (LIPITOR) 10 MG tablet Take 1 tablet (10 mg total) by mouth daily.  30 tablet  5  . calcium carbonate (CALCIUM 600) 600 MG TABS Take 600 mg by mouth daily.        Marland Kitchen levothyroxine (SYNTHROID, LEVOTHROID) 125 MCG tablet Take 1 tablet (125 mcg total) by mouth daily.  90 tablet  1  . lisinopril (PRINIVIL,ZESTRIL) 10 MG tablet Take 1 tablet (10 mg total) by mouth daily.  90 tablet  1  . loratadine (CLARITIN) 10 MG tablet Take 10 mg by mouth daily.        . Multiple Vitamins-Minerals (CENTRUM SILVER PO) Take 1 tablet by mouth daily.        . niacin 500 MG tablet Take 500 mg by mouth daily with breakfast.        . Omega-3 Fatty Acids (FISH OIL) 1000 MG CAPS Take 1 capsule by mouth daily.        . Psyllium (METAMUCIL PO) Take by mouth daily.         Marland Kitchen neomycin-polymyxin-hydrocortisone (CORTISPORIN) otic solution 4 gtts in left ear tid x 10d  10 mL  1   No facility-administered medications prior to visit.    PE: Blood pressure 128/80, pulse 69, temperature 98.6 F (37 C), temperature source Temporal, resp. rate 16, height 5' 5.5" (1.664 m), weight 170 lb (77.111 kg), SpO2 97.00%. Gen: alert, tired appearing and moves a bit slowly to exam table.  NONTOXIC-appearing.  Oriented x 4. HEENT: eyes without injection, drainage, or swelling.  Ears: EACs clear, TMs with normal light reflex and landmarks.  Nose: Clear rhinorrhea, with some dried, crusty exudate adherent to mildly injected mucosa.  No purulent d/c.  No paranasal sinus TTP.  No facial swelling.  Throat and mouth without focal lesion.  No pharyngial swelling, erythema, or exudate.   Neck: supple, no LAD.   LUNGS: CTA bilat except some fine insp crackles in left post axillary line --lingular area, nonlabored resps.  Question of mild bronchial breath sounds in right base.   CV: RRR, no m/r/g. EXT: no c/c/e SKIN: no  rash  LABS: CXR pending  IMPRESSION AND PLAN:  Febrile respiratory illness Check CXR today. Will treat for bacterial bronchitis with azithromycin x 5d. Hycodan susp 1-2 tsp q6h prn---primarily hs use encouraged, while using mucinex DM in daytime.   An After Visit Summary was printed and given to the patient.  FOLLOW UP: prn   ADDENDUM 5:05 pm 07/19/13: CXR showed no pneumonia.  Pt notified.--PM

## 2013-08-24 ENCOUNTER — Other Ambulatory Visit: Payer: Self-pay | Admitting: Family Medicine

## 2013-08-24 MED ORDER — LEVOTHYROXINE SODIUM 125 MCG PO TABS: 125.0000 ug | ORAL_TABLET | Freq: Every day | ORAL | Status: DC

## 2013-09-10 ENCOUNTER — Encounter: Payer: Self-pay | Admitting: Family Medicine

## 2013-09-10 ENCOUNTER — Ambulatory Visit (INDEPENDENT_AMBULATORY_CARE_PROVIDER_SITE_OTHER): Payer: Federal, State, Local not specified - PPO | Admitting: Family Medicine

## 2013-09-10 VITALS — BP 131/77 | HR 56 | Temp 97.2°F | Resp 16 | Ht 65.0 in | Wt 173.0 lb

## 2013-09-10 DIAGNOSIS — I10 Essential (primary) hypertension: Secondary | ICD-10-CM

## 2013-09-10 DIAGNOSIS — Z23 Encounter for immunization: Secondary | ICD-10-CM

## 2013-09-10 DIAGNOSIS — E785 Hyperlipidemia, unspecified: Secondary | ICD-10-CM

## 2013-09-10 DIAGNOSIS — E039 Hypothyroidism, unspecified: Secondary | ICD-10-CM

## 2013-09-10 NOTE — Progress Notes (Signed)
OFFICE NOTE  09/10/2013  CC:  Chief Complaint  Patient presents with  . Follow-up     HPI: Patient is a 79 y.o. Caucasian male who is here for 6 mo f/u HTN, hypothyroidism, and hyperlipidemia. He feels great.  All respiratory sx's from recent illness are gone. Compliant with all meds, no side effects. No home bp's to report.  Pertinent PMH:  Past Medical History  Diagnosis Date  . HTN (hypertension)   . Hypothyroidism   . Erectile dysfunction   . Adenomatous colon polyp 2012    Bx 2012 showed tubular adenoma but no HG dysplasia  . Hearing loss of both ears     +hearing aids  . Increased prostate specific antigen (PSA) velocity 11/11/2011    Biopsy negative 03/11/12.  Followed by Dr. Laverle Patter (as of 02/2013, PSA stable and he is to f/u with Dr. Laverle Patter annually.    MEDS:  Outpatient Prescriptions Prior to Visit  Medication Sig Dispense Refill  . aspirin EC 81 MG tablet Take 81 mg by mouth daily.        Marland Kitchen atorvastatin (LIPITOR) 10 MG tablet Take 1 tablet (10 mg total) by mouth daily.  30 tablet  5  . calcium carbonate (CALCIUM 600) 600 MG TABS Take 600 mg by mouth daily.        Marland Kitchen levothyroxine (SYNTHROID, LEVOTHROID) 125 MCG tablet Take 1 tablet (125 mcg total) by mouth daily.  90 tablet  0  . lisinopril (PRINIVIL,ZESTRIL) 10 MG tablet Take 1 tablet (10 mg total) by mouth daily.  90 tablet  1  . loratadine (CLARITIN) 10 MG tablet Take 10 mg by mouth daily.        . Multiple Vitamins-Minerals (CENTRUM SILVER PO) Take 1 tablet by mouth daily.        Marland Kitchen neomycin-polymyxin-hydrocortisone (CORTISPORIN) otic solution 4 gtts in left ear tid x 10d  10 mL  1  . niacin 500 MG tablet Take 500 mg by mouth daily with breakfast.        . Omega-3 Fatty Acids (FISH OIL) 1000 MG CAPS Take 1 capsule by mouth daily.        . Psyllium (METAMUCIL PO) Take by mouth daily.        Marland Kitchen azithromycin (ZITHROMAX) 250 MG tablet 2 tabs po qd x 1d, then 1 tab po qd x 4d  6 each  0  . HYDROcodone-homatropine  (HYCODAN) 5-1.5 MG/5ML syrup 1-2 tsp po q6h prn cough  120 mL  0   No facility-administered medications prior to visit.    PE: Blood pressure 131/77, pulse 56, temperature 97.2 F (36.2 C), temperature source Temporal, resp. rate 16, height 5\' 5"  (1.651 m), weight 173 lb (78.472 kg), SpO2 98.00%. Gen: Alert, well appearing.  Patient is oriented to person, place, time, and situation. ENT:  Eyes: no injection, icteris, swelling, or exudate.  EOMI, PERRLA. Nose: no drainage or turbinate edema/swelling.  No injection or focal lesion.  Mouth: lips without lesion/swelling.  Oral mucosa pink and moist.  Dentition intact and without obvious caries or gingival swelling.  Oropharynx without erythema, exudate, or swelling.  Neck: supple/nontender.  No LAD, mass, or TM.  Carotid pulses 2+ bilaterally, without bruits. CV: RRR, no m/r/g.   LUNGS: CTA bilat, nonlabored resps, good aeration in all lung fields. EXT: no clubbing, cyanosis, or edema.   LABS: none today    Chemistry      Component Value Date/Time   NA 141 03/03/2013 0805   K  4.0 03/03/2013 0805   CL 109 03/03/2013 0805   CO2 28 03/03/2013 0805   BUN 17 03/03/2013 0805   CREATININE 0.9 03/03/2013 0805      Component Value Date/Time   CALCIUM 8.6 03/03/2013 0805   ALKPHOS 55 03/03/2013 0805   AST 20 03/03/2013 0805   ALT 16 03/03/2013 0805   BILITOT 0.8 03/03/2013 0805     Lab Results  Component Value Date   TSH 0.41 03/03/2013   Lab Results  Component Value Date   CHOL 109 03/03/2013   HDL 37.80* 03/03/2013   LDLCALC 60 03/03/2013   TRIG 56.0 03/03/2013   CHOLHDL 3 03/03/2013      IMPRESSION AND PLAN:  1) HTN.  The current medical regimen is effective;  continue present plan and medications.  2) Hypothyroidism.  The current medical regimen is effective;  continue present plan and medications.  3) Hyperlipidemia.  The current medical regimen is effective;  continue present plan and medications.   FOLLOW UP: 18mo for CPE, labs the  week prior.

## 2013-09-13 ENCOUNTER — Other Ambulatory Visit: Payer: Self-pay | Admitting: Family Medicine

## 2013-09-13 MED ORDER — LISINOPRIL 10 MG PO TABS: 10.0000 mg | ORAL_TABLET | Freq: Every day | ORAL | Status: DC

## 2013-10-06 ENCOUNTER — Other Ambulatory Visit: Payer: Self-pay | Admitting: *Deleted

## 2013-10-06 MED ORDER — ATORVASTATIN CALCIUM 10 MG PO TABS: 10.0000 mg | ORAL_TABLET | Freq: Every day | ORAL | Status: DC

## 2013-11-04 DIAGNOSIS — R911 Solitary pulmonary nodule: Secondary | ICD-10-CM

## 2013-11-04 HISTORY — DX: Solitary pulmonary nodule: R91.1

## 2013-11-22 ENCOUNTER — Other Ambulatory Visit: Payer: Self-pay | Admitting: Family Medicine

## 2013-11-22 MED ORDER — LEVOTHYROXINE SODIUM 125 MCG PO TABS: 125.0000 ug | ORAL_TABLET | Freq: Every day | ORAL | Status: DC

## 2014-02-21 ENCOUNTER — Encounter: Payer: Self-pay | Admitting: Family Medicine

## 2014-03-03 ENCOUNTER — Other Ambulatory Visit (INDEPENDENT_AMBULATORY_CARE_PROVIDER_SITE_OTHER): Payer: Federal, State, Local not specified - PPO

## 2014-03-03 DIAGNOSIS — Z0000 Encounter for general adult medical examination without abnormal findings: Secondary | ICD-10-CM

## 2014-03-03 LAB — COMPREHENSIVE METABOLIC PANEL
ALT: 17 U/L (ref 0–53)
AST: 19 U/L (ref 0–37)
Albumin: 3.7 g/dL (ref 3.5–5.2)
Alkaline Phosphatase: 59 U/L (ref 39–117)
BUN: 21 mg/dL (ref 6–23)
CALCIUM: 8.8 mg/dL (ref 8.4–10.5)
CHLORIDE: 110 meq/L (ref 96–112)
CO2: 28 meq/L (ref 19–32)
Creatinine, Ser: 0.9 mg/dL (ref 0.4–1.5)
GFR: 86.8 mL/min (ref >60.00)
Glucose, Bld: 99 mg/dL (ref 70–99)
Potassium: 4.5 mEq/L (ref 3.5–5.1)
SODIUM: 142 meq/L (ref 135–145)
TOTAL PROTEIN: 6.1 g/dL (ref 6.0–8.3)
Total Bilirubin: 0.9 mg/dL (ref 0.3–1.2)

## 2014-03-03 LAB — CBC WITH DIFFERENTIAL/PLATELET
Basophils Absolute: 0 10*3/uL (ref 0.0–0.1)
Basophils Relative: 0.3 % (ref 0.0–3.0)
Eosinophils Absolute: 0.3 10*3/uL (ref 0.0–0.7)
Eosinophils Relative: 5.4 % — ABNORMAL HIGH (ref 0.0–5.0)
HCT: 44.5 % (ref 39.0–52.0)
HEMOGLOBIN: 15 g/dL (ref 13.0–17.0)
LYMPHS PCT: 28 % (ref 12.0–46.0)
Lymphs Abs: 1.7 10*3/uL (ref 0.7–4.0)
MCHC: 33.8 g/dL (ref 30.0–36.0)
MCV: 97 fl (ref 78.0–100.0)
MONOS PCT: 10.1 % (ref 3.0–12.0)
Monocytes Absolute: 0.6 10*3/uL (ref 0.1–1.0)
Neutro Abs: 3.5 10*3/uL (ref 1.4–7.7)
Neutrophils Relative %: 56.2 % (ref 43.0–77.0)
PLATELETS: 198 10*3/uL (ref 150.0–400.0)
RBC: 4.58 Mil/uL (ref 4.22–5.81)
RDW: 13.9 % (ref 11.5–14.6)
WBC: 6.2 10*3/uL (ref 4.5–10.5)

## 2014-03-03 LAB — TSH: TSH: 0.42 u[IU]/mL (ref 0.35–5.50)

## 2014-03-03 LAB — LIPID PANEL
Cholesterol: 116 mg/dL (ref 0–200)
HDL: 33.1 mg/dL — ABNORMAL LOW (ref >39.00)
LDL Cholesterol: 71 mg/dL (ref 0–99)
Total CHOL/HDL Ratio: 4
Triglycerides: 61 mg/dL (ref 0.0–149.0)
VLDL: 12.2 mg/dL (ref 0.0–40.0)

## 2014-03-10 ENCOUNTER — Ambulatory Visit (INDEPENDENT_AMBULATORY_CARE_PROVIDER_SITE_OTHER): Payer: Federal, State, Local not specified - PPO | Admitting: Family Medicine

## 2014-03-10 ENCOUNTER — Encounter: Payer: Self-pay | Admitting: Family Medicine

## 2014-03-10 VITALS — BP 131/76 | HR 58 | Temp 98.0°F | Resp 18 | Ht 65.0 in | Wt 172.0 lb

## 2014-03-10 DIAGNOSIS — Z23 Encounter for immunization: Secondary | ICD-10-CM

## 2014-03-10 DIAGNOSIS — Z0000 Encounter for general adult medical examination without abnormal findings: Secondary | ICD-10-CM

## 2014-03-10 NOTE — Progress Notes (Signed)
Pre visit review using our clinic review tool, if applicable. No additional management support is needed unless otherwise documented below in the visit note. 

## 2014-03-10 NOTE — Progress Notes (Signed)
Office Note 03/10/2014  CC:  Chief Complaint  Patient presents with  . Annual Exam    HPI:  Joel Kelley is a 80 y.o. [place]  male who is here for routine CPE- f/u HTN, hyperlipidemia, hypothyroidism, ED, hx of increased PSA velocity.  He had all of his upper teeth pulled recently, took ibuprofen temporarily. Has false teeth on top now. He is trying to walk daily, also very busy with 60 y/o grandson playing sports in yard, going to park, etc. He is trying to eat a low Na, low chol/low fat diet.      Past Medical History  Diagnosis Date  . HTN (hypertension)   . Hypothyroidism   . Erectile dysfunction   . Adenomatous colon polyp 2012    Bx 2012 showed tubular adenoma but no HG dysplasia  . Hearing loss of both ears     +hearing aids  . Increased prostate specific antigen (PSA) velocity 11/11/2011    Biopsy negative 03/11/12.  Followed by Dr. Alinda Money (mild rise in PSA 2015, but Dr. Alinda Money and pt agreed to continue with annual PSA/DRE, no invasive procedure).    Past Surgical History  Procedure Laterality Date  . Knee surgery      2009, laperoscopic  . Umbilical hernia repair  1980  . Tonsillectomy  1970    History reviewed. No pertinent family history.  History   Social History  . Marital Status: Married    Spouse Name: [name]    Number of Children: N/A  . Years of Education: N/A   Occupational History  . Not on file.   Social History Main Topics  . Smoking status: Former Smoker    Types: Cigarettes  . Smokeless tobacco: Never Used     Comment: quit 20+ years ago  . Alcohol Use: No     Comment: None in 18 years  . Drug Use: No  . Sexual Activity: Not on file   Other Topics Concern  . Not on file   Social History Narrative   Divorced, remarried, one son and one step daughter (step-daughter is local), 2 grandchildren.   Relocated from [place] about 2010.   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   Distant tobacco abuse: avg 5 cigs day for 20 yrs on and off, quit  for good around 1990.   No alcohol.   Daily walking at the park.    Outpatient Prescriptions Prior to Visit  Medication Sig Dispense Refill  . aspirin EC 81 MG tablet Take 81 mg by mouth daily.        Marland Kitchen atorvastatin (LIPITOR) 10 MG tablet Take 1 tablet (10 mg total) by mouth daily.  30 tablet  5  . calcium carbonate (CALCIUM 600) 600 MG TABS Take 600 mg by mouth daily.        Marland Kitchen levothyroxine (SYNTHROID, LEVOTHROID) 125 MCG tablet Take 1 tablet (125 mcg total) by mouth daily.  90 tablet  1  . lisinopril (PRINIVIL,ZESTRIL) 10 MG tablet Take 1 tablet (10 mg total) by mouth daily.  90 tablet  1  . loratadine (CLARITIN) 10 MG tablet Take 10 mg by mouth daily.        . Multiple Vitamins-Minerals (CENTRUM SILVER PO) Take 1 tablet by mouth daily.        Marland Kitchen neomycin-polymyxin-hydrocortisone (CORTISPORIN) otic solution 4 gtts in left ear tid x 10d  10 mL  1  . niacin 500 MG tablet Take 500 mg by mouth daily with breakfast.        .  Omega-3 Fatty Acids (FISH OIL) 1000 MG CAPS Take 1 capsule by mouth daily.        . Psyllium (METAMUCIL PO) Take by mouth daily.         No facility-administered medications prior to visit.    No Known Allergies  ROS Review of Systems  Constitutional: Negative for fever, chills, appetite change and fatigue.  HENT: Negative for congestion, dental problem, ear pain and sore throat.   Eyes: Negative for discharge, redness and visual disturbance.  Respiratory: Negative for cough, chest tightness, shortness of breath and wheezing.   Cardiovascular: Negative for chest pain, palpitations and leg swelling.  Gastrointestinal: Negative for nausea, vomiting, abdominal pain, diarrhea and blood in stool.  Genitourinary: Negative for dysuria, urgency, frequency, hematuria, flank pain and difficulty urinating.  Musculoskeletal: Negative for arthralgias, back pain, joint swelling, myalgias and neck stiffness.  Skin: Negative for pallor and rash.  Neurological: Negative for  dizziness, speech difficulty, weakness and headaches.  Hematological: Negative for adenopathy. Does not bruise/bleed easily.  Psychiatric/Behavioral: Negative for confusion and sleep disturbance. The patient is not nervous/anxious.     PE; Blood pressure 131/76, pulse 58, temperature 98 F (36.7 C), temperature source Temporal, resp. rate 18, height 5\' 5"  (1.651 m), weight 172 lb (78.019 kg), SpO2 97.00%. Gen: Alert, well appearing.  Patient is oriented to person, place, time, and situation. AFFECT: pleasant, lucid thought and speech. ENT: Ears: EACs clear, normal epithelium.  TMs with good light reflex and landmarks bilaterally.  Eyes: no injection, icteris, swelling, or exudate.  EOMI, PERRLA. Nose: no drainage or turbinate edema/swelling.  No injection or focal lesion.  Mouth: lips without lesion/swelling.  Oral mucosa pink and moist.  Dentition intact and without obvious caries or gingival swelling.  Oropharynx without erythema, exudate, or swelling.  Neck: supple/nontender.  No LAD, mass, or TM.  Carotid pulses 2+ bilaterally, without bruits. CV: RRR, no m/r/g.   LUNGS: CTA bilat, nonlabored resps, good aeration in all lung fields. ABD: soft, NT, ND, BS normal.  No hepatospenomegaly or mass.  No bruits. EXT: no clubbing, cyanosis, or edema.  Musculoskeletal: no joint swelling, erythema, warmth, or tenderness.  ROM of all joints intact. Skin - no sores or suspicious lesions or rashes or color changes   Pertinent labs:  Lab Results  Component Value Date   TSH 0.42 03/03/2014   Lab Results  Component Value Date   WBC 6.2 03/03/2014   HGB 15.0 03/03/2014   HCT 44.5 03/03/2014   MCV 97.0 03/03/2014   PLT 198.0 03/03/2014   Lab Results  Component Value Date   CREATININE 0.9 03/03/2014   BUN 21 03/03/2014   NA 142 03/03/2014   K 4.5 03/03/2014   CL 110 03/03/2014   CO2 28 03/03/2014   Lab Results  Component Value Date   ALT 17 03/03/2014   AST 19 03/03/2014   ALKPHOS 59 03/03/2014    BILITOT 0.9 03/03/2014   Lab Results  Component Value Date   CHOL 116 03/03/2014   Lab Results  Component Value Date   HDL 33.10* 03/03/2014   Lab Results  Component Value Date   LDLCALC 71 03/03/2014   Lab Results  Component Value Date   TRIG 61.0 03/03/2014   Lab Results  Component Value Date   CHOLHDL 4 03/03/2014   Lab Results  Component Value Date   PSA 3.94 11/11/2011   PSA 2.61 12/17/2010    ASSESSMENT AND PLAN:   Health maintenance examination Reviewed age and  gender appropriate health maintenance issues (prudent diet, regular exercise, health risks of tobacco and excessive alcohol, use of seatbelts, fire alarms in home, use of sunscreen).  Also reviewed age and gender appropriate health screening as well as vaccine recommendations. Prevnar 13 today. Reviewed recent [facility] labs--all stable. Following PSA issues with Urologist. Next colonoscopy due 2017--f/u adenomatous polyps.   An After Visit Summary was printed and given to the patient.  FOLLOW UP:  Return in about 6 months (around 09/10/2014) for routine chronic illness f/u--no need to fast.

## 2014-03-10 NOTE — Assessment & Plan Note (Signed)
Reviewed age and gender appropriate health maintenance issues (prudent diet, regular exercise, health risks of tobacco and excessive alcohol, use of seatbelts, fire alarms in home, use of sunscreen).  Also reviewed age and gender appropriate health screening as well as vaccine recommendations. Prevnar 13 today. Reviewed recent HP labs--all stable. Following PSA issues with Urologist. Next colonoscopy due 2017--f/u adenomatous polyps.

## 2014-03-11 ENCOUNTER — Other Ambulatory Visit: Payer: Self-pay | Admitting: Family Medicine

## 2014-03-19 ENCOUNTER — Emergency Department
Admission: EM | Admit: 2014-03-19 | Discharge: 2014-03-19 | Disposition: A | Discharge status: Other Acute Inpatient Hospital | Payer: Federal, State, Local not specified - PPO | Source: Home / Self Care | Attending: Family Medicine | Admitting: Family Medicine

## 2014-03-19 ENCOUNTER — Encounter (HOSPITAL_BASED_OUTPATIENT_CLINIC_OR_DEPARTMENT_OTHER): Payer: Self-pay | Admitting: Emergency Medicine

## 2014-03-19 ENCOUNTER — Emergency Department (HOSPITAL_BASED_OUTPATIENT_CLINIC_OR_DEPARTMENT_OTHER): Payer: Medicare (Managed Care)

## 2014-03-19 ENCOUNTER — Inpatient Hospital Stay (HOSPITAL_BASED_OUTPATIENT_CLINIC_OR_DEPARTMENT_OTHER)
Admission: EM | Admit: 2014-03-19 | Discharge: 2014-03-24 | DRG: 193 | Disposition: A | Discharge status: Home / Self Care | Payer: Medicare (Managed Care) | Attending: Internal Medicine | Admitting: Internal Medicine

## 2014-03-19 DIAGNOSIS — J9600 Acute respiratory failure, unspecified whether with hypoxia or hypercapnia: Secondary | ICD-10-CM

## 2014-03-19 DIAGNOSIS — N179 Acute kidney failure, unspecified: Secondary | ICD-10-CM | POA: Diagnosis present

## 2014-03-19 DIAGNOSIS — E860 Dehydration: Secondary | ICD-10-CM | POA: Diagnosis present

## 2014-03-19 DIAGNOSIS — H9190 Unspecified hearing loss, unspecified ear: Secondary | ICD-10-CM | POA: Diagnosis present

## 2014-03-19 DIAGNOSIS — I2510 Atherosclerotic heart disease of native coronary artery without angina pectoris: Secondary | ICD-10-CM | POA: Diagnosis present

## 2014-03-19 DIAGNOSIS — J189 Pneumonia, unspecified organism: Secondary | ICD-10-CM | POA: Diagnosis not present

## 2014-03-19 DIAGNOSIS — Z8249 Family history of ischemic heart disease and other diseases of the circulatory system: Secondary | ICD-10-CM | POA: Diagnosis not present

## 2014-03-19 DIAGNOSIS — Z7982 Long term (current) use of aspirin: Secondary | ICD-10-CM

## 2014-03-19 DIAGNOSIS — N529 Male erectile dysfunction, unspecified: Secondary | ICD-10-CM | POA: Diagnosis present

## 2014-03-19 DIAGNOSIS — I10 Essential (primary) hypertension: Secondary | ICD-10-CM

## 2014-03-19 DIAGNOSIS — R059 Cough, unspecified: Secondary | ICD-10-CM | POA: Diagnosis not present

## 2014-03-19 DIAGNOSIS — Z87891 Personal history of nicotine dependence: Secondary | ICD-10-CM | POA: Diagnosis not present

## 2014-03-19 DIAGNOSIS — Z860100 Personal history of colon polyps, unspecified: Secondary | ICD-10-CM

## 2014-03-19 DIAGNOSIS — Z79899 Other long term (current) drug therapy: Secondary | ICD-10-CM | POA: Diagnosis not present

## 2014-03-19 DIAGNOSIS — E039 Hypothyroidism, unspecified: Secondary | ICD-10-CM | POA: Diagnosis present

## 2014-03-19 DIAGNOSIS — J4489 Other specified chronic obstructive pulmonary disease: Secondary | ICD-10-CM | POA: Diagnosis present

## 2014-03-19 DIAGNOSIS — J449 Chronic obstructive pulmonary disease, unspecified: Secondary | ICD-10-CM | POA: Diagnosis present

## 2014-03-19 DIAGNOSIS — Z66 Do not resuscitate: Secondary | ICD-10-CM | POA: Diagnosis present

## 2014-03-19 DIAGNOSIS — R05 Cough: Secondary | ICD-10-CM | POA: Diagnosis not present

## 2014-03-19 DIAGNOSIS — Z0000 Encounter for general adult medical examination without abnormal findings: Secondary | ICD-10-CM

## 2014-03-19 DIAGNOSIS — R972 Elevated prostate specific antigen [PSA]: Secondary | ICD-10-CM

## 2014-03-19 DIAGNOSIS — E785 Hyperlipidemia, unspecified: Secondary | ICD-10-CM

## 2014-03-19 LAB — COMPREHENSIVE METABOLIC PANEL
ALT: 10 U/L (ref 0–53)
AST: 13 U/L (ref 0–37)
Albumin: 2.8 g/dL — ABNORMAL LOW (ref 3.5–5.2)
Alkaline Phosphatase: 51 U/L (ref 39–117)
BUN: 30 mg/dL — AB (ref 6–23)
CALCIUM: 8.4 mg/dL (ref 8.4–10.5)
CO2: 24 meq/L (ref 19–32)
Chloride: 100 mEq/L (ref 96–112)
Creatinine, Ser: 1.5 mg/dL — ABNORMAL HIGH (ref 0.50–1.35)
GFR, EST AFRICAN AMERICAN: 50 mL/min — AB (ref >90)
GFR, EST NON AFRICAN AMERICAN: 43 mL/min — AB (ref >90)
GLUCOSE: 131 mg/dL — AB (ref 70–99)
Potassium: 3.6 mEq/L — ABNORMAL LOW (ref 3.7–5.3)
Sodium: 137 mEq/L (ref 137–147)
Total Bilirubin: 1.3 mg/dL — ABNORMAL HIGH (ref 0.3–1.2)
Total Protein: 6 g/dL (ref 6.0–8.3)

## 2014-03-19 LAB — CBC WITH DIFFERENTIAL/PLATELET
BAND NEUTROPHILS: 17 % — AB (ref 0–10)
Basophils Absolute: 0 10*3/uL (ref 0.0–0.1)
Basophils Relative: 0 % (ref 0–1)
EOS PCT: 0 % (ref 0–5)
Eosinophils Absolute: 0 10*3/uL (ref 0.0–0.7)
HCT: 42.5 % (ref 39.0–52.0)
Hemoglobin: 14.8 g/dL (ref 13.0–17.0)
Lymphocytes Relative: 7 % — ABNORMAL LOW (ref 12–46)
Lymphs Abs: 1.1 10*3/uL (ref 0.7–4.0)
MCH: 33.4 pg (ref 26.0–34.0)
MCHC: 34.8 g/dL (ref 30.0–36.0)
MCV: 95.9 fL (ref 78.0–100.0)
Monocytes Absolute: 0.6 10*3/uL (ref 0.1–1.0)
Monocytes Relative: 4 % (ref 3–12)
NEUTROS ABS: 13.4 10*3/uL — AB (ref 1.7–7.7)
Neutrophils Relative %: 72 % (ref 43–77)
Platelets: 151 10*3/uL (ref 150–400)
RBC: 4.43 MIL/uL (ref 4.22–5.81)
RDW: 12.9 % (ref 11.5–15.5)
WBC: 15.1 10*3/uL — ABNORMAL HIGH (ref 4.0–10.5)

## 2014-03-19 LAB — URINALYSIS, ROUTINE W REFLEX MICROSCOPIC
Bilirubin Urine: NEGATIVE
Glucose, UA: NEGATIVE mg/dL
Hgb urine dipstick: NEGATIVE
Ketones, ur: NEGATIVE mg/dL
Leukocytes, UA: NEGATIVE
NITRITE: NEGATIVE
Protein, ur: NEGATIVE mg/dL
Specific Gravity, Urine: 1.017 (ref 1.005–1.030)
UROBILINOGEN UA: 1 mg/dL (ref 0.0–1.0)
pH: 5.5 (ref 5.0–8.0)

## 2014-03-19 LAB — CBC
HCT: 39.9 % (ref 39.0–52.0)
Hemoglobin: 13.6 g/dL (ref 13.0–17.0)
MCH: 32.3 pg (ref 26.0–34.0)
MCHC: 34.1 g/dL (ref 30.0–36.0)
MCV: 94.8 fL (ref 78.0–100.0)
Platelets: 145 10*3/uL — ABNORMAL LOW (ref 150–400)
RBC: 4.21 MIL/uL — AB (ref 4.22–5.81)
RDW: 13 % (ref 11.5–15.5)
WBC: 13.9 10*3/uL — AB (ref 4.0–10.5)

## 2014-03-19 MED ORDER — ATORVASTATIN CALCIUM 10 MG PO TABS
10.0000 mg | ORAL_TABLET | Freq: Every day | ORAL | Status: DC
  Administered 2014-03-20 – 2014-03-24 (×5): 10 mg via ORAL
  Filled 2014-03-19 (×5): qty 1

## 2014-03-19 MED ORDER — LEVOTHYROXINE SODIUM 125 MCG PO TABS
125.0000 ug | ORAL_TABLET | Freq: Every day | ORAL | Status: DC
  Administered 2014-03-20 – 2014-03-24 (×5): 125 ug via ORAL
  Filled 2014-03-19 (×6): qty 1

## 2014-03-19 MED ORDER — AZITHROMYCIN 500 MG IV SOLR
500.0000 mg | INTRAVENOUS | Status: DC
  Administered 2014-03-19: 500 mg via INTRAVENOUS

## 2014-03-19 MED ORDER — ASPIRIN EC 81 MG PO TBEC
81.0000 mg | DELAYED_RELEASE_TABLET | Freq: Every day | ORAL | Status: DC
  Administered 2014-03-20 – 2014-03-24 (×5): 81 mg via ORAL
  Filled 2014-03-19 (×5): qty 1

## 2014-03-19 MED ORDER — ONDANSETRON HCL 4 MG/2ML IJ SOLN
4.0000 mg | Freq: Once | INTRAMUSCULAR | Status: DC
  Filled 2014-03-19: qty 2

## 2014-03-19 MED ORDER — DEXTROSE 5 % IV SOLN
1.0000 g | INTRAVENOUS | Status: DC
  Administered 2014-03-20 – 2014-03-24 (×5): 1 g via INTRAVENOUS
  Filled 2014-03-19 (×6): qty 10

## 2014-03-19 MED ORDER — ENOXAPARIN SODIUM 40 MG/0.4ML ~~LOC~~ SOLN
40.0000 mg | Freq: Every day | SUBCUTANEOUS | Status: DC
  Administered 2014-03-20 – 2014-03-24 (×5): 40 mg via SUBCUTANEOUS
  Filled 2014-03-19 (×5): qty 0.4

## 2014-03-19 MED ORDER — SODIUM CHLORIDE 0.9 % IV SOLN
Freq: Once | INTRAVENOUS | Status: AC
  Administered 2014-03-19: 15:00:00 via INTRAVENOUS

## 2014-03-19 MED ORDER — LORATADINE 10 MG PO TABS
10.0000 mg | ORAL_TABLET | Freq: Every day | ORAL | Status: DC
  Administered 2014-03-20 – 2014-03-24 (×5): 10 mg via ORAL
  Filled 2014-03-19 (×5): qty 1

## 2014-03-19 MED ORDER — DEXTROSE 5 % IV SOLN
1.0000 g | INTRAVENOUS | Status: DC
  Administered 2014-03-19: 1 g via INTRAVENOUS

## 2014-03-19 MED ORDER — CEFTRIAXONE SODIUM 1 G IJ SOLR
INTRAMUSCULAR | Status: AC
  Filled 2014-03-19: qty 10

## 2014-03-19 MED ORDER — SODIUM CHLORIDE 0.9 % IV SOLN
INTRAVENOUS | Status: AC
  Administered 2014-03-20: via INTRAVENOUS

## 2014-03-19 MED ORDER — DEXTROSE 5 % IV SOLN
500.0000 mg | INTRAVENOUS | Status: DC
  Administered 2014-03-20 – 2014-03-23 (×4): 500 mg via INTRAVENOUS
  Filled 2014-03-19 (×5): qty 500

## 2014-03-19 MED ORDER — NIACIN 500 MG PO TABS
500.0000 mg | ORAL_TABLET | Freq: Every day | ORAL | Status: DC
Start: 2014-03-20 — End: 2014-03-24
  Administered 2014-03-20 – 2014-03-24 (×5): 500 mg via ORAL
  Filled 2014-03-19 (×6): qty 1

## 2014-03-19 MED ORDER — ONDANSETRON HCL 4 MG/2ML IJ SOLN
4.0000 mg | Freq: Once | INTRAMUSCULAR | Status: AC
  Administered 2014-03-19: 4 mg via INTRAVENOUS

## 2014-03-19 NOTE — ED Notes (Signed)
C/o generalized weakness, productive cough with colored, bloody phlegm, fever on Thursday, chest pain with coughing, and diarrhea.  Denies nausea, vomiting, diarrhea.

## 2014-03-19 NOTE — ED Provider Notes (Signed)
Patient awake alert normal speech no respiratory distress with recent pulse oximetry on room air normal 95% within the last hour.The patient appears reasonably stabilized for transfer considering the current resources, flow, and capabilities available in the ED at this time, and I doubt any other [facility] requiring further screening and/or treatment in the ED prior to transfer. [place], MD 03/20/14 [phone]

## 2014-03-19 NOTE — H&P (Signed)
PCP:  Tammi Sou, MD    Chief Complaint:  Cough and fever  HPI: Joel Kelley is a 80 y.o. male   has a past medical history of HTN (hypertension); Hypothyroidism; Erectile dysfunction; Adenomatous colon polyp (2012); Hearing loss of both ears; and Increased prostate specific antigen (PSA) velocity (11/11/2011).   Presented with  4 day hx of fevers and cough productive of yellow and sometimes rusty red phlegm. Chest pain with coughing only. He had one day of diarhea that has now resolved. patient presented to [facility] and was diagnosed with CAD he was slightly hypoxic and was transfered to [facility] for further treatment and IV antibiotics. Endorses poor PO intake although have been drinking. His Cr went up to 1.5 from baseline of 0.9  Hospitalist was called for admission for CAP   Review of Systems:    Pertinent positives include: Fevers, chills, fatigue, poor appetite,  chest pain, productive cough,  wheezing.  Constitutional:  No weight loss, night sweats,  weight loss  HEENT:  No headaches, Difficulty swallowing,Tooth/dental problems,Sore throat,  No sneezing, itching, ear ache, nasal congestion, post nasal drip,  Cardio-vascular:  NoOrthopnea, PND, anasarca, dizziness, palpitations.no Bilateral lower extremity swelling  GI:  No heartburn, indigestion, abdominal pain, nausea, vomiting, diarrhea, change in bowel habits, loss of appetite, melena, blood in stool, hematemesis Resp:  no shortness of breath at rest. No dyspnea on exertion, No excess mucus, no No non-productive cough, No coughing up of blood.No change in color of mucus.No Skin:  no rash or lesions. No jaundice GU:  no dysuria, change in color of urine, no urgency or frequency. No straining to urinate.  No flank pain.  Musculoskeletal:  No joint pain or no joint swelling. No decreased range of motion. No back pain.  Psych:  No change in mood or affect. No depression or anxiety. No memory loss.  Neuro: no localizing  neurological complaints, no tingling, no weakness, no double vision, no gait abnormality, no slurred speech, no confusion  Otherwise ROS are negative except for above, 10 systems were reviewed  Past Medical History: Past Medical History  Diagnosis Date  . HTN (hypertension)   . Hypothyroidism   . Erectile dysfunction   . Adenomatous colon polyp 2012    Bx 2012 showed tubular adenoma but no HG dysplasia  . Hearing loss of both ears     +hearing aids  . Increased prostate specific antigen (PSA) velocity 11/11/2011    Biopsy negative 03/11/12.  Followed by Dr. Alinda Money (mild rise in PSA 2015, but Dr. Alinda Money and pt agreed to continue with annual PSA/DRE, no invasive procedure).   Past Surgical History  Procedure Laterality Date  . Knee surgery      2009, laperoscopic  . Umbilical hernia repair  1980  . Tonsillectomy  1970     Medications: Prior to Admission medications   Medication Sig Start Date End Date Taking? Authorizing Provider  aspirin EC 81 MG tablet Take 81 mg by mouth daily.    Yes Historical Provider, MD  atorvastatin (LIPITOR) 10 MG tablet Take 1 tablet (10 mg total) by mouth daily. 10/06/13 10/06/14 Yes Tammi Sou, MD  calcium carbonate (CALCIUM 600) 600 MG TABS Take 600 mg by mouth daily.    Yes Historical Provider, MD  ibuprofen (ADVIL,MOTRIN) 800 MG tablet Take 800 mg by mouth every 8 (eight) hours as needed for mild pain.   Yes Historical Provider, MD  levothyroxine (SYNTHROID, LEVOTHROID) 125 MCG tablet Take 1 tablet (125 mcg  total) by mouth daily. 11/22/13  Yes Tammi Sou, MD  lisinopril (PRINIVIL,ZESTRIL) 10 MG tablet Take 10 mg by mouth daily.   Yes Historical Provider, MD  loratadine (CLARITIN) 10 MG tablet Take 10 mg by mouth daily.     Yes Historical Provider, MD  Multiple Vitamins-Minerals (CENTRUM SILVER PO) Take 1 tablet by mouth daily.     Yes Historical Provider, MD  niacin 500 MG tablet Take 500 mg by mouth daily with breakfast.    Yes Historical  Provider, MD  Omega-3 Fatty Acids (FISH OIL) 1000 MG CAPS Take 1 capsule by mouth daily.    Yes Historical Provider, MD  Psyllium (METAMUCIL PO) Take 1 scoop by mouth daily.    Yes Historical Provider, MD    Allergies:  No Known Allergies  Social History:  Ambulatory  independently   Lives at home With family     reports that he has quit smoking. His smoking use included Cigarettes. He smoked 0.00 packs per day. He has never used smokeless tobacco. He reports that he does not drink alcohol or use illicit drugs.    Family History: family history includes Hypertension in his father and mother.    Physical Exam: Patient Vitals for the past 24 hrs:  BP Temp Temp src Pulse Resp SpO2 Height Weight  03/19/14 2127 103/64 mmHg 97.9 F (36.6 C) Oral 100 18 92 % - -  03/19/14 1834 - - - - - 93 % - -  03/19/14 1824 121/79 mmHg 99.4 F (37.4 C) Oral 110 18 89 % 5\' 6"  (1.676 m) 78.291 kg (172 lb 9.6 oz)  03/19/14 1659 110/65 mmHg - - 95 16 95 % - -  03/19/14 1318 106/52 mmHg 98.5 F (36.9 C) - 100 20 93 % 5\' 6"  (1.676 m) 78.926 kg (174 lb)    1. General:  in No Acute distress 2. Psychological: Alert and  Oriented 3. Head/ENT:     Dry Mucous Membranes                          Head Non traumatic, neck supple                          Normal Dentition 4. SKIN:   decreased Skin turgor,  Skin clean Dry and intact no rash 5. Heart: Regular rate and rhythm no Murmur, Rub or gallop 6. Lungs:   no wheezes right base crackles  noted 7. Abdomen: Soft, non-tender, Non distended 8. Lower extremities: no clubbing, cyanosis, or edema 9. Neurologically Grossly intact, moving all 4 extremities equally 10. MSK: Normal range of motion  body mass index is 27.87 kg/(m^2).   Labs on Admission:   Recent Labs  03/19/14 1435  NA 137  K 3.6*  CL 100  CO2 24  GLUCOSE 131*  BUN 30*  CREATININE 1.50*  CALCIUM 8.4    Recent Labs  03/19/14 1435  AST 13  ALT 10  ALKPHOS 51  BILITOT 1.3*    PROT 6.0  ALBUMIN 2.8*   No results found for this basename: LIPASE, AMYLASE,  in the last 72 hours  Recent Labs  03/19/14 1435  WBC 15.1*  NEUTROABS 13.4*  HGB 14.8  HCT 42.5  MCV 95.9  PLT 151   No results found for this basename: CKTOTAL, CKMB, CKMBINDEX, TROPONINI,  in the last 72 hours No results found for this basename: TSH, T4TOTAL, FREET3, T3FREE,  THYROIDAB,  in the last 72 hours No results found for this basename: VITAMINB12, FOLATE, FERRITIN, TIBC, IRON, RETICCTPCT,  in the last 72 hours No results found for this basename: HGBA1C    Estimated Creatinine Clearance: 40.6 ml/min (by C-G formula based on Cr of 1.5). ABG No results found for this basename: phart, pco2, po2, hco3, tco2, acidbasedef, o2sat     No results found for this basename: DDIMER     Other results:  UA concentrated  BNP (last 3 results) No results found for this basename: PROBNP,  in the last 8760 hours  Filed Weights   03/19/14 1318 03/19/14 1824  Weight: 78.926 kg (174 lb) 78.291 kg (172 lb 9.6 oz)   Cultures: No results found for this basename: sdes, specrequest, cult, reptstatus     Radiological Exams on Admission: Dg Chest 2 View  03/19/2014   CLINICAL DATA:  Cough and fever  EXAM: CHEST  2 VIEW  COMPARISON:  July 19, 2013  FINDINGS: There is infiltrate in the posterior right base. Elsewhere lungs are clear. Heart size and pulmonary vascularity are normal. No adenopathy. No bone lesions.  IMPRESSION: Infiltrate posterior right base.   Electronically Signed   By: Lowella Grip M.D.   On: 03/19/2014 14:04    Chart has been reviewed  Assessment/Plan  80 yo M with hx of HTN here with CAP and ARF in the setting of likely dehydration  Present on Admission:  . Community acquired pneumonia -  - will admit for treatment of CAP will start on appropriate antibiotic coverage.   Obtain sputum cultures, blood cultures if febrile or if decompensates.  Provide oxygen as needed.  Marland Kitchen  HYPERTENSION, BENIGN - hold lisinopril given ARF . ARF (acute renal failure) - - likely secondary to dehydration, check FeNA and if not improved with IVF would obtain renal US and consider renal consult.    Prophylaxis: lovnox, Protonix  CODE STATUS: DNR/DNI per patient's wishes  Other plan as per orders.  I have spent a total of 55 min on this admission  Joel Kelley 03/19/2014, 9:55 PM

## 2014-03-19 NOTE — Progress Notes (Signed)
Joel Kelley [id number] Code Status: not on file Admission Data: 03/19/2014 6:52 PM Attending Provider:  Verlon Au JQG:BEEFEOF,HQRFXJ H, MD Consults/ Treatment Team:  [facility]  Joel Kelley is a 80 y.o. male patient admitted from ED awake, alert - oriented  X 3 - no acute distress noted.  VSS - Blood pressure 121/79, pulse 110, temperature 99.4 F (37.4 C), temperature source Oral, resp. rate 18, height 5\' 6"  (1.676 m), weight 78.291 kg (172 lb 9.6 oz), SpO2 93.00%.    IV in place, occlusive dsg intact without redness.  Orientation to room, and floor completed with information packet given to patient/family.  Patient declined safety video at this time.  Admission INP armband ID verified with patient/family, and in place.   SR up x 2, fall assessment complete, with patient and family able to verbalize understanding of risk associated with falls, and verbalized understanding to call nsg before up out of bed.  Call light within reach, patient able to voice, and demonstrate understanding.  Skin, clean-dry- intact without evidence of bruising, or skin tears.   No evidence of skin break down noted on exam.     Will cont to eval and treat per MD orders.  Delman Cheadle, RN 03/19/2014 6:52 PM

## 2014-03-19 NOTE — ED Notes (Signed)
Patient reports he received the pneumonia vaccine on 03/10/14.

## 2014-03-19 NOTE — ED Provider Notes (Signed)
CSN: [id number]     Arrival date & time 03/19/14  1306 History   First MD Initiated Contact with Patient 03/19/14 1351     Chief Complaint  Patient presents with  . Weakness  . Cough     (Consider location/radiation/quality/duration/timing/severity/associated sxs/prior Treatment) Patient is a 80 y.o. male presenting with cough. The history is provided by the patient. No language interpreter was used.  Cough Cough characteristics:  Productive Sputum characteristics:  Yellow Severity:  Moderate Timing:  Constant Progression:  Worsening Chronicity:  New Smoker: no   Context: not sick contacts and not smoke exposure   Relieved by:  Nothing Worsened by:  Nothing tried Ineffective treatments:  None tried Associated symptoms: fever and myalgias   Risk factors: no recent infection     Past Medical History  Diagnosis Date  . HTN (hypertension)   . Hypothyroidism   . Erectile dysfunction   . Adenomatous colon polyp 2012    Bx 2012 showed tubular adenoma but no HG dysplasia  . Hearing loss of both ears     +hearing aids  . Increased prostate specific antigen (PSA) velocity 11/11/2011    Biopsy negative 03/11/12.  Followed by Dr. Alinda Money (mild rise in PSA 2015, but Dr. Alinda Money and pt agreed to continue with annual PSA/DRE, no invasive procedure).   Past Surgical History  Procedure Laterality Date  . Knee surgery      2009, laperoscopic  . Umbilical hernia repair  1980  . Tonsillectomy  1970   No family history on file. History  Substance Use Topics  . Smoking status: Former Smoker    Types: Cigarettes  . Smokeless tobacco: Never Used     Comment: quit 20+ years ago  . Alcohol Use: No     Comment: None in 18 years    Review of Systems  Constitutional: Positive for fever.  Respiratory: Positive for cough.   Musculoskeletal: Positive for myalgias.  All other systems reviewed and are negative.     Allergies  Review of patient's allergies indicates no known  allergies.  Home Medications   Prior to Admission medications   Medication Sig Start Date End Date Taking? Authorizing Provider  aspirin EC 81 MG tablet Take 81 mg by mouth daily.      Historical Provider, MD  atorvastatin (LIPITOR) 10 MG tablet Take 1 tablet (10 mg total) by mouth daily. 10/06/13 10/06/14  Tammi Sou, MD  calcium carbonate (CALCIUM 600) 600 MG TABS Take 600 mg by mouth daily.      Historical Provider, MD  levothyroxine (SYNTHROID, LEVOTHROID) 125 MCG tablet Take 1 tablet (125 mcg total) by mouth daily. 11/22/13   Tammi Sou, MD  lisinopril (PRINIVIL,ZESTRIL) 10 MG tablet TAKE 1 TABLET BY MOUTH DAILY 03/11/14   Tammi Sou, MD  loratadine (CLARITIN) 10 MG tablet Take 10 mg by mouth daily.      Historical Provider, MD  Multiple Vitamins-Minerals (CENTRUM SILVER PO) Take 1 tablet by mouth daily.      Historical Provider, MD  neomycin-polymyxin-hydrocortisone (CORTISPORIN) otic solution 4 gtts in left ear tid x 10d 06/15/13   Tammi Sou, MD  niacin 500 MG tablet Take 500 mg by mouth daily with breakfast.      Historical Provider, MD  Omega-3 Fatty Acids (FISH OIL) 1000 MG CAPS Take 1 capsule by mouth daily.      Historical Provider, MD  Psyllium (METAMUCIL PO) Take by mouth daily.      Historical Provider, MD  BP 106/52  Pulse 100  Temp(Src) 98.5 F (36.9 C)  Resp 20  Ht 5\' 6"  (1.676 m)  Wt 174 lb (78.926 kg)  BMI 28.10 kg/m2  SpO2 93% Physical Exam  Nursing note and vitals reviewed. Constitutional: He is oriented to person, place, and time. He appears well-developed and well-nourished.  HENT:  Head: Normocephalic and atraumatic.  Right Ear: External ear normal.  Left Ear: External ear normal.  Mouth/Throat: Oropharynx is clear and moist.  Eyes: Conjunctivae and EOM are normal. Pupils are equal, round, and reactive to light.  Neck: Normal range of motion.  Cardiovascular: Normal rate and normal heart sounds.   Pulmonary/Chest: Effort normal and  breath sounds normal.  Abdominal: Soft. He exhibits no distension.  Musculoskeletal: Normal range of motion.  Neurological: He is alert and oriented to person, place, and time.  Skin: Skin is warm.  Psychiatric: He has a normal mood and affect.    ED Course  Procedures (including critical care time) Labs Review Labs Reviewed - No data to display  Imaging Review No results found.   EKG Interpretation None      MDM port score 97 points  Risk Class 4  Dr. Helane Gunther in to see and examine pt.   I spoke to Dr. Alinda Dooms who will admit.  Pt given rocephin and zithromax Iv.     Final diagnoses:  Community acquired pneumonia        Fransico Meadow, [place] 03/19/14 1651

## 2014-03-19 NOTE — ED Provider Notes (Addendum)
Medical screening examination/treatment/procedure(s) were conducted as a shared visit with non-physician practitioner(s) and myself.  I personally evaluated the patient during the encounter.   EKG Interpretation None       Results for orders placed in visit on 03/03/14  CBC WITH DIFFERENTIAL      Result Value Ref Range   WBC 6.2  4.5 - 10.5 K/uL   RBC 4.58  4.22 - 5.81 Mil/uL   Hemoglobin 15.0  13.0 - 17.0 g/dL   HCT 44.5  39.0 - 52.0 %   MCV 97.0  78.0 - 100.0 fl   MCHC 33.8  30.0 - 36.0 g/dL   RDW 13.9  11.5 - 14.6 %   Platelets 198.0  150.0 - 400.0 K/uL   Neutrophils Relative % 56.2  43.0 - 77.0 %   Lymphocytes Relative 28.0  12.0 - 46.0 %   Monocytes Relative 10.1  3.0 - 12.0 %   Eosinophils Relative 5.4 (*) 0.0 - 5.0 %   Basophils Relative 0.3  0.0 - 3.0 %   Neutro Abs 3.5  1.4 - 7.7 K/uL   Lymphs Abs 1.7  0.7 - 4.0 K/uL   Monocytes Absolute 0.6  0.1 - 1.0 K/uL   Eosinophils Absolute 0.3  0.0 - 0.7 K/uL   Basophils Absolute 0.0  0.0 - 0.1 K/uL  COMPREHENSIVE METABOLIC PANEL      Result Value Ref Range   Sodium 142  135 - 145 mEq/L   Potassium 4.5  3.5 - 5.1 mEq/L   Chloride 110  96 - 112 mEq/L   CO2 28  19 - 32 mEq/L   Glucose, Bld 99  70 - 99 mg/dL   BUN 21  6 - 23 mg/dL   Creatinine, Ser 0.9  0.4 - 1.5 mg/dL   Total Bilirubin 0.9  0.3 - 1.2 mg/dL   Alkaline Phosphatase 59  39 - 117 U/L   AST 19  0 - 37 U/L   ALT 17  0 - 53 U/L   Total Protein 6.1  6.0 - 8.3 g/dL   Albumin 3.7  3.5 - 5.2 g/dL   Calcium 8.8  8.4 - 10.5 mg/dL   GFR 86.80  >60.00 mL/min  LIPID PANEL      Result Value Ref Range   Cholesterol 116  0 - 200 mg/dL   Triglycerides 61.0  0.0 - 149.0 mg/dL   HDL 33.10 (*) >39.00 mg/dL   VLDL 12.2  0.0 - 40.0 mg/dL   LDL Cholesterol 71  0 - 99 mg/dL   Total CHOL/HDL Ratio 4    TSH      Result Value Ref Range   TSH 0.42  0.35 - 5.50 uIU/mL   Dg Chest 2 View  03/19/2014   CLINICAL DATA:  Cough and fever  EXAM: CHEST  2 VIEW  COMPARISON:  July 19, 2013  FINDINGS: There is infiltrate in the posterior right base. Elsewhere lungs are clear. Heart size and pulmonary vascularity are normal. No adenopathy. No bone lesions.  IMPRESSION: Infiltrate posterior right base.   Electronically Signed   By: Lowella Grip M.D.   On: 03/19/2014 14:04    Labs from today are pending. Patient with a productive cough with some streaking of blood fever symptoms, started on Thursday. Associated with some chest discomfort but not severe. Some coughing occasional loose bowel movements. No nausea or vomiting. Patient without fever here. Symptoms remind patient of his previous pneumonias. Chest x-ray seems to be consistent with pneumonia.  This would be a community-acquired pneumonia patient hasn't had any recent admissions. Will treat here with IV antibiotics all labs are pending. If labs are very normal patient still clinically looks well after the IV and by his could be discharged home with a trial of antibiotics as an outpatient. If patient's labs are significant abnormal or patient appears to get worse will require admission.  Mervin Kung, MD 03/19/14 1454  Addendum. Patient's lungs were clear bilaterally no rales or wheezing. However patient's labs are now back significant  change in his BUN and creatinine. In addition patient's white blood cell count was 15,000. This will require admission. This will be a community-acquired pneumonia.   Mervin Kung, MD 03/19/14 [place], MD 03/20/14 [phone]

## 2014-03-20 DIAGNOSIS — Z0000 Encounter for general adult medical examination without abnormal findings: Secondary | ICD-10-CM

## 2014-03-20 DIAGNOSIS — R972 Elevated prostate specific antigen [PSA]: Secondary | ICD-10-CM

## 2014-03-20 DIAGNOSIS — E785 Hyperlipidemia, unspecified: Secondary | ICD-10-CM

## 2014-03-20 LAB — CBC WITH DIFFERENTIAL/PLATELET
Basophils Absolute: 0 10*3/uL (ref 0.0–0.1)
Basophils Relative: 0 % (ref 0–1)
EOS ABS: 0 10*3/uL (ref 0.0–0.7)
Eosinophils Relative: 0 % (ref 0–5)
HCT: 39.9 % (ref 39.0–52.0)
HEMOGLOBIN: 13.5 g/dL (ref 13.0–17.0)
LYMPHS ABS: 0.6 10*3/uL — AB (ref 0.7–4.0)
LYMPHS PCT: 4 % — AB (ref 12–46)
MCH: 32.2 pg (ref 26.0–34.0)
MCHC: 33.8 g/dL (ref 30.0–36.0)
MCV: 95.2 fL (ref 78.0–100.0)
MONOS PCT: 7 % (ref 3–12)
Monocytes Absolute: 1 10*3/uL (ref 0.1–1.0)
NEUTROS ABS: 12 10*3/uL — AB (ref 1.7–7.7)
NEUTROS PCT: 89 % — AB (ref 43–77)
PLATELETS: 167 10*3/uL (ref 150–400)
RBC: 4.19 MIL/uL — ABNORMAL LOW (ref 4.22–5.81)
RDW: 13.1 % (ref 11.5–15.5)
WBC: 13.6 10*3/uL — AB (ref 4.0–10.5)

## 2014-03-20 LAB — COMPREHENSIVE METABOLIC PANEL
ALT: 9 U/L (ref 0–53)
AST: 12 U/L (ref 0–37)
Albumin: 2.4 g/dL — ABNORMAL LOW (ref 3.5–5.2)
Alkaline Phosphatase: 108 U/L (ref 39–117)
BUN: 23 mg/dL (ref 6–23)
CALCIUM: 8.2 mg/dL — AB (ref 8.4–10.5)
CO2: 25 meq/L (ref 19–32)
CREATININE: 1.2 mg/dL (ref 0.50–1.35)
Chloride: 104 mEq/L (ref 96–112)
GFR, EST AFRICAN AMERICAN: 66 mL/min — AB (ref >90)
GFR, EST NON AFRICAN AMERICAN: 57 mL/min — AB (ref >90)
GLUCOSE: 100 mg/dL — AB (ref 70–99)
Potassium: 4 mEq/L (ref 3.7–5.3)
Sodium: 141 mEq/L (ref 137–147)
Total Bilirubin: 0.9 mg/dL (ref 0.3–1.2)
Total Protein: 5.6 g/dL — ABNORMAL LOW (ref 6.0–8.3)

## 2014-03-20 LAB — STREP PNEUMONIAE URINARY ANTIGEN: STREP PNEUMO URINARY ANTIGEN: NEGATIVE

## 2014-03-20 LAB — SODIUM, URINE, RANDOM

## 2014-03-20 LAB — CREATININE, URINE, RANDOM: CREATININE, URINE: 151.36 mg/dL

## 2014-03-20 LAB — EXPECTORATED SPUTUM ASSESSMENT W GRAM STAIN, RFLX TO RESP C

## 2014-03-20 LAB — EXPECTORATED SPUTUM ASSESSMENT W REFEX TO RESP CULTURE: SPECIAL REQUESTS: NORMAL

## 2014-03-20 LAB — CREATININE, SERUM
CREATININE: 1.25 mg/dL (ref 0.50–1.35)
GFR, EST AFRICAN AMERICAN: 62 mL/min — AB (ref >90)
GFR, EST NON AFRICAN AMERICAN: 54 mL/min — AB (ref >90)

## 2014-03-20 MED ORDER — WHITE PETROLATUM GEL
Status: AC
  Filled 2014-03-20: qty 5

## 2014-03-20 MED ORDER — WHITE PETROLATUM GEL: Status: DC | PRN

## 2014-03-20 MED ORDER — WHITE PETROLATUM GEL
Status: DC | PRN
  Administered 2014-03-20: 0.2 via TOPICAL

## 2014-03-20 MED ORDER — GUAIFENESIN ER 600 MG PO TB12
1200.0000 mg | ORAL_TABLET | Freq: Two times a day (BID) | ORAL | Status: DC
Start: 2014-03-20 — End: 2014-03-24
  Administered 2014-03-20 – 2014-03-24 (×9): 1200 mg via ORAL
  Filled 2014-03-20 (×10): qty 2

## 2014-03-20 MED ORDER — GUAIFENESIN-DM 100-10 MG/5ML PO SYRP
5.0000 mL | ORAL_SOLUTION | ORAL | Status: DC | PRN
  Administered 2014-03-22: 5 mL via ORAL
  Filled 2014-03-20 (×3): qty 5

## 2014-03-20 NOTE — Progress Notes (Signed)
[facility] PROGRESS NOTE   Joel Kelley JHE:[id number] DOB: 1934/01/10 DOA: 03/19/2014 PCP: Tammi Sou, MD  HPI/Subjective: Still have some shortness of breath and cough but better.  Assessment/Plan: Active Problems:   HYPERTENSION, BENIGN   Community acquired pneumonia   ARF (acute renal failure)   CAP (community acquired pneumonia)   . Community acquired pneumonia  Admitted for treatment of COPD, started on Rocephin and vancomycin. Obtain sputum cultures, blood cultures if febrile or if decompensates.  Supportive management with mucolytics, bronchodilators, antitussives and oxygen as needed.   Marland Kitchen HYPERTENSION, BENIGN Hold his lisinopril given his ARF   . ARF (acute renal failure) Likely secondary to dehydration, BUN/creatinine ratios exactly 2:1. Creatinine is back to normal,1.2 today    Code Status: Full code Family Communication: Plan discussed with the patient. Disposition Plan: Remains inpatient   Consultants:  None  Procedures:  None  Antibiotics:  Rocephin and vancomycin started on 03/19/14   Objective: Filed Vitals:   03/20/14 0617  BP: 107/65  Pulse: 94  Temp: 98.2 F (36.8 C)  Resp: 18   No intake or output data in the 24 hours ending 03/20/14 1037 Filed Weights   03/19/14 1318 03/19/14 1824  Weight: 78.926 kg (174 lb) 78.291 kg (172 lb 9.6 oz)    Exam: General: Alert and awake, oriented x3, not in any acute distress. HEENT: anicteric sclera, pupils reactive to light and accommodation, EOMI CVS: S1-S2 clear, no murmur rubs or gallops Chest: clear to auscultation bilaterally, no wheezing, rales or rhonchi Abdomen: soft nontender, nondistended, normal bowel sounds, no organomegaly Extremities: no cyanosis, clubbing or edema noted bilaterally Neuro: Cranial nerves II-XII intact, no focal neurological deficits  Data Reviewed: Basic Metabolic Panel:  Recent Labs Lab 03/19/14 1435 03/19/14 2330 03/20/14 0346  NA 137  --   141  K 3.6*  --  4.0  CL 100  --  104  CO2 24  --  25  GLUCOSE 131*  --  100*  BUN 30*  --  23  CREATININE 1.50* 1.25 1.20  CALCIUM 8.4  --  8.2*   Liver Function Tests:  Recent Labs Lab 03/19/14 1435 03/20/14 0346  AST 13 12  ALT 10 9  ALKPHOS 51 108  BILITOT 1.3* 0.9  PROT 6.0 5.6*  ALBUMIN 2.8* 2.4*   No results found for this basename: LIPASE, AMYLASE,  in the last 168 hours No results found for this basename: AMMONIA,  in the last 168 hours CBC:  Recent Labs Lab 03/19/14 1435 03/19/14 2330 03/20/14 0346  WBC 15.1* 13.9* 13.6*  NEUTROABS 13.4*  --  12.0*  HGB 14.8 13.6 13.5  HCT 42.5 39.9 39.9  MCV 95.9 94.8 95.2  PLT 151 145* 167   Cardiac Enzymes: No results found for this basename: CKTOTAL, CKMB, CKMBINDEX, TROPONINI,  in the last 168 hours BNP (last 3 results) No results found for this basename: PROBNP,  in the last 8760 hours CBG: No results found for this basename: GLUCAP,  in the last 168 hours  Micro Recent Results (from the past 240 hour(s))  CULTURE, EXPECTORATED SPUTUM-ASSESSMENT     Status: None   Collection Time    03/20/14 12:09 AM      Result Value Ref Range Status   Specimen Description SPUTUM   Final   Special Requests Normal   Final   Sputum evaluation     Final   Value: THIS SPECIMEN IS ACCEPTABLE. RESPIRATORY CULTURE REPORT TO FOLLOW.   Report Status 03/20/2014 FINAL  Final     Studies: Dg Chest 2 View  03/19/2014   CLINICAL DATA:  Cough and fever  EXAM: CHEST  2 VIEW  COMPARISON:  July 19, 2013  FINDINGS: There is infiltrate in the posterior right base. Elsewhere lungs are clear. Heart size and pulmonary vascularity are normal. No adenopathy. No bone lesions.  IMPRESSION: Infiltrate posterior right base.   Electronically Signed   By: Lowella Grip M.D.   On: 03/19/2014 14:04    Scheduled Meds: . aspirin EC  81 mg Oral Daily  . atorvastatin  10 mg Oral Daily  . azithromycin  500 mg Intravenous Q24H  . cefTRIAXone  (ROCEPHIN)  IV  1 g Intravenous Q24H  . enoxaparin (LOVENOX) injection  40 mg Subcutaneous Daily  . levothyroxine  125 mcg Oral QAC breakfast  . loratadine  10 mg Oral Daily  . niacin  500 mg Oral Q breakfast   Continuous Infusions:      Time spent: 35 minutes    Verlee Monte  [facility] Pager [phone] If 7PM-7AM, please contact night-coverage at [email or web address], password [facility] 03/20/2014, 10:37 AM  LOS: 1 day

## 2014-03-21 LAB — LEGIONELLA ANTIGEN, URINE: LEGIONELLA ANTIGEN, URINE: NEGATIVE

## 2014-03-21 LAB — GLUCOSE, CAPILLARY: Glucose-Capillary: 95 mg/dL (ref 70–99)

## 2014-03-21 MED ORDER — LEVOFLOXACIN 750 MG PO TABS: 750.0000 mg | ORAL_TABLET | Freq: Every day | ORAL | Status: DC

## 2014-03-21 MED ORDER — LISINOPRIL 10 MG PO TABS: 10.0000 mg | ORAL_TABLET | Freq: Every day | ORAL | Status: DC

## 2014-03-21 MED ORDER — GUAIFENESIN ER 600 MG PO TB12: 1200.0000 mg | ORAL_TABLET | Freq: Two times a day (BID) | ORAL | Status: DC

## 2014-03-21 MED ORDER — GUAIFENESIN-DM 100-10 MG/5ML PO SYRP: 5.0000 mL | ORAL_SOLUTION | ORAL | Status: DC | PRN

## 2014-03-21 NOTE — Discharge Summary (Deleted)
Physician Discharge Summary  Joel Kelley XBM:[id number] DOB: [birth year] DOA: 03/19/2014  PCP: Tammi Sou, MD  Admit date: 03/19/2014 Discharge date: 03/24/2014  Time spent: 50 minutes  Recommendations for Outpatient Follow-up:  1. BMET in 1 week.  Patient with acute renal failure and community acquired pneumonia 2. Follow up cxr in 4-6 weeks to ensure resolution of pneumonia. 3. Not on Lisinopril in the hospital.  BPs were normal.  Please evaluate whether or not it is needed outpatient.  Discharge Diagnoses:  Principal Problem:   CAP (community acquired pneumonia) Active Problems:   HYPERTENSION, BENIGN   Community acquired pneumonia   ARF (acute renal failure)   Discharge Condition: stable.  Diet recommendation: regular  Filed Weights   03/19/14 1318 03/19/14 1824  Weight: 78.926 kg (174 lb) 78.291 kg (172 lb 9.6 oz)    History of present illness:  80 yo male with history of HTN, Hypothyroid, and hearing loss presented with a 4 day hx of fevers and cough productive of yellow and sometimes rusty red phlegm. Chest pain with coughing only. He had one day of diarrhea that resolved. He originally  presented to [facility] and was diagnosed with CAP.  Because he was slightly hypoxic he was transfered to [facility] for further treatment and IV antibiotics. His Cr went up to 1.5 from baseline of 0.9  Hospital Course:  . Community acquired pneumonia  Admitted for treatment of COPD, started on Rocephin and azithromycin Sputum cultures were obtained.  Abundant WBC seen, culture showed no organisms He received supportive management with mucolytics, bronchodilators, antitussives and oxygen as needed.  Patient is much improved this am after receiving a dose of IV lasix last night, and reports he is ready for d/c.   Will D/C on 5 more days of oral Levaquin.  Marland Kitchen HYPERTENSION, BENIGN  Held his lisinopril given his ARF.   BP is in normal range currently.   Will restart Lisinopril towards the end of  his antibiotic therapy given his recent ARF. Please evaluate in follow up whether or not he still needs lisinopril.  . ARF (acute renal failure)  Likely secondary to dehydration, BUN/creatinine ratios exactly 2:1.  Creatinine is back to normal,1.2 today  Will request a bmet one week after discharge.  Discharge Exam: Filed Vitals:   03/24/14 0634  BP: 116/66  Pulse: 86  Temp: 97.5 F (36.4 C)  Resp: 18   General: Alert and awake, oriented x3, not in any acute distress. pleasant HEENT: anicteric sclera, pupils reactive to light and accommodation, EOMI  CVS: S1-S2 clear, no murmur rubs or gallops  Chest: clear to auscultation bilaterally, no wheezing, rales or rhonchi.  No increased work of breathing. Abdomen: soft nontender, nondistended, normal bowel sounds, no organomegaly  Extremities: no cyanosis, clubbing or edema noted bilaterally  Neuro: Cranial nerves II-XII intact, no focal neurological deficits Skin:  Slightly elevated erythema around lips and chin.   Discharge Instructions       Discharge Instructions   Diet - low sodium heart healthy    Complete by:  As directed      Diet - low sodium heart healthy    Complete by:  As directed      Diet - low sodium heart healthy    Complete by:  As directed      Increase activity slowly    Complete by:  As directed      Increase activity slowly    Complete by:  As directed      Increase  activity slowly    Complete by:  As directed             Medication List         aspirin EC 81 MG tablet  Take 81 mg by mouth daily.     atorvastatin 10 MG tablet  Commonly known as:  LIPITOR  Take 1 tablet (10 mg total) by mouth daily.     benzonatate 200 MG capsule  Commonly known as:  TESSALON  Take 1 capsule (200 mg total) by mouth 3 (three) times daily as needed for cough.     CALCIUM 600 600 MG Tabs tablet  Generic drug:  calcium carbonate  Take 600 mg by mouth daily.     CENTRUM SILVER PO  Take 1 tablet by mouth  daily.     chlorpheniramine-HYDROcodone 10-8 MG/5ML Lqcr  Commonly known as:  TUSSIONEX PENNKINETIC ER  Take 5 mLs by mouth at bedtime as needed for cough.     Fish Oil 1000 MG Caps  Take 1 capsule by mouth daily.     guaiFENesin 600 MG 12 hr tablet  Commonly known as:  MUCINEX  Take 2 tablets (1,200 mg total) by mouth 2 (two) times daily.     guaiFENesin-dextromethorphan 100-10 MG/5ML syrup  Commonly known as:  ROBITUSSIN DM  Take 5 mLs by mouth every 4 (four) hours as needed for cough.     ibuprofen 800 MG tablet  Commonly known as:  ADVIL,MOTRIN  Take 800 mg by mouth every 8 (eight) hours as needed for mild pain.     levofloxacin 750 MG tablet  Commonly known as:  LEVAQUIN  Take 1 tablet (750 mg total) by mouth daily.     levothyroxine 125 MCG tablet  Commonly known as:  SYNTHROID, LEVOTHROID  Take 1 tablet (125 mcg total) by mouth daily.     lisinopril 10 MG tablet  Commonly known as:  PRINIVIL,ZESTRIL  Take 1 tablet (10 mg total) by mouth daily.  Start taking on:  03/29/2014     loratadine 10 MG tablet  Commonly known as:  CLARITIN  Take 10 mg by mouth daily.     METAMUCIL PO  Take 1 scoop by mouth daily.     niacin 500 MG tablet  Take 500 mg by mouth daily with breakfast.     oxycodone 5 MG capsule  Commonly known as:  OXY-IR  Take 1 capsule (5 mg total) by mouth every 4 (four) hours as needed.       No Known Allergies Follow-up Information   Follow up with MCGOWEN,PHILIP H, MD In 1 week.   Specialty:  Family Medicine   Contact information:   [address] [place] [address] [phone]        The results of significant diagnostics from this hospitalization (including imaging, microbiology, ancillary and laboratory) are listed below for reference.    Significant Diagnostic Studies: Dg Chest 2 View  03/19/2014   CLINICAL DATA:  Cough and fever  EXAM: CHEST  2 VIEW  COMPARISON:  July 19, 2013  FINDINGS: There is infiltrate in the  posterior right base. Elsewhere lungs are clear. Heart size and pulmonary vascularity are normal. No adenopathy. No bone lesions.  IMPRESSION: Infiltrate posterior right base.   Electronically Signed   By: Lowella Grip M.D.   On: 03/19/2014 14:04    Microbiology: Recent Results (from the past 240 hour(s))  CULTURE, EXPECTORATED SPUTUM-ASSESSMENT     Status: None  Collection Time    03/20/14 12:09 AM      Result Value Ref Range Status   Specimen Description SPUTUM   Final   Special Requests Normal   Final   Sputum evaluation     Final   Value: THIS SPECIMEN IS ACCEPTABLE. RESPIRATORY CULTURE REPORT TO FOLLOW.   Report Status 03/20/2014 FINAL   Final  CULTURE, RESPIRATORY (NON-EXPECTORATED)     Status: None   Collection Time    03/20/14 12:09 AM      Result Value Ref Range Status   Specimen Description SPUTUM   Final   Special Requests ADDED 0056   Final   Gram Stain     Final   Value: ABUNDANT WBC PRESENT,BOTH PMN AND MONONUCLEAR     NO SQUAMOUS EPITHELIAL CELLS SEEN     NO ORGANISMS SEEN     Performed at [facility]   Culture     Final   Value: NORMAL OROPHARYNGEAL FLORA     Performed at [facility]   Report Status 03/22/2014 FINAL   Final     Labs: Basic Metabolic Panel:  Recent Labs Lab 03/19/14 1435 03/19/14 2330 03/20/14 0346 03/23/14 0733  NA 137  --  141 137  K 3.6*  --  4.0 3.7  CL 100  --  104 99  CO2 24  --  25 26  GLUCOSE 131*  --  100* 97  BUN 30*  --  23 15  CREATININE 1.50* 1.25 1.20 0.97  CALCIUM 8.4  --  8.2* 8.8   Liver Function Tests:  Recent Labs Lab 03/19/14 1435 03/20/14 0346  AST 13 12  ALT 10 9  ALKPHOS 51 108  BILITOT 1.3* 0.9  PROT 6.0 5.6*  ALBUMIN 2.8* 2.4*   CBC:  Recent Labs Lab 03/19/14 1435 03/19/14 2330 03/20/14 0346 03/23/14 0733  WBC 15.1* 13.9* 13.6* 8.7  NEUTROABS 13.4*  --  12.0*  --   HGB 14.8 13.6 13.5 14.2  HCT 42.5 39.9 39.9 41.6  MCV 95.9 94.8 95.2 95.6  PLT 151 145* 167 224      Signed:  Karen Kitchens [phone]  [facility] 03/24/2014, 10:17 AM

## 2014-03-21 NOTE — Progress Notes (Signed)
Addendum  Patient seen and examined, chart and data base reviewed.  I agree with the above assessment and plan.  For full details please see Mrs. Imogene Burn PA note.  Community acquired pneumonia, still short of breath with ambulation, continue current medication regimen.   Birdie Hopes, MD [facility] Pager: [phone] 03/21/2014, 11:15 AM

## 2014-03-21 NOTE — Progress Notes (Signed)
[facility] PROGRESS NOTE   Joel Kelley ION:[id number] DOB: Feb 17, 1934 DOA: 03/19/2014 PCP: Tammi Sou, MD  HPI/Subjective: Still SOB when ambulating.  In good spirits.  Assessment/Plan: Active Problems:   HYPERTENSION, BENIGN   Community acquired pneumonia   ARF (acute renal failure)   CAP (community acquired pneumonia)   . Community acquired pneumonia  Admitted for treatment of COPD, started on Rocephin and vancomycin. Sputum culture shows abundant wbc, but is not yet finalized. Supportive management with mucolytics, bronchodilators, antitussives and oxygen as needed.   Marland Kitchen HYPERTENSION, BENIGN Continuing to hold lisinopril.  BP is within normal range.  . ARF (acute renal failure) Likely secondary to dehydration, BUN/creatinine ratios exactly 2:1. Creatinine is back to normal   Code Status: Full code Family Communication: Plan discussed with the patient. Disposition Plan: Remains inpatient   Consultants:  None  Procedures:  None  Antibiotics:  Rocephin and azithromycin started on 03/19/14   Objective: Filed Vitals:   03/21/14 0518  BP: 125/72  Pulse: 78  Temp: 98.2 F (36.8 C)  Resp: 18   No intake or output data in the 24 hours ending 03/21/14 0940 Filed Weights   03/19/14 1318 03/19/14 1824  Weight: 78.926 kg (174 lb) 78.291 kg (172 lb 9.6 oz)    Exam: General: Alert and awake, oriented x3, not in any acute distress. HEENT: anicteric sclera, pupils reactive to light and accommodation, EOMI CVS: S1-S2 clear, no murmur rubs or gallops Chest: clear to auscultation bilaterally, no wheezing, rales or rhonchi Abdomen: soft nontender, nondistended, normal bowel sounds, no organomegaly Extremities: no cyanosis, clubbing or edema noted bilaterally Neuro: Cranial nerves II-XII intact, no focal neurological deficits Skin:  Slight raised rash around his chin and lips.  Data Reviewed: Basic Metabolic Panel:  Recent Labs Lab  03/19/14 1435 03/19/14 2330 03/20/14 0346  NA 137  --  141  K 3.6*  --  4.0  CL 100  --  104  CO2 24  --  25  GLUCOSE 131*  --  100*  BUN 30*  --  23  CREATININE 1.50* 1.25 1.20  CALCIUM 8.4  --  8.2*   Liver Function Tests:  Recent Labs Lab 03/19/14 1435 03/20/14 0346  AST 13 12  ALT 10 9  ALKPHOS 51 108  BILITOT 1.3* 0.9  PROT 6.0 5.6*  ALBUMIN 2.8* 2.4*   No results found for this basename: LIPASE, AMYLASE,  in the last 168 hours No results found for this basename: AMMONIA,  in the last 168 hours CBC:  Recent Labs Lab 03/19/14 1435 03/19/14 2330 03/20/14 0346  WBC 15.1* 13.9* 13.6*  NEUTROABS 13.4*  --  12.0*  HGB 14.8 13.6 13.5  HCT 42.5 39.9 39.9  MCV 95.9 94.8 95.2  PLT 151 145* 167    Micro Recent Results (from the past 240 hour(s))  CULTURE, EXPECTORATED SPUTUM-ASSESSMENT     Status: None   Collection Time    03/20/14 12:09 AM      Result Value Ref Range Status   Specimen Description SPUTUM   Final   Special Requests Normal   Final   Sputum evaluation     Final   Value: THIS SPECIMEN IS ACCEPTABLE. RESPIRATORY CULTURE REPORT TO FOLLOW.   Report Status 03/20/2014 FINAL   Final  CULTURE, RESPIRATORY (NON-EXPECTORATED)     Status: None   Collection Time    03/20/14 12:09 AM      Result Value Ref Range Status   Specimen Description SPUTUM   Final  Special Requests ADDED [phone]   Final   Gram Stain     Final   Value: ABUNDANT WBC PRESENT,BOTH PMN AND MONONUCLEAR     NO SQUAMOUS EPITHELIAL CELLS SEEN     NO ORGANISMS SEEN     Performed at [facility]   Culture PENDING   Incomplete   Report Status PENDING   Incomplete     Studies: Dg Chest 2 View  03/19/2014   CLINICAL DATA:  Cough and fever  EXAM: CHEST  2 VIEW  COMPARISON:  July 19, 2013  FINDINGS: There is infiltrate in the posterior right base. Elsewhere lungs are clear. Heart size and pulmonary vascularity are normal. No adenopathy. No bone lesions.  IMPRESSION: Infiltrate  posterior right base.   Electronically Signed   By: Lowella Grip M.D.   On: 03/19/2014 14:04    Scheduled Meds: . aspirin EC  81 mg Oral Daily  . atorvastatin  10 mg Oral Daily  . azithromycin  500 mg Intravenous Q24H  . cefTRIAXone (ROCEPHIN)  IV  1 g Intravenous Q24H  . enoxaparin (LOVENOX) injection  40 mg Subcutaneous Daily  . guaiFENesin  1,200 mg Oral BID  . levothyroxine  125 mcg Oral QAC breakfast  . loratadine  10 mg Oral Daily  . niacin  500 mg Oral Q breakfast   Continuous Infusions:     Karen Kitchens  [facility] Pager [phone] If 7PM-7AM, please contact night-coverage at [email or web address], password [email or web address] 03/21/2014, 9:40 AM  LOS: 2 days

## 2014-03-22 LAB — CULTURE, RESPIRATORY

## 2014-03-22 LAB — CULTURE, RESPIRATORY W GRAM STAIN: Culture: NORMAL

## 2014-03-22 MED ORDER — OXYCODONE-ACETAMINOPHEN 5-325 MG PO TABS
1.0000 | ORAL_TABLET | Freq: Four times a day (QID) | ORAL | Status: DC | PRN
  Administered 2014-03-22: 1 via ORAL
  Filled 2014-03-22: qty 1

## 2014-03-22 MED ORDER — LEVOFLOXACIN 750 MG PO TABS: 750.0000 mg | ORAL_TABLET | Freq: Every day | ORAL | Status: DC

## 2014-03-22 MED ORDER — LISINOPRIL 10 MG PO TABS: 10.0000 mg | ORAL_TABLET | Freq: Every day | ORAL | Status: DC

## 2014-03-22 MED ORDER — OXYCODONE-ACETAMINOPHEN 5-325 MG PO TABS
2.0000 | ORAL_TABLET | Freq: Once | ORAL | Status: AC
  Administered 2014-03-22: 2 via ORAL
  Filled 2014-03-22: qty 2

## 2014-03-22 MED ORDER — HYDROCOD POLST-CHLORPHEN POLST 10-8 MG/5ML PO LQCR
5.0000 mL | Freq: Every evening | ORAL | Status: DC | PRN
  Administered 2014-03-23: 5 mL via ORAL
  Filled 2014-03-22: qty 5

## 2014-03-22 MED ORDER — OXYCODONE HCL 5 MG PO CAPS: 5.0000 mg | ORAL_CAPSULE | ORAL | Status: DC | PRN

## 2014-03-22 MED ORDER — HYDROCOD POLST-CHLORPHEN POLST 10-8 MG/5ML PO LQCR
5.0000 mL | Freq: Every evening | ORAL | Status: DC | PRN
Start: 2014-03-22 — End: 2014-04-18

## 2014-03-22 MED ORDER — OXYCODONE-ACETAMINOPHEN 5-325 MG PO TABS
1.0000 | ORAL_TABLET | Freq: Four times a day (QID) | ORAL | Status: DC | PRN
  Administered 2014-03-22 – 2014-03-24 (×5): 2 via ORAL
  Filled 2014-03-22 (×5): qty 2

## 2014-03-22 NOTE — Progress Notes (Addendum)
After this morning's rounds we planned for discharge for Joel Kelley.  Later in the day, RN reports that patient is desatting with ambulation (86). On exam he has mildly increased work of breathing at rest.   He is also having increased right sided flank / back pain not relieved by oxy ir 5 mg.  Believe this back pain is related to PNA which is in the right posterior.  Will continue to monitor overnight.  If his oxygen sat decreases further or his pain significantly worsens would consider CTA.  Imogene Burn, PA-C [facility] Pager: [phone]

## 2014-03-22 NOTE — Progress Notes (Signed)
Pt ambulated in hallway about 361ft without oxygen with oxygen saturations at 88%.  Pt slightly increased to 90%, then decreased to 86% towards the end of the walk.  Placed pt on 2.5L [place] to bring oxygen saturation to 90% at rest.  Imogene Burn, PA notified.  Will keep one more night and continue to monitor.

## 2014-03-22 NOTE — Discharge Summary (Signed)
Addendum  Patient seen and examined, chart and data base reviewed.  I agree with the above assessment and plan.  For full details please see Mrs. Imogene Burn PA note.  Community acquired pneumonia, improving.  Patient still have some shortness of breath and right sided chest wall pain.  Still hypoxic with ambulation, continue current pulmonary regimen, likely to be D/C in AM.   Birdie Hopes, MD [facility] Pager: [phone] 03/22/2014, 3:35 PM

## 2014-03-23 ENCOUNTER — Inpatient Hospital Stay (HOSPITAL_COMMUNITY): Payer: Medicare (Managed Care)

## 2014-03-23 DIAGNOSIS — M7989 Other specified soft tissue disorders: Secondary | ICD-10-CM

## 2014-03-23 DIAGNOSIS — E039 Hypothyroidism, unspecified: Secondary | ICD-10-CM

## 2014-03-23 LAB — CBC
HEMATOCRIT: 41.6 % (ref 39.0–52.0)
HEMOGLOBIN: 14.2 g/dL (ref 13.0–17.0)
MCH: 32.6 pg (ref 26.0–34.0)
MCHC: 34.1 g/dL (ref 30.0–36.0)
MCV: 95.6 fL (ref 78.0–100.0)
Platelets: 224 10*3/uL (ref 150–400)
RBC: 4.35 MIL/uL (ref 4.22–5.81)
RDW: 13.2 % (ref 11.5–15.5)
WBC: 8.7 10*3/uL (ref 4.0–10.5)

## 2014-03-23 LAB — BASIC METABOLIC PANEL
BUN: 15 mg/dL (ref 6–23)
CHLORIDE: 99 meq/L (ref 96–112)
CO2: 26 meq/L (ref 19–32)
CREATININE: 0.97 mg/dL (ref 0.50–1.35)
Calcium: 8.8 mg/dL (ref 8.4–10.5)
GFR calc non Af Amer: 78 mL/min — ABNORMAL LOW (ref >90)
GFR, EST AFRICAN AMERICAN: 90 mL/min — AB (ref >90)
Glucose, Bld: 97 mg/dL (ref 70–99)
POTASSIUM: 3.7 meq/L (ref 3.7–5.3)
Sodium: 137 mEq/L (ref 137–147)

## 2014-03-23 MED ORDER — BENZONATATE 100 MG PO CAPS
200.0000 mg | ORAL_CAPSULE | Freq: Three times a day (TID) | ORAL | Status: DC | PRN
  Filled 2014-03-23: qty 2

## 2014-03-23 MED ORDER — FUROSEMIDE 10 MG/ML IJ SOLN
20.0000 mg | Freq: Once | INTRAMUSCULAR | Status: AC
  Administered 2014-03-23: 20 mg via INTRAVENOUS
  Filled 2014-03-23: qty 2

## 2014-03-23 MED ORDER — AZITHROMYCIN 500 MG PO TABS
500.0000 mg | ORAL_TABLET | Freq: Every day | ORAL | Status: DC
  Administered 2014-03-24: 500 mg via ORAL
  Filled 2014-03-23: qty 1

## 2014-03-23 NOTE — Progress Notes (Signed)
VASCULAR LAB PRELIMINARY  PRELIMINARY  PRELIMINARY  PRELIMINARY  Bilateral lower extremity venous duplex  completed.    Preliminary report:  Bilateral:  No evidence of DVT, superficial thrombosis, or Baker's Cyst.    Nani Ravens, RVT 03/23/2014, 3:13 PM

## 2014-03-23 NOTE — Progress Notes (Addendum)
PATIENT DETAILS Name: Joel Kelley Age: 80 y.o. Sex: male Date of Birth: 1933/11/12 Admit Date: 03/19/2014 Admitting Physician Toy Baker, MD YWV:[id number],RSWNIO H, MD  Subjective: No further SOB today-ambulated in the hallway, significantly better  Assessment/Plan: Acute Hypoxic Resp Failure -secondary to CAP -now no longer requiring O2 -encourage incentinve spirotmety -lower ext Doppler neg  PNA -significantly improved today -afebrile, leukocytosis has resolved, but is hypoxic on ambulation -c/w Rocephin/Zithromax-started 5/16-will transition to Levaquin -sputum culture negative, Urine strep Pneumo/Legionella negative  ARF -resolved-likely pre-renal  HTN -controlled without the use of antihypertensives, Lisinopril remains on hold  Hypothyroidism -stable, c/w Levothyroxine  Disposition: Remain inpatient-home today  DVT Prophylaxis: Prophylactic Lovenox   Code Status: Full code  Family Communication None  Procedures:  None  CONSULTS:  None  Time spent 40 minutes-which includes 50% of the time with face-to-face with patient/ family and coordinating care related to the above assessment and plan.    MEDICATIONS: Scheduled Meds: . aspirin EC  81 mg Oral Daily  . atorvastatin  10 mg Oral Daily  . [START ON 03/24/2014] azithromycin  500 mg Oral Daily  . cefTRIAXone (ROCEPHIN)  IV  1 g Intravenous Q24H  . enoxaparin (LOVENOX) injection  40 mg Subcutaneous Daily  . guaiFENesin  1,200 mg Oral BID  . levothyroxine  125 mcg Oral QAC breakfast  . loratadine  10 mg Oral Daily  . niacin  500 mg Oral Q breakfast   Continuous Infusions:  PRN Meds:.chlorpheniramine-HYDROcodone, guaiFENesin-dextromethorphan, oxyCODONE-acetaminophen, white petrolatum  Antibiotics: Anti-infectives   Start     Dose/Rate Route Frequency Ordered Stop   03/24/14 1000  azithromycin (ZITHROMAX) tablet 500 mg     500 mg Oral Daily 03/23/14 1449     03/22/14 0000   levofloxacin (LEVAQUIN) 750 MG tablet     750 mg Oral Daily 03/22/14 1053     03/21/14 0000  levofloxacin (LEVAQUIN) 750 MG tablet  Status:  Discontinued     750 mg Oral Daily 03/21/14 0825 03/22/14    03/20/14 1000  azithromycin (ZITHROMAX) 500 mg in dextrose 5 % 250 mL IVPB  Status:  Discontinued     500 mg 250 mL/hr over 60 Minutes Intravenous Every 24 hours 03/19/14 2252 03/23/14 1449   03/20/14 1000  cefTRIAXone (ROCEPHIN) 1 g in dextrose 5 % 50 mL IVPB     1 g 100 mL/hr over 30 Minutes Intravenous Every 24 hours 03/19/14 2252     03/19/14 1509  cefTRIAXone (ROCEPHIN) 1 G injection    Comments:  Riggan, Sylvia   : cabinet override      03/19/14 1509 03/19/14 1640   03/19/14 1500  cefTRIAXone (ROCEPHIN) 1 g in dextrose 5 % 50 mL IVPB  Status:  Discontinued     1 g 100 mL/hr over 30 Minutes Intravenous Every 24 hours 03/19/14 1449 03/19/14 2252   03/19/14 1500  azithromycin (ZITHROMAX) 500 mg in dextrose 5 % 250 mL IVPB  Status:  Discontinued     500 mg 250 mL/hr over 60 Minutes Intravenous Every 24 hours 03/19/14 1449 03/19/14 2252       PHYSICAL EXAM: Vital signs in last 24 hours: Filed Vitals:   03/23/14 0645 03/23/14 1134 03/23/14 1135 03/23/14 1449  BP: 134/78   118/74  Pulse: 96   91  Temp: 98.2 F (36.8 C)   97.7 F (36.5 C)  TempSrc: Oral   Oral  Resp: 20   20  Height:  Weight:      SpO2: 91% 90% 94%     Weight change:  Filed Weights   03/19/14 1318 03/19/14 1824  Weight: 78.926 kg (174 lb) 78.291 kg (172 lb 9.6 oz)   Body mass index is 27.87 kg/(m^2).   Gen Exam: Awake and alert with clear speech.  Neck: Supple, No JVD.   Chest: B/L Clear.   CVS: S1 S2 Regular, no murmurs.  Abdomen: soft, BS +, non tender, non distended.  Extremities:trace edema, lower extremities warm to touch. Neurologic: Non Focal.   Skin: No Rash.  Wounds: N/A.    Intake/Output from previous day: No intake or output data in the 24 hours ending 03/23/14 1549   LAB  RESULTS: CBC  Recent Labs Lab 03/19/14 1435 03/19/14 2330 03/20/14 0346 03/23/14 0733  WBC 15.1* 13.9* 13.6* 8.7  HGB 14.8 13.6 13.5 14.2  HCT 42.5 39.9 39.9 41.6  PLT 151 145* 167 224  MCV 95.9 94.8 95.2 95.6  MCH 33.4 32.3 32.2 32.6  MCHC 34.8 34.1 33.8 34.1  RDW 12.9 13.0 13.1 13.2  LYMPHSABS 1.1  --  0.6*  --   MONOABS 0.6  --  1.0  --   EOSABS 0.0  --  0.0  --   BASOSABS 0.0  --  0.0  --     Chemistries   Recent Labs Lab 03/19/14 1435 03/19/14 2330 03/20/14 0346 03/23/14 0733  NA 137  --  141 137  K 3.6*  --  4.0 3.7  CL 100  --  104 99  CO2 24  --  25 26  GLUCOSE 131*  --  100* 97  BUN 30*  --  23 15  CREATININE 1.50* 1.25 1.20 0.97  CALCIUM 8.4  --  8.2* 8.8    CBG:  Recent Labs Lab 03/21/14 1703  GLUCAP 95    GFR Estimated Creatinine Clearance: 62.8 ml/min (by C-G formula based on Cr of 0.97).  Coagulation profile No results found for this basename: INR, PROTIME,  in the last 168 hours  Cardiac Enzymes No results found for this basename: CK, CKMB, TROPONINI, MYOGLOBIN,  in the last 168 hours  No components found with this basename: POCBNP,  No results found for this basename: DDIMER,  in the last 72 hours No results found for this basename: HGBA1C,  in the last 72 hours No results found for this basename: CHOL, HDL, LDLCALC, TRIG, CHOLHDL, LDLDIRECT,  in the last 72 hours No results found for this basename: TSH, T4TOTAL, FREET3, T3FREE, THYROIDAB,  in the last 72 hours No results found for this basename: VITAMINB12, FOLATE, FERRITIN, TIBC, IRON, RETICCTPCT,  in the last 72 hours No results found for this basename: LIPASE, AMYLASE,  in the last 72 hours  Urine Studies No results found for this basename: UACOL, UAPR, USPG, UPH, UTP, UGL, UKET, UBIL, UHGB, UNIT, UROB, ULEU, UEPI, UWBC, URBC, UBAC, CAST, CRYS, UCOM, BILUA,  in the last 72 hours  MICROBIOLOGY: Recent Results (from the past 240 hour(s))  CULTURE, EXPECTORATED SPUTUM-ASSESSMENT      Status: None   Collection Time    03/20/14 12:09 AM      Result Value Ref Range Status   Specimen Description SPUTUM   Final   Special Requests Normal   Final   Sputum evaluation     Final   Value: THIS SPECIMEN IS ACCEPTABLE. RESPIRATORY CULTURE REPORT TO FOLLOW.   Report Status 03/20/2014 FINAL   Final  CULTURE, RESPIRATORY (NON-EXPECTORATED)  Status: None   Collection Time    03/20/14 12:09 AM      Result Value Ref Range Status   Specimen Description SPUTUM   Final   Special Requests ADDED 0056   Final   Gram Stain     Final   Value: ABUNDANT WBC PRESENT,BOTH PMN AND MONONUCLEAR     NO SQUAMOUS EPITHELIAL CELLS SEEN     NO ORGANISMS SEEN     Performed at [facility]   Culture     Final   Value: NORMAL OROPHARYNGEAL FLORA     Performed at [facility]   Report Status 03/22/2014 FINAL   Final    RADIOLOGY STUDIES/RESULTS: Dg Chest 2 View  03/23/2014   CLINICAL DATA:  Cough and shortness of breath and pneumonia  EXAM: CHEST  2 VIEW  COMPARISON:  DG CHEST 2 VIEW dated 03/19/2014  FINDINGS: There is hypoinflation on the right with bibasilar atelectasis and new small right pleural effusion. The left lung is better inflated but exhibits coarse lung markings inferiorly and a trace of costophrenic angle blunting. The cardiac silhouette is top-normal in size. The pulmonary vascularity is not engorged. No acute bony abnormality is demonstrated.  IMPRESSION: There is worsening atelectasis associated with the pneumonia in the right lower lobe. There is a new small right pleural effusion.   Electronically Signed   By: David  Martinique   On: 03/23/2014 14:59   Dg Chest 2 View  03/19/2014   CLINICAL DATA:  Cough and fever  EXAM: CHEST  2 VIEW  COMPARISON:  July 19, 2013  FINDINGS: There is infiltrate in the posterior right base. Elsewhere lungs are clear. Heart size and pulmonary vascularity are normal. No adenopathy. No bone lesions.  IMPRESSION: Infiltrate posterior right  base.   Electronically Signed   By: Lowella Grip M.D.   On: 03/19/2014 14:04    Kade Demicco Kristeen Mans, MD  [facility] Pager:[phone]  If 7PM-7AM, please contact night-coverage [email or web address] Password [email or web address] 03/23/2014, 3:49 PM   LOS: 4 days   **Disclaimer: This note may have been dictated with voice recognition software. Similar sounding words can inadvertently be transcribed and this note may contain transcription errors which may not have been corrected upon publication of note.**

## 2014-03-23 NOTE — Progress Notes (Signed)
SATURATION QUALIFICATIONS: (This note is used to comply with regulatory documentation for home oxygen)  Patient Saturations on Room Air at Rest = 90%  Patient Saturations on Room Air while Ambulating =88%  Patient Saturations on 2.5L Liters of oxygen while Ambulating = 92  Please briefly explain why patient needs home oxygen:

## 2014-03-24 DIAGNOSIS — J9600 Acute respiratory failure, unspecified whether with hypoxia or hypercapnia: Secondary | ICD-10-CM

## 2014-03-24 MED ORDER — LISINOPRIL 10 MG PO TABS: 10.0000 mg | ORAL_TABLET | Freq: Every day | ORAL | Status: DC

## 2014-03-24 MED ORDER — AZITHROMYCIN 500 MG PO TABS: 500.0000 mg | ORAL_TABLET | Freq: Every day | ORAL | Status: DC

## 2014-03-24 MED ORDER — LEVOFLOXACIN 750 MG PO TABS: 750.0000 mg | ORAL_TABLET | Freq: Every day | ORAL | Status: DC

## 2014-03-24 MED ORDER — BENZONATATE 200 MG PO CAPS: 200.0000 mg | ORAL_CAPSULE | Freq: Three times a day (TID) | ORAL | Status: DC | PRN

## 2014-03-24 NOTE — Discharge Instructions (Signed)
Follow up with your primary care physician in 1-2 weeks for blood work and in 4-6 weeks for a repeat chest xray.    You have not required Lisinopril in the hospital and your blood pressure has remained normal.  Please ask Dr. Melford Aase whether or not you need to continue to take this medication.

## 2014-03-24 NOTE — Progress Notes (Signed)
03/24/14 Patient going home today. IV site removed, discharge instructions reviewed.Patien does not need any oxygen at home sat's over 90.

## 2014-03-24 NOTE — Care Management Note (Signed)
    Page 1 of 1   03/24/2014     12:27:24 PM CARE MANAGEMENT NOTE 03/24/2014  Patient:  Joel Kelley,Joel Kelley   Account Number:  [id number]  Date Initiated:  03/24/2014  Documentation initiated by:  Tomi Bamberger  Subjective/Objective Assessment:   dx pna, sob  admit- lives with spouse.     Action/Plan:   Anticipated DC Date:  03/24/2014   Anticipated DC Plan:  [place]  CM consult      Choice offered to / List presented to:             Status of service:  Completed, signed off Medicare Important Message given?   (If response is "NO", the following Medicare IM given date fields will be blank) Date Medicare IM given:   Date Additional Medicare IM given:    Discharge Disposition:  HOME/SELF CARE  Per UR Regulation:  Reviewed for med. necessity/level of care/duration of stay  If discussed at [place] of Stay Meetings, dates discussed:    Comments:  03/24/14 [place], BSN [phone] patient is for dc to home today, per Bailey Mech RN , patient does not need home oxygen.

## 2014-03-24 NOTE — Discharge Summary (Signed)
PATIENT DETAILS Name: Joel Kelley Age: 80 y.o. Sex: male Date of Birth: June 05, 1934 MRN: [id number]. Admit Date: 03/19/2014 Admitting Physician: Toy Baker, MD OZD:GUYQIHK,VQQVZD H, MD  Recommendations for Outpatient Follow-up:  1. Please repeat Chest Xray in 6-8 weeks  PRIMARY DISCHARGE DIAGNOSIS:  Principal Problem:   CAP (community acquired pneumonia) Active Problems:   HYPERTENSION, BENIGN   Community acquired pneumonia   ARF (acute renal failure)      PAST MEDICAL HISTORY: Past Medical History  Diagnosis Date  . HTN (hypertension)   . Hypothyroidism   . Erectile dysfunction   . Adenomatous colon polyp 2012    Bx 2012 showed tubular adenoma but no HG dysplasia  . Hearing loss of both ears     +hearing aids  . Increased prostate specific antigen (PSA) velocity 11/11/2011    Biopsy negative 03/11/12.  Followed by Dr. Alinda Money (mild rise in PSA 2015, but Dr. Alinda Money and pt agreed to continue with annual PSA/DRE, no invasive procedure).    DISCHARGE MEDICATIONS:   Medication List         aspirin EC 81 MG tablet  Take 81 mg by mouth daily.     atorvastatin 10 MG tablet  Commonly known as:  LIPITOR  Take 1 tablet (10 mg total) by mouth daily.     benzonatate 200 MG capsule  Commonly known as:  TESSALON  Take 1 capsule (200 mg total) by mouth 3 (three) times daily as needed for cough.     CALCIUM 600 600 MG Tabs tablet  Generic drug:  calcium carbonate  Take 600 mg by mouth daily.     CENTRUM SILVER PO  Take 1 tablet by mouth daily.     chlorpheniramine-HYDROcodone 10-8 MG/5ML Lqcr  Commonly known as:  TUSSIONEX PENNKINETIC ER  Take 5 mLs by mouth at bedtime as needed for cough.     Fish Oil 1000 MG Caps  Take 1 capsule by mouth daily.     guaiFENesin 600 MG 12 hr tablet  Commonly known as:  MUCINEX  Take 2 tablets (1,200 mg total) by mouth 2 (two) times daily.     guaiFENesin-dextromethorphan 100-10 MG/5ML syrup  Commonly known as:  ROBITUSSIN DM   Take 5 mLs by mouth every 4 (four) hours as needed for cough.     ibuprofen 800 MG tablet  Commonly known as:  ADVIL,MOTRIN  Take 800 mg by mouth every 8 (eight) hours as needed for mild pain.     levofloxacin 750 MG tablet  Commonly known as:  LEVAQUIN  Take 1 tablet (750 mg total) by mouth daily.     levothyroxine 125 MCG tablet  Commonly known as:  SYNTHROID, LEVOTHROID  Take 1 tablet (125 mcg total) by mouth daily.     lisinopril 10 MG tablet  Commonly known as:  PRINIVIL,ZESTRIL  Take 1 tablet (10 mg total) by mouth daily.  Start taking on:  03/29/2014     loratadine 10 MG tablet  Commonly known as:  CLARITIN  Take 10 mg by mouth daily.     METAMUCIL PO  Take 1 scoop by mouth daily.     niacin 500 MG tablet  Take 500 mg by mouth daily with breakfast.     oxycodone 5 MG capsule  Commonly known as:  OXY-IR  Take 1 capsule (5 mg total) by mouth every 4 (four) hours as needed.        ALLERGIES:  No Known Allergies  BRIEF HPI:  See H&P,  Labs, Consult and Test reports for all details in brief, Joel Kelley is a 80 y.o. male  has a past medical history of HTN (hypertension); Hypothyroidism; Erectile dysfunction;Adenomatous colon polyp (2012); Hearing loss of both ears; and Increased prostate specific antigen (PSA) velocity (11/11/2011)-Presented with 4 day hx of fevers and cough productive of yellow and sometimes rusty red phlegm.    CONSULTATIONS:   None  PERTINENT RADIOLOGIC STUDIES: Dg Chest 2 View  03/23/2014   CLINICAL DATA:  Cough and shortness of breath and pneumonia  EXAM: CHEST  2 VIEW  COMPARISON:  DG CHEST 2 VIEW dated 03/19/2014  FINDINGS: There is hypoinflation on the right with bibasilar atelectasis and new small right pleural effusion. The left lung is better inflated but exhibits coarse lung markings inferiorly and a trace of costophrenic angle blunting. The cardiac silhouette is top-normal in size. The pulmonary vascularity is not engorged. No acute bony  abnormality is demonstrated.  IMPRESSION: There is worsening atelectasis associated with the pneumonia in the right lower lobe. There is a new small right pleural effusion.   Electronically Signed   By: David  Martinique   On: 03/23/2014 14:59   Dg Chest 2 View  03/19/2014   CLINICAL DATA:  Cough and fever  EXAM: CHEST  2 VIEW  COMPARISON:  July 19, 2013  FINDINGS: There is infiltrate in the posterior right base. Elsewhere lungs are clear. Heart size and pulmonary vascularity are normal. No adenopathy. No bone lesions.  IMPRESSION: Infiltrate posterior right base.   Electronically Signed   By: Lowella Grip M.D.   On: 03/19/2014 14:04     PERTINENT LAB RESULTS: CBC:  Recent Labs  03/23/14 0733  WBC 8.7  HGB 14.2  HCT 41.6  PLT 224   CMET CMP     Component Value Date/Time   NA 137 03/23/2014 0733   K 3.7 03/23/2014 0733   CL 99 03/23/2014 0733   CO2 26 03/23/2014 0733   GLUCOSE 97 03/23/2014 0733   BUN 15 03/23/2014 0733   CREATININE 0.97 03/23/2014 0733   CALCIUM 8.8 03/23/2014 0733   PROT 5.6* 03/20/2014 0346   ALBUMIN 2.4* 03/20/2014 0346   AST 12 03/20/2014 0346   ALT 9 03/20/2014 0346   ALKPHOS 108 03/20/2014 0346   BILITOT 0.9 03/20/2014 0346   GFRNONAA 78* 03/23/2014 0733   GFRAA 90* 03/23/2014 0733    GFR Estimated Creatinine Clearance: 62.8 ml/min (by C-G formula based on Cr of 0.97). No results found for this basename: LIPASE, AMYLASE,  in the last 72 hours No results found for this basename: CKTOTAL, CKMB, CKMBINDEX, TROPONINI,  in the last 72 hours No components found with this basename: POCBNP,  No results found for this basename: DDIMER,  in the last 72 hours No results found for this basename: HGBA1C,  in the last 72 hours No results found for this basename: CHOL, HDL, LDLCALC, TRIG, CHOLHDL, LDLDIRECT,  in the last 72 hours No results found for this basename: TSH, T4TOTAL, FREET3, T3FREE, THYROIDAB,  in the last 72 hours No results found for this basename:  VITAMINB12, FOLATE, FERRITIN, TIBC, IRON, RETICCTPCT,  in the last 72 hours Coags: No results found for this basename: PT, INR,  in the last 72 hours Microbiology: Recent Results (from the past 240 hour(s))  CULTURE, EXPECTORATED SPUTUM-ASSESSMENT     Status: None   Collection Time    03/20/14 12:09 AM      Result Value Ref Range Status   Specimen Description  SPUTUM   Final   Special Requests Normal   Final   Sputum evaluation     Final   Value: THIS SPECIMEN IS ACCEPTABLE. RESPIRATORY CULTURE REPORT TO FOLLOW.   Report Status 03/20/2014 FINAL   Final  CULTURE, RESPIRATORY (NON-EXPECTORATED)     Status: None   Collection Time    03/20/14 12:09 AM      Result Value Ref Range Status   Specimen Description SPUTUM   Final   Special Requests ADDED 0056   Final   Gram Stain     Final   Value: ABUNDANT WBC PRESENT,BOTH PMN AND MONONUCLEAR     NO SQUAMOUS EPITHELIAL CELLS SEEN     NO ORGANISMS SEEN     Performed at [facility]   Culture     Final   Value: NORMAL OROPHARYNGEAL FLORA     Performed at [facility]   Report Status 03/22/2014 FINAL   Final     BRIEF HOSPITAL COURSE:  Acute Hypoxic Resp Failure  -secondary to CAP  -now no longer requiring O2  -lower ext Doppler neg   PNA  -significantly improved by day of discharge, ambulating in the hall with no difficulties -afebrile, leukocytosis has resolved, no longer hypoxic on ambulation  -was on Rocephin/Zithromax-started 5/16-will transition to Levaquin on discharge -sputum culture negative, Urine strep Pneumo/Legionella negative   ARF  -resolved-likely pre-renal   HTN  -controlled without the use of antihypertensives, Lisinopril will be resumed on discharge  Hypothyroidism  -stable, c/w Levothyroxine  TODAY-DAY OF DISCHARGE:  Subjective:   Kameren Letendre today has no headache,no chest abdominal pain,no new weakness tingling or numbness, feels much better wants to go home today.   Objective:    Blood pressure 116/66, pulse 86, temperature 97.5 F (36.4 C), temperature source Oral, resp. rate 18, height 5\' 6"  (1.676 m), weight 78.291 kg (172 lb 9.6 oz), SpO2 91.00%.  Intake/Output Summary (Last 24 hours) at 03/24/14 1509 Last data filed at 03/24/14 1040  Gross per 24 hour  Intake     50 ml  Output      0 ml  Net     50 ml   Filed Weights   03/19/14 1318 03/19/14 1824  Weight: 78.926 kg (174 lb) 78.291 kg (172 lb 9.6 oz)    Exam Awake Alert, Oriented *3, No new F.N deficits, Normal affect [place].AT,PERRAL Supple Neck,No JVD, No cervical lymphadenopathy appriciated.  Symmetrical Chest wall movement, Good air movement bilaterally, CTAB RRR,No Gallops,Rubs or new Murmurs, No Parasternal Heave +ve B.Sounds, Abd Soft, Non tender, No organomegaly appriciated, No rebound -guarding or rigidity. No Cyanosis, Clubbing or edema, No new Rash or bruise  DISCHARGE CONDITION: Stable  DISPOSITION: Home  DISCHARGE INSTRUCTIONS:    Activity:  As tolerated   Diet recommendation: Heart Healthy diet      Discharge Instructions   Diet - low sodium heart healthy    Complete by:  As directed      Diet - low sodium heart healthy    Complete by:  As directed      Diet - low sodium heart healthy    Complete by:  As directed      Increase activity slowly    Complete by:  As directed      Increase activity slowly    Complete by:  As directed      Increase activity slowly    Complete by:  As directed  Follow-up Information   Follow up with Tammi Sou, MD On 03/31/2014. (Appointment with Dr. Anitra Lauth is on 03/31/14 at 8:30 am)    Specialty:  Family Medicine   Contact information:   [address] [place] [address] [phone]       Total Time spent on discharge equals 45 minutes.  Signed: Henreitta Leber Ghimire 03/24/2014 3:09 PM  **Disclaimer: This note may have been dictated with voice recognition software. Similar sounding words can inadvertently  be transcribed and this note may contain transcription errors which may not have been corrected upon publication of note.**

## 2014-03-31 ENCOUNTER — Other Ambulatory Visit: Payer: Federal, State, Local not specified - PPO | Admitting: Family Medicine

## 2014-03-31 ENCOUNTER — Ambulatory Visit (INDEPENDENT_AMBULATORY_CARE_PROVIDER_SITE_OTHER): Payer: Federal, State, Local not specified - PPO | Admitting: Family Medicine

## 2014-03-31 ENCOUNTER — Ambulatory Visit (HOSPITAL_BASED_OUTPATIENT_CLINIC_OR_DEPARTMENT_OTHER)
Admission: RE | Admit: 2014-03-31 | Discharge: 2014-03-31 | Disposition: A | Discharge status: Home / Self Care | Payer: Federal, State, Local not specified - PPO | Source: Ambulatory Visit | Attending: Family Medicine | Admitting: Family Medicine

## 2014-03-31 ENCOUNTER — Other Ambulatory Visit: Payer: Self-pay | Admitting: Family Medicine

## 2014-03-31 ENCOUNTER — Encounter: Payer: Self-pay | Admitting: Family Medicine

## 2014-03-31 VITALS — BP 116/69 | HR 86 | Temp 98.0°F | Resp 18 | Ht 65.0 in | Wt 163.0 lb

## 2014-03-31 DIAGNOSIS — J189 Pneumonia, unspecified organism: Secondary | ICD-10-CM

## 2014-03-31 DIAGNOSIS — N179 Acute kidney failure, unspecified: Secondary | ICD-10-CM

## 2014-03-31 DIAGNOSIS — J918 Pleural effusion in other conditions classified elsewhere: Principal | ICD-10-CM

## 2014-03-31 DIAGNOSIS — J90 Pleural effusion, not elsewhere classified: Secondary | ICD-10-CM

## 2014-03-31 DIAGNOSIS — K5900 Constipation, unspecified: Secondary | ICD-10-CM

## 2014-03-31 DIAGNOSIS — R5381 Other malaise: Secondary | ICD-10-CM

## 2014-03-31 DIAGNOSIS — R5383 Other fatigue: Secondary | ICD-10-CM

## 2014-03-31 LAB — CBC WITH DIFFERENTIAL/PLATELET
Basophils Absolute: 0 10*3/uL (ref 0.0–0.1)
Basophils Relative: 0.1 % (ref 0.0–3.0)
EOS ABS: 0.1 10*3/uL (ref 0.0–0.7)
Eosinophils Relative: 1.8 % (ref 0.0–5.0)
HCT: 42.5 % (ref 39.0–52.0)
Hemoglobin: 14.2 g/dL (ref 13.0–17.0)
Lymphocytes Relative: 12.5 % (ref 12.0–46.0)
Lymphs Abs: 1 10*3/uL (ref 0.7–4.0)
MCHC: 33.5 g/dL (ref 30.0–36.0)
MCV: 96 fl (ref 78.0–100.0)
Monocytes Absolute: 0.9 10*3/uL (ref 0.1–1.0)
Monocytes Relative: 10.7 % (ref 3.0–12.0)
NEUTROS PCT: 74.9 % (ref 43.0–77.0)
Neutro Abs: 6.1 10*3/uL (ref 1.4–7.7)
Platelets: 605 10*3/uL — ABNORMAL HIGH (ref 150.0–400.0)
RBC: 4.43 Mil/uL (ref 4.22–5.81)
RDW: 13.6 % (ref 11.5–15.5)
WBC: 8.2 10*3/uL (ref 4.0–10.5)

## 2014-03-31 LAB — BASIC METABOLIC PANEL
BUN: 11 mg/dL (ref 6–23)
CO2: 26 mEq/L (ref 19–32)
Calcium: 8.6 mg/dL (ref 8.4–10.5)
Chloride: 104 mEq/L (ref 96–112)
Creatinine, Ser: 1.1 mg/dL (ref 0.4–1.5)
GFR: 71.07 mL/min (ref >60.00)
Glucose, Bld: 91 mg/dL (ref 70–99)
POTASSIUM: 4.7 meq/L (ref 3.5–5.1)
SODIUM: 137 meq/L (ref 135–145)

## 2014-03-31 NOTE — Progress Notes (Signed)
OFFICE NOTE  03/31/2014  CC:  Chief Complaint  Patient presents with  . Hospitalization Follow-up  . Pneumonia     HPI: Patient is a 80 y.o.  male who is here for 1 wk f/u pneumonia, hospitalized 5/16-5/21, 2015. Had ARF that resolved.  His bp was good in hosp w/out his lisinopril and he has not restarted this med.  No home bp's to report.  He had cephalo/macrolide in hosp and transitioned to fluoroquinolone at d/c.  Needs f/u CXR in 6-8 wks. Still with quite a bit of fatigue.  Still hurting in right post thorax.  Breathing feels ok.  Finished abx a few days ago.  No fevers.  Was constipated, finally had bm recently, taking only metamucil.  Was on pain pills but stopped them recently b/c he is tired of taking them.   Pertinent PMH:  Past medical, surgical, social, and family history reviewed and no changes are noted since last office visit.  MEDS:  Outpatient Prescriptions Prior to Visit  Medication Sig Dispense Refill  . aspirin EC 81 MG tablet Take 81 mg by mouth daily.       Marland Kitchen atorvastatin (LIPITOR) 10 MG tablet Take 1 tablet (10 mg total) by mouth daily.  30 tablet  5  . calcium carbonate (CALCIUM 600) 600 MG TABS Take 600 mg by mouth daily.       Marland Kitchen guaiFENesin (MUCINEX) 600 MG 12 hr tablet Take 2 tablets (1,200 mg total) by mouth 2 (two) times daily.      Marland Kitchen ibuprofen (ADVIL,MOTRIN) 800 MG tablet Take 800 mg by mouth every 8 (eight) hours as needed for mild pain.      Marland Kitchen levothyroxine (SYNTHROID, LEVOTHROID) 125 MCG tablet Take 1 tablet (125 mcg total) by mouth daily.  90 tablet  1  . loratadine (CLARITIN) 10 MG tablet Take 10 mg by mouth daily.        . Multiple Vitamins-Minerals (CENTRUM SILVER PO) Take 1 tablet by mouth daily.        . niacin 500 MG tablet Take 500 mg by mouth daily with breakfast.       . Omega-3 Fatty Acids (FISH OIL) 1000 MG CAPS Take 1 capsule by mouth daily.       . Psyllium (METAMUCIL PO) Take 1 scoop by mouth daily.       . benzonatate (TESSALON) 200  MG capsule Take 1 capsule (200 mg total) by mouth 3 (three) times daily as needed for cough.  30 capsule  0  . chlorpheniramine-HYDROcodone (TUSSIONEX PENNKINETIC ER) 10-8 MG/5ML LQCR Take 5 mLs by mouth at bedtime as needed for cough.  115 mL  0  . guaiFENesin-dextromethorphan (ROBITUSSIN DM) 100-10 MG/5ML syrup Take 5 mLs by mouth every 4 (four) hours as needed for cough.  118 mL  0  . lisinopril (PRINIVIL,ZESTRIL) 10 MG tablet Take 1 tablet (10 mg total) by mouth daily.      Marland Kitchen oxycodone (OXY-IR) 5 MG capsule Take 1 capsule (5 mg total) by mouth every 4 (four) hours as needed.  30 capsule  0  . levofloxacin (LEVAQUIN) 750 MG tablet Take 1 tablet (750 mg total) by mouth daily.  5 tablet  0   No facility-administered medications prior to visit.    PE: Blood pressure 116/69, pulse 86, temperature 98 F (36.7 C), temperature source Temporal, resp. rate 18, height 5\' 5"  (1.651 m), weight 163 lb (73.936 kg), SpO2 93.00%. Gen: tired appearing but alert, NAD.  Oriented x 4.  KGS:UPJS: no injection, icteris, swelling, or exudate.  EOMI, PERRLA. Mouth: lips without lesion/swelling.  Oral mucosa pink and moist. Oropharynx without erythema, exudate, or swelling.  CV: RRR, no m/r/g LUNGS: Right posterior inspiratory crackles from base to 1/2 way up. Nonlabored resps. EXT: no clubbing, cyanosis, or edema.    IMPRESSION AND PLAN:  1) CAP; s/p abx and hospitalization for oxygen support. Slow to recover, still with lots of fatigue and some DOE. Will recheck CBC, bMET and CXR today.  2) ARF in hosp--resolved, bp normal off lisinopril.  Go ahead and remain off lisinopril at this time. Recheck BMET today.  3) Constipation: should improve off of pain meds.  Also recommended senakot S 2 tabs qhs.  An After Visit Summary was printed and given to the patient.  FOLLOW UP: 1 wk

## 2014-03-31 NOTE — Progress Notes (Signed)
Pre visit review using our clinic review tool, if applicable. No additional management support is needed unless otherwise documented below in the visit note. 

## 2014-04-01 ENCOUNTER — Other Ambulatory Visit: Payer: Self-pay | Admitting: Family Medicine

## 2014-04-01 ENCOUNTER — Ambulatory Visit (HOSPITAL_COMMUNITY)
Admission: RE | Admit: 2014-04-01 | Discharge: 2014-04-01 | Disposition: A | Discharge status: Home / Self Care | Payer: Federal, State, Local not specified - PPO | Source: Ambulatory Visit | Attending: Family Medicine | Admitting: Family Medicine

## 2014-04-01 ENCOUNTER — Ambulatory Visit (HOSPITAL_COMMUNITY)
Admission: RE | Admit: 2014-04-01 | Discharge: 2014-04-01 | Disposition: A | Discharge status: Home / Self Care | Payer: Federal, State, Local not specified - PPO | Source: Ambulatory Visit | Attending: Radiology | Admitting: Radiology

## 2014-04-01 VITALS — BP 103/62

## 2014-04-01 DIAGNOSIS — J189 Pneumonia, unspecified organism: Secondary | ICD-10-CM | POA: Insufficient documentation

## 2014-04-01 DIAGNOSIS — J90 Pleural effusion, not elsewhere classified: Secondary | ICD-10-CM

## 2014-04-01 LAB — BODY FLUID CELL COUNT WITH DIFFERENTIAL
Lymphs, Fluid: 33 %
Monocyte-Macrophage-Serous Fluid: 62 % (ref 50–90)
NEUTROPHIL FLUID: 5 % (ref 0–25)
WBC FLUID: 182 uL (ref 0–1000)

## 2014-04-01 LAB — PROTEIN, BODY FLUID: Total protein, fluid: 4.9 g/dL

## 2014-04-01 LAB — LACTATE DEHYDROGENASE, PLEURAL OR PERITONEAL FLUID: LD, Fluid: 255 U/L — ABNORMAL HIGH (ref 3–23)

## 2014-04-01 NOTE — Procedures (Signed)
US guided diagnostic right thoracentesis performed yielding 180 cc's turbid, amber fluid. The fluid was sent to the lab for preordered studies. F/u CXR pending. No immediate complications.

## 2014-04-04 DIAGNOSIS — J189 Pneumonia, unspecified organism: Secondary | ICD-10-CM

## 2014-04-04 HISTORY — DX: Pneumonia, unspecified organism: J18.9

## 2014-04-05 LAB — BODY FLUID CULTURE
Culture: NO GROWTH
GRAM STAIN: NONE SEEN
SPECIAL REQUESTS: NORMAL

## 2014-04-07 ENCOUNTER — Ambulatory Visit (INDEPENDENT_AMBULATORY_CARE_PROVIDER_SITE_OTHER): Payer: Federal, State, Local not specified - PPO | Admitting: Family Medicine

## 2014-04-07 ENCOUNTER — Encounter: Payer: Self-pay | Admitting: Family Medicine

## 2014-04-07 VITALS — BP 120/68 | HR 77 | Temp 98.1°F | Resp 16 | Ht 65.0 in | Wt 162.0 lb

## 2014-04-07 DIAGNOSIS — J90 Pleural effusion, not elsewhere classified: Secondary | ICD-10-CM

## 2014-04-07 DIAGNOSIS — J189 Pneumonia, unspecified organism: Secondary | ICD-10-CM

## 2014-04-07 NOTE — Progress Notes (Signed)
OFFICE NOTE  04/07/2014  CC:  Chief Complaint  Patient presents with  . Follow-up     HPI: Patient is a 80 y.o.  male who is here for 1 wk f/u CAP with para-pneumonic effusion. Thoracentesis 03/31/14: no malignant cells noted, exudative properties (protein and LDH), no AFB, fungal cultures pending. No signif cough, no SOB, no fever.  Still feels fatigued but says this is slowly improving. Eating/drinking well.  Bowels moving fine.   Pertinent PMH:  Past medical, surgical, social, and family history reviewed and no changes noted since last office visit.  MEDS: Not taking tessalon or oxycodone Outpatient Prescriptions Prior to Visit  Medication Sig Dispense Refill  . aspirin EC 81 MG tablet Take 81 mg by mouth daily.       Marland Kitchen atorvastatin (LIPITOR) 10 MG tablet TAKE 1 TABLET BY MOUTH DAILY  30 tablet  2  . calcium carbonate (CALCIUM 600) 600 MG TABS Take 600 mg by mouth daily.       Marland Kitchen guaiFENesin (MUCINEX) 600 MG 12 hr tablet Take 2 tablets (1,200 mg total) by mouth 2 (two) times daily.      Marland Kitchen guaiFENesin-dextromethorphan (ROBITUSSIN DM) 100-10 MG/5ML syrup Take 5 mLs by mouth every 4 (four) hours as needed for cough.  118 mL  0  . ibuprofen (ADVIL,MOTRIN) 800 MG tablet Take 800 mg by mouth every 8 (eight) hours as needed for mild pain.      Marland Kitchen levothyroxine (SYNTHROID, LEVOTHROID) 125 MCG tablet Take 1 tablet (125 mcg total) by mouth daily.  90 tablet  1  . loratadine (CLARITIN) 10 MG tablet Take 10 mg by mouth daily.        . Multiple Vitamins-Minerals (CENTRUM SILVER PO) Take 1 tablet by mouth daily.        . niacin 500 MG tablet Take 500 mg by mouth daily with breakfast.       . Omega-3 Fatty Acids (FISH OIL) 1000 MG CAPS Take 1 capsule by mouth daily.       . Psyllium (METAMUCIL PO) Take 1 scoop by mouth daily.       Marland Kitchen lisinopril (PRINIVIL,ZESTRIL) 10 MG tablet Take 1 tablet (10 mg total) by mouth daily.      . benzonatate (TESSALON) 200 MG capsule Take 1 capsule (200 mg total) by  mouth 3 (three) times daily as needed for cough.  30 capsule  0  . chlorpheniramine-HYDROcodone (TUSSIONEX PENNKINETIC ER) 10-8 MG/5ML LQCR Take 5 mLs by mouth at bedtime as needed for cough.  115 mL  0  . oxycodone (OXY-IR) 5 MG capsule Take 1 capsule (5 mg total) by mouth every 4 (four) hours as needed.  30 capsule  0  . atorvastatin (LIPITOR) 10 MG tablet Take 1 tablet (10 mg total) by mouth daily.  30 tablet  5   No facility-administered medications prior to visit.    PE: Blood pressure 120/68, pulse 77, temperature 98.1 F (36.7 C), temperature source Temporal, resp. rate 16, height 5\' 5"  (1.651 m), weight 162 lb (73.483 kg), SpO2 96.00%. Gen: Alert, well appearing.  Patient is oriented to person, place, time, and situation. BJY:NWGN: no injection, icteris, swelling, or exudate.  EOMI, PERRLA. Mouth: lips without lesion/swelling.  Oral mucosa pink and moist. Oropharynx without erythema, exudate, or swelling.  CV: RRR, no m/r/g LUNGS: Diminished BS in right lung from base to about 1/2 way up posterior lung field.  No significant crackles, no wheezing, no prolonged exp phase.  Breathing nonlabored.  Minimal E to A changes in right post lung field.  EXT: no clubbing, cyanosis, or edema.   LABS: none today See HPI above re: thoracentesis  IMPRESSION AND PLAN:  1) CAP with para-pneumonic effusion: at this time he is clinically stable and we'll continue with watchful waiting approach, no new meds at this time. We'll recheck CXR in 4 d (monday, the 8th) to see if fluid has re-accumulated. F/u in office in 1 week.    An After Visit Summary was printed and given to the patient.

## 2014-04-07 NOTE — Progress Notes (Signed)
Pre visit review using our clinic review tool, if applicable. No additional management support is needed unless otherwise documented below in the visit note. 

## 2014-04-11 ENCOUNTER — Ambulatory Visit (HOSPITAL_BASED_OUTPATIENT_CLINIC_OR_DEPARTMENT_OTHER)
Admission: RE | Admit: 2014-04-11 | Discharge: 2014-04-11 | Disposition: A | Discharge status: Home / Self Care | Payer: Federal, State, Local not specified - PPO | Source: Ambulatory Visit | Attending: Family Medicine | Admitting: Family Medicine

## 2014-04-11 DIAGNOSIS — J90 Pleural effusion, not elsewhere classified: Secondary | ICD-10-CM | POA: Insufficient documentation

## 2014-04-11 DIAGNOSIS — J189 Pneumonia, unspecified organism: Secondary | ICD-10-CM

## 2014-04-11 DIAGNOSIS — J9819 Other pulmonary collapse: Secondary | ICD-10-CM | POA: Insufficient documentation

## 2014-04-12 ENCOUNTER — Other Ambulatory Visit: Payer: Self-pay | Admitting: Family Medicine

## 2014-04-12 DIAGNOSIS — J189 Pneumonia, unspecified organism: Secondary | ICD-10-CM

## 2014-04-12 DIAGNOSIS — J918 Pleural effusion in other conditions classified elsewhere: Principal | ICD-10-CM

## 2014-04-14 ENCOUNTER — Ambulatory Visit: Payer: Federal, State, Local not specified - PPO | Admitting: Family Medicine

## 2014-04-18 ENCOUNTER — Ambulatory Visit (INDEPENDENT_AMBULATORY_CARE_PROVIDER_SITE_OTHER): Payer: Federal, State, Local not specified - PPO | Admitting: Internal Medicine

## 2014-04-18 ENCOUNTER — Encounter: Payer: Self-pay | Admitting: Internal Medicine

## 2014-04-18 VITALS — BP 140/72 | HR 78 | Temp 97.8°F | Ht 66.0 in | Wt 166.2 lb

## 2014-04-18 DIAGNOSIS — J90 Pleural effusion, not elsewhere classified: Secondary | ICD-10-CM

## 2014-04-18 DIAGNOSIS — J189 Pneumonia, unspecified organism: Secondary | ICD-10-CM

## 2014-04-18 NOTE — Progress Notes (Signed)
Subjective:    Patient ID: Joel Kelley, male    DOB: 11/07/1933  MRN: [id number]  HPI  60 yowm quit smoking 1990 with pna ? Lung completely recover/ then again in 2012 recovered completely and then started with cough around first of 2015 comes and goes away for weeks at time then regular visit Mar 10 2014 for physical s cxr feeling baseline and "no findings" then one week later acutely worse much worse cough productive of yellow mucus, weak, fever > UC around May 16 > admitted   Admit Date: 03/19/2014  Admitting Physician: Toy Baker, MD  VQQ:VZDGLOV,FIEPPI H, MD  Recommendations for Outpatient Follow-up:  1. Please repeat Chest Xray in 6-8 weeks PRIMARY DISCHARGE DIAGNOSIS:  Principal Problem:  CAP (community acquired pneumonia)  Active Problems:  HYPERTENSION, BENIGN  Community acquired pneumonia  ARF (acute renal failure)  PAST MEDICAL HISTORY:  Past Medical History   Diagnosis  Date   .  HTN (hypertension)    .  Hypothyroidism    .  Erectile dysfunction    .  Adenomatous colon polyp  2012     Bx 2012 showed tubular adenoma but no HG dysplasia   .  Hearing loss of both ears      +hearing aids   .  Increased prostate specific antigen (PSA) velocity  11/11/2011     Biopsy negative 03/11/12. Followed by Dr. Alinda Money (mild rise in PSA 2015, but Dr. Alinda Money and pt agreed to continue with annual PSA/DRE, no invasive procedure).   DISCHARGE MEDICATIONS:    Medication List         aspirin EC 81 MG tablet    Take 81 mg by mouth daily.    atorvastatin 10 MG tablet    Commonly known as: LIPITOR    Take 1 tablet (10 mg total) by mouth daily.    benzonatate 200 MG capsule    Commonly known as: TESSALON    Take 1 capsule (200 mg total) by mouth 3 (three) times daily as needed for cough.    CALCIUM 600 600 MG Tabs tablet    Generic drug: calcium carbonate    Take 600 mg by mouth daily.    CENTRUM SILVER PO    Take 1 tablet by mouth daily.    chlorpheniramine-HYDROcodone 10-8  MG/5ML Lqcr    Commonly known as: TUSSIONEX PENNKINETIC ER    Take 5 mLs by mouth at bedtime as needed for cough.    Fish Oil 1000 MG Caps    Take 1 capsule by mouth daily.    guaiFENesin 600 MG 12 hr tablet    Commonly known as: MUCINEX    Take 2 tablets (1,200 mg total) by mouth 2 (two) times daily.    guaiFENesin-dextromethorphan 100-10 MG/5ML syrup    Commonly known as: ROBITUSSIN DM    Take 5 mLs by mouth every 4 (four) hours as needed for cough.    ibuprofen 800 MG tablet    Commonly known as: ADVIL,MOTRIN    Take 800 mg by mouth every 8 (eight) hours as needed for mild pain.    levofloxacin 750 MG tablet    Commonly known as: LEVAQUIN    Take 1 tablet (750 mg total) by mouth daily.    levothyroxine 125 MCG tablet    Commonly known as: SYNTHROID, LEVOTHROID    Take 1 tablet (125 mcg total) by mouth daily.    lisinopril 10 MG tablet    Commonly known as: PRINIVIL,ZESTRIL  Take 1 tablet (10 mg total) by mouth daily.    Start taking on: 03/29/2014    loratadine 10 MG tablet    Commonly known as: CLARITIN    Take 10 mg by mouth daily.    METAMUCIL PO    Take 1 scoop by mouth daily.    niacin 500 MG tablet    Take 500 mg by mouth daily with breakfast.    oxycodone 5 MG capsule    Commonly known as: OXY-IR    Take 1 capsule (5 mg total) by mouth every 4 (four) hours as needed.      04/18/2014 1st [facility] office visit/ Joel Kelley off ACEi x one week Chief Complaint  Patient presents with  . Pulmonary Consult    Referred per Dr. Anitra Lauth for eval of Parapneumonic Effusion. Pt c/o cough since 5/7/5 after having Prevnar injection.  Cough states cough is prod with minimal white sputum.    acei d/c one week prior to OV  And cough is improving. No recent dental problems/ dental work or h/o ca or connective tissue dz   No obvious other patterns in day to day or daytime variabilty or assoc sob or cp or chest tightness, subjective wheeze overt sinus or hb symptoms. No unusual  exp hx or h/o childhood pna/ asthma or knowledge of premature birth.  Sleeping ok without nocturnal  or early am exacerbation  of respiratory  c/o's or need for noct saba. Also denies any obvious fluctuation of symptoms with weather or environmental changes or other aggravating or alleviating factors except as outlined above   Current Medications, Allergies, Complete Past Medical History, Past Surgical History, Family History, and Social History were reviewed in [employer] [employer] record.            Review of Systems  Constitutional: Negative for fever, chills, activity change, appetite change and unexpected weight change.  HENT: Positive for dental problem. Negative for congestion, postnasal drip, rhinorrhea, sneezing, sore throat, trouble swallowing and voice change.   Eyes: Negative for visual disturbance.  Respiratory: Positive for cough. Negative for choking and shortness of breath.   Cardiovascular: Negative for chest pain and leg swelling.  Gastrointestinal: Negative for nausea, vomiting and abdominal pain.  Genitourinary: Negative for difficulty urinating.  Musculoskeletal: Negative for arthralgias.  Skin: Negative for rash.  Psychiatric/Behavioral: Negative for behavioral problems and confusion.       Objective:   Physical Exam  amb wm nad  Wt Readings from Last 3 Encounters:  04/18/14 166 lb 3.2 oz (75.388 kg)  04/07/14 162 lb (73.483 kg)  03/31/14 163 lb (73.936 kg)      HEENT: nl dentition, turbinates, and orophanx. Nl external ear canals without cough reflex   NECK :  without JVD/Nodes/TM/ nl carotid upstrokes bilaterally   LUNGS: no acc muscle use, clear to A and P bilaterally without cough on insp or exp maneuvers   CV:  RRR  no s3 or murmur or increase in P2, no edema   ABD:  soft and nontender with nl excursion in the supine position. No bruits or organomegaly, bowel sounds nl  MS:  warm without deformities, calf tenderness, cyanosis or  clubbing  SKIN: warm and dry without lesions    NEURO:  alert, approp, no deficits     cxr 07/19/13  Prior left lung opacities have resolved. Minimal chronic pleural  accentuation bilaterally, likely incidental. Thoracic spondylosis is  present.  Symmetric bibasilar nodular single densities favor nipple shadows.  The lungs appear clear. Cardiac and mediastinal margins appear  Normal.   04/10/14 A total of approximately 180 cc's of slightly turbid, amber fluid  04/11/14 Persistent small right pleural effusion with right basilar  atelectasis or infiltrate. No pulmonary edema         Assessment & Plan:

## 2014-04-18 NOTE — Patient Instructions (Signed)
Most likely the cxr changes and fluid are just the residual from an infection that has resolved and it will take more time for your cxr to normalize   Please schedule a follow up office visit in 4 weeks, sooner if needed with cxr on return

## 2014-04-19 DIAGNOSIS — J90 Pleural effusion, not elsewhere classified: Secondary | ICD-10-CM | POA: Insufficient documentation

## 2014-04-19 NOTE — Assessment & Plan Note (Signed)
No evidence of an anatomical (note different area on this pna from prev) or immune def issue here and not he's already received pneumovax and prevnar so nothing to add to his excellent care by his primary.

## 2014-04-19 NOTE — Assessment & Plan Note (Signed)
Tcentesis 04/01/14  WBC 182 with 33 L/ 5P Prot 4.9 and LDH 255 no glucose, cyt > reactive mesothelial cells  Likely parapneumonic uncomplicated effusion with persistent symptoms ? Related to ACEi now off and starting to feel better  Discussed in detail all the  indications, usual  risks and alternatives  relative to the benefits with patient who agrees to proceed with conservative f/u with cxr in 4 weeks.

## 2014-05-14 LAB — AFB CULTURE WITH SMEAR (NOT AT ARMC)
Acid Fast Smear: NONE SEEN
SPECIAL REQUESTS: NORMAL

## 2014-05-16 ENCOUNTER — Encounter: Payer: Self-pay | Admitting: Internal Medicine

## 2014-05-16 ENCOUNTER — Ambulatory Visit (INDEPENDENT_AMBULATORY_CARE_PROVIDER_SITE_OTHER): Payer: Federal, State, Local not specified - PPO | Admitting: Internal Medicine

## 2014-05-16 ENCOUNTER — Ambulatory Visit (INDEPENDENT_AMBULATORY_CARE_PROVIDER_SITE_OTHER)
Admission: RE | Admit: 2014-05-16 | Discharge: 2014-05-16 | Disposition: A | Discharge status: Home / Self Care | Payer: Federal, State, Local not specified - PPO | Source: Ambulatory Visit | Attending: Internal Medicine | Admitting: Internal Medicine

## 2014-05-16 VITALS — BP 112/70 | HR 75 | Temp 97.8°F | Ht 66.5 in | Wt 166.0 lb

## 2014-05-16 DIAGNOSIS — J90 Pleural effusion, not elsewhere classified: Secondary | ICD-10-CM

## 2014-05-16 NOTE — Progress Notes (Signed)
Subjective:    Patient ID: Joel Kelley, male    DOB: [birth year]  MRN: [id number]  HPI  69 yowm quit smoking 1990 with pna ? Lung completely recover/ then again in 2012 recovered completely and then started with cough around first of 2015 comes and goes away for weeks at time then regular visit Mar 10 2014 for physical s cxr feeling baseline and "no findings" then one week later acutely worse much worse cough productive of yellow mucus, weak, fever > UC around May 16 > admitted   Admit Date: 03/19/2014  Admitting Physician: Toy Baker, MD  PTW:[id number],NTZGYF H, MD  Recommendations for Outpatient Follow-up:  1. Please repeat Chest Xray in 6-8 weeks PRIMARY DISCHARGE DIAGNOSIS:  Principal Problem:  CAP (community acquired pneumonia)  Active Problems:  HYPERTENSION, BENIGN  Community acquired pneumonia  ARF (acute renal failure)  PAST MEDICAL HISTORY:  Past Medical History   Diagnosis  Date   .  HTN (hypertension)    .  Hypothyroidism    .  Erectile dysfunction    .  Adenomatous colon polyp  2012     Bx 2012 showed tubular adenoma but no HG dysplasia   .  Hearing loss of both ears      +hearing aids   .  Increased prostate specific antigen (PSA) velocity  11/11/2011     Biopsy negative 03/11/12. Followed by Dr. Alinda Money (mild rise in PSA 2015, but Dr. Alinda Money and pt agreed to continue with annual PSA/DRE, no invasive procedure).   DISCHARGE MEDICATIONS:    Medication List         aspirin EC 81 MG tablet    Take 81 mg by mouth daily.    atorvastatin 10 MG tablet    Commonly known as: LIPITOR    Take 1 tablet (10 mg total) by mouth daily.    benzonatate 200 MG capsule    Commonly known as: TESSALON    Take 1 capsule (200 mg total) by mouth 3 (three) times daily as needed for cough.    CALCIUM 600 600 MG Tabs tablet    Generic drug: calcium carbonate    Take 600 mg by mouth daily.    CENTRUM SILVER PO    Take 1 tablet by mouth daily.    chlorpheniramine-HYDROcodone 10-8  MG/5ML Lqcr    Commonly known as: TUSSIONEX PENNKINETIC ER    Take 5 mLs by mouth at bedtime as needed for cough.    Fish Oil 1000 MG Caps    Take 1 capsule by mouth daily.    guaiFENesin 600 MG 12 hr tablet    Commonly known as: MUCINEX    Take 2 tablets (1,200 mg total) by mouth 2 (two) times daily.    guaiFENesin-dextromethorphan 100-10 MG/5ML syrup    Commonly known as: ROBITUSSIN DM    Take 5 mLs by mouth every 4 (four) hours as needed for cough.    ibuprofen 800 MG tablet    Commonly known as: ADVIL,MOTRIN    Take 800 mg by mouth every 8 (eight) hours as needed for mild pain.    levofloxacin 750 MG tablet    Commonly known as: LEVAQUIN    Take 1 tablet (750 mg total) by mouth daily.    levothyroxine 125 MCG tablet    Commonly known as: SYNTHROID, LEVOTHROID    Take 1 tablet (125 mcg total) by mouth daily.    lisinopril 10 MG tablet    Commonly known as: PRINIVIL,ZESTRIL  Take 1 tablet (10 mg total) by mouth daily.    Start taking on: 03/29/2014    loratadine 10 MG tablet    Commonly known as: CLARITIN    Take 10 mg by mouth daily.    METAMUCIL PO    Take 1 scoop by mouth daily.    niacin 500 MG tablet    Take 500 mg by mouth daily with breakfast.    oxycodone 5 MG capsule    Commonly known as: OXY-IR    Take 1 capsule (5 mg total) by mouth every 4 (four) hours as needed.      04/18/2014 1st [facility] office visit/ Shaunna Rosetti off ACEi x one week Chief Complaint  Patient presents with  . Pulmonary Consult    Referred per Dr. Anitra Lauth for eval of Parapneumonic Effusion. Pt c/o cough since 5/7/5 after having Prevnar injection.  Cough states cough is prod with minimal white sputum.    acei d/c one week prior to OV  And cough is improving. No recent dental problems/ dental work or h/o ca or connective tissue dz  rec  no change rx   05/16/2014 f/u ov/Issabella Rix re: f/u parapneumonic effusion  Chief Complaint  Patient presents with  . Followup with CXR    Pt reports cough  is much improved, but still coughs up minimal clear sputum occ. No new co's today.     Not limited by breathing from desired activities   No noct or early am cough   No obvious day to day or daytime variabilty or assoc chronic cough or cp or chest tightness, subjective wheeze overt sinus or hb symptoms. No unusual exp hx or h/o childhood pna/ asthma or knowledge of premature birth.  Sleeping ok without nocturnal  or early am exacerbation  of respiratory  c/o's or need for noct saba. Also denies any obvious fluctuation of symptoms with weather or environmental changes or other aggravating or alleviating factors except as outlined above   Current Medications, Allergies, Complete Past Medical History, Past Surgical History, Family History, and Social History were reviewed in [employer] record.  ROS  The following are not active complaints unless bolded sore throat, dysphagia, dental problems, itching, sneezing,  nasal congestion or excess/ purulent secretions, ear ache,   fever, chills, sweats, unintended wt loss, pleuritic or exertional cp, hemoptysis,  orthopnea pnd or leg swelling, presyncope, palpitations, heartburn, abdominal pain, anorexia, nausea, vomiting, diarrhea  or change in bowel or urinary habits, change in stools or urine, dysuria,hematuria,  rash, arthralgias, visual complaints, headache, numbness weakness or ataxia or problems with walking or coordination,  change in mood/affect or memory.                 Objective:   Physical Exam  amb wm nad  05/16/2014       166 Wt Readings from Last 3 Encounters:  04/18/14 166 lb 3.2 oz (75.388 kg)  04/07/14 162 lb (73.483 kg)  03/31/14 163 lb (73.936 kg)      HEENT: nl dentition, turbinates, and orophanx. Nl external ear canals without cough reflex   NECK :  without JVD/Nodes/TM/ nl carotid upstrokes bilaterally   LUNGS: no acc muscle use, clear to A and P bilaterally without cough on insp or exp  maneuvers   CV:  RRR  no s3 or murmur or increase in P2, no edema   ABD:  soft and nontender with nl excursion in the supine position. No bruits or organomegaly, bowel sounds  nl  MS:  warm without deformities, calf tenderness, cyanosis or clubbing  SKIN: warm and dry without lesions    NEURO:  alert, approp, no deficits     cxr 07/19/13  Prior left lung opacities have resolved. Minimal chronic pleural  accentuation bilaterally, likely incidental. Thoracic spondylosis is  present.  Symmetric bibasilar nodular single densities favor nipple shadows.  The lungs appear clear. Cardiac and mediastinal margins appear  Normal.   04/10/14 A total of approximately 180 cc's of slightly turbid, amber fluid  04/11/14 Persistent small right pleural effusion with right basilar  atelectasis or infiltrate. No pulmonary edema    CXR  05/16/2014 : Continued improvement in pleural parenchymal opacification at the base of the right hemi thorax, without complete resolution. Findings may indicate developing scarring.        Assessment & Plan:

## 2014-05-16 NOTE — Patient Instructions (Signed)
Your chest xray shows minimal scarring  Pulmonary follow up is as needed

## 2014-05-16 NOTE — Assessment & Plan Note (Signed)
Tcentesis 04/01/14  WBC 182 with 33 L/ 5P Prot 4.9 and LDH 255 no glucose, cyt > reactive mesothelial cells  Clearly recovered at this point with minimal scarrng typical of a benign parapneumonic effusion.  Pulmonary f/u is prn

## 2014-05-20 ENCOUNTER — Other Ambulatory Visit: Payer: Self-pay | Admitting: Family Medicine

## 2014-06-08 ENCOUNTER — Other Ambulatory Visit: Payer: Self-pay | Admitting: Family Medicine

## 2014-06-24 IMAGING — CR DG CHEST 2V
2 series · 2 of 2 positions shown · non-contrast
Comparison: 04/01/2014

CLINICAL DATA: Followup pneumonia

EXAM:
CHEST  2 VIEW

[w chest pa]
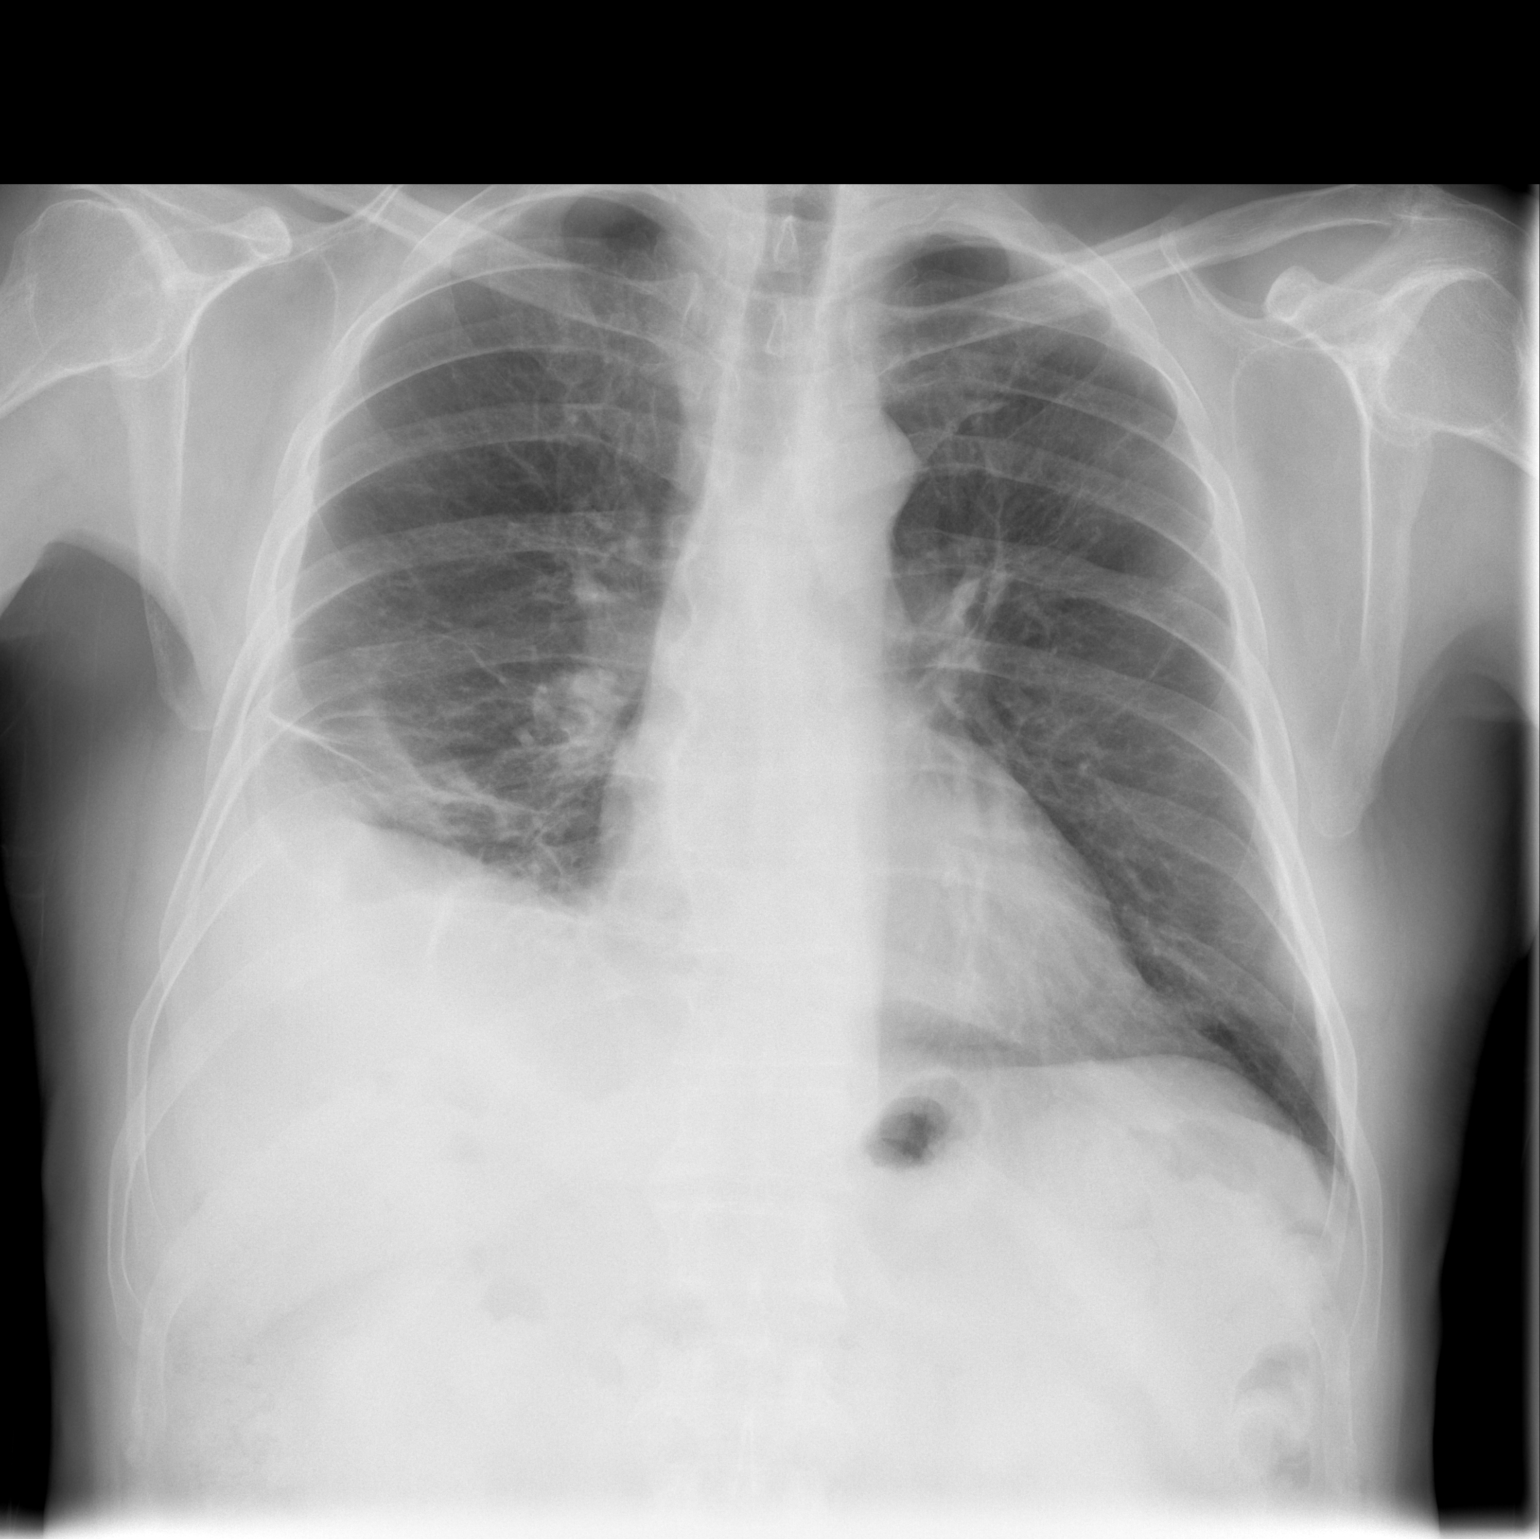

[w chest lat]
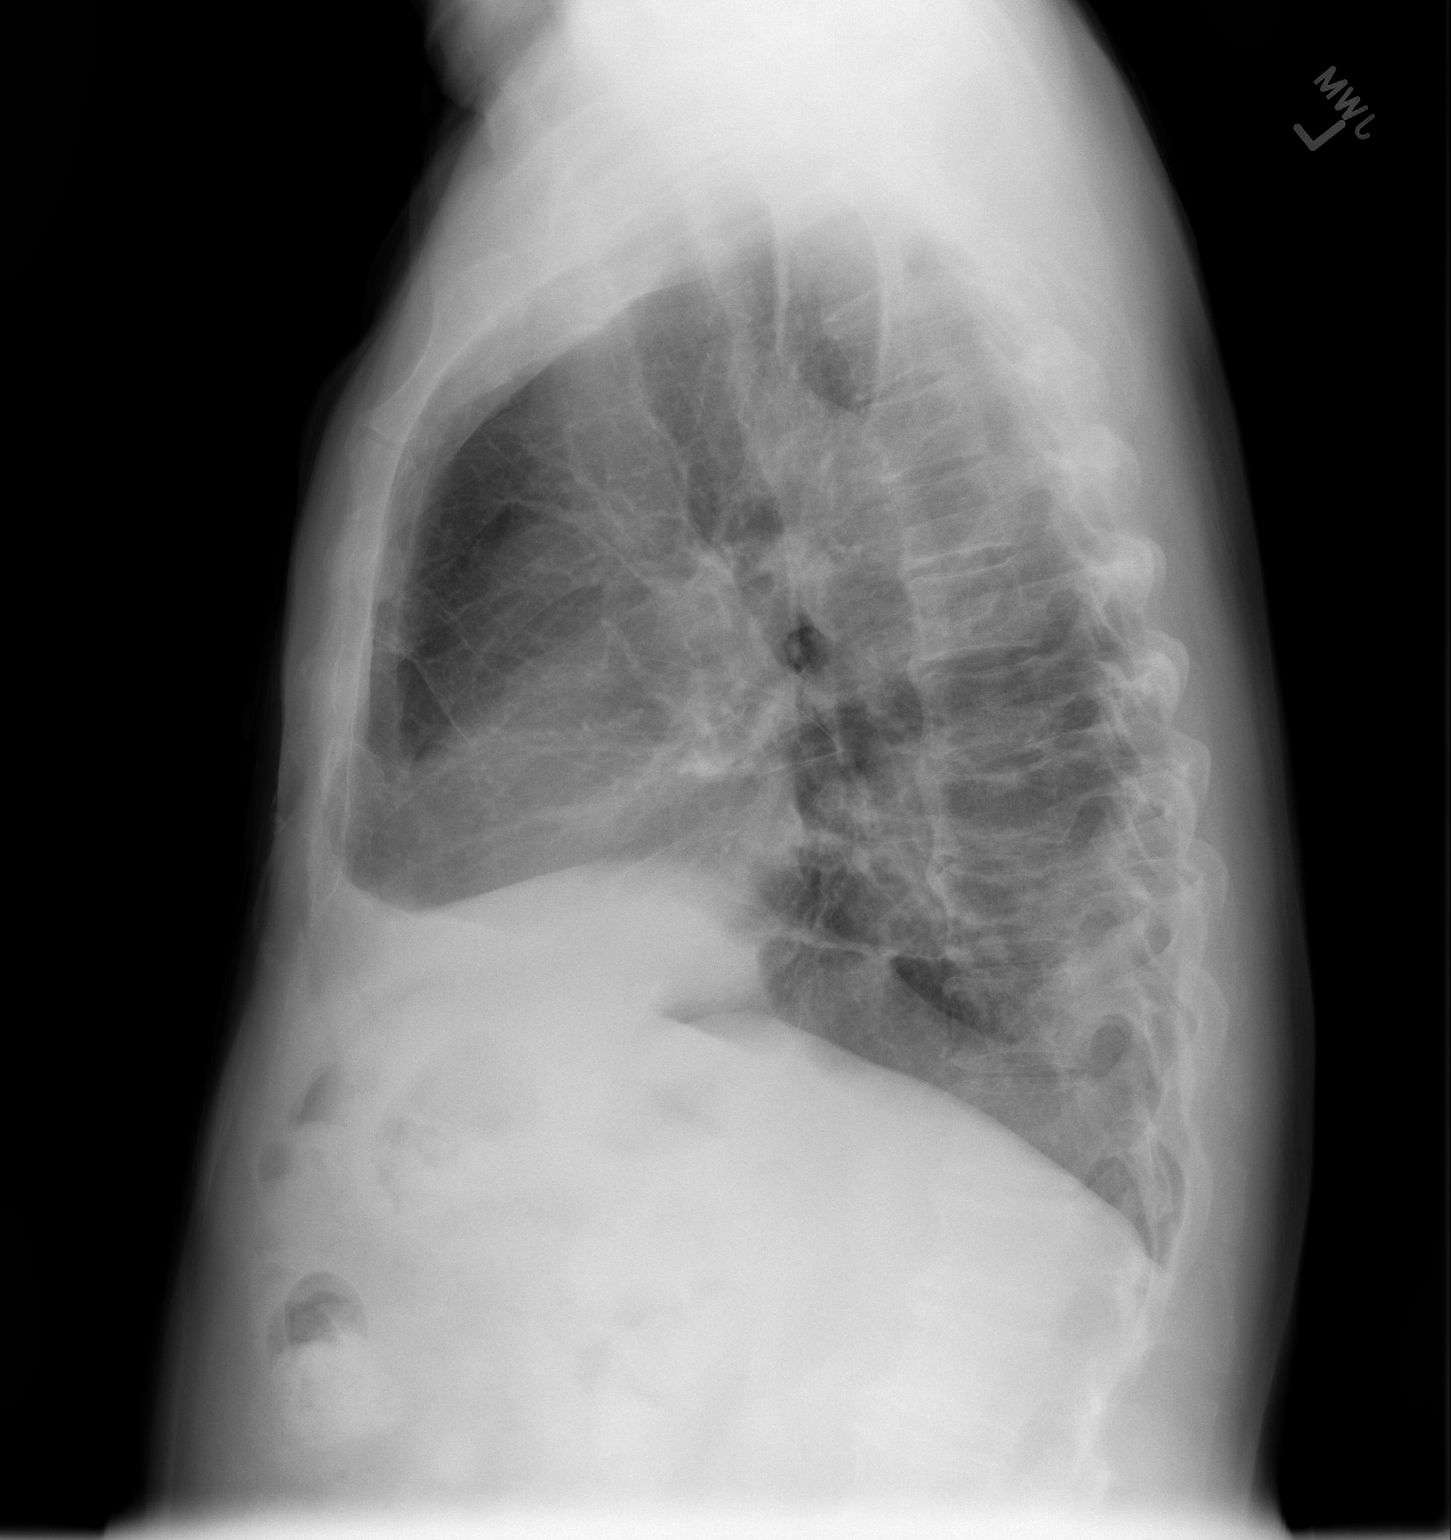

[2 of 2 positions shown; findings below may reference images not displayed]

FINDINGS: Cardiomediastinal silhouette is stable. Elevation of the right
hemidiaphragm again noted. Persistent small right pleural effusion
with right basilar atelectasis or infiltrate. No pulmonary edema.
Left lung is clear. Degenerative changes thoracic spine.
IMPRESSION: Persistent small right pleural effusion with right basilar
atelectasis or infiltrate. No pulmonary edema.

## 2014-06-27 ENCOUNTER — Ambulatory Visit (HOSPITAL_BASED_OUTPATIENT_CLINIC_OR_DEPARTMENT_OTHER)
Admission: RE | Admit: 2014-06-27 | Discharge: 2014-06-27 | Disposition: A | Discharge status: Home / Self Care | Payer: Federal, State, Local not specified - PPO | Source: Ambulatory Visit | Attending: Family Medicine | Admitting: Family Medicine

## 2014-06-27 ENCOUNTER — Encounter: Payer: Self-pay | Admitting: Family Medicine

## 2014-06-27 ENCOUNTER — Other Ambulatory Visit: Payer: Self-pay | Admitting: Family Medicine

## 2014-06-27 ENCOUNTER — Ambulatory Visit (INDEPENDENT_AMBULATORY_CARE_PROVIDER_SITE_OTHER): Payer: Federal, State, Local not specified - PPO | Admitting: Family Medicine

## 2014-06-27 VITALS — BP 120/74 | HR 67 | Temp 98.0°F | Resp 16 | Ht 65.0 in | Wt 163.0 lb

## 2014-06-27 DIAGNOSIS — R059 Cough, unspecified: Secondary | ICD-10-CM

## 2014-06-27 DIAGNOSIS — J9819 Other pulmonary collapse: Secondary | ICD-10-CM | POA: Diagnosis not present

## 2014-06-27 DIAGNOSIS — R509 Fever, unspecified: Secondary | ICD-10-CM | POA: Insufficient documentation

## 2014-06-27 DIAGNOSIS — Z87891 Personal history of nicotine dependence: Secondary | ICD-10-CM | POA: Insufficient documentation

## 2014-06-27 DIAGNOSIS — R9431 Abnormal electrocardiogram [ECG] [EKG]: Secondary | ICD-10-CM

## 2014-06-27 DIAGNOSIS — R5381 Other malaise: Secondary | ICD-10-CM

## 2014-06-27 DIAGNOSIS — R0609 Other forms of dyspnea: Secondary | ICD-10-CM

## 2014-06-27 DIAGNOSIS — R05 Cough: Secondary | ICD-10-CM

## 2014-06-27 DIAGNOSIS — R42 Dizziness and giddiness: Secondary | ICD-10-CM

## 2014-06-27 DIAGNOSIS — R5383 Other fatigue: Secondary | ICD-10-CM

## 2014-06-27 DIAGNOSIS — R0989 Other specified symptoms and signs involving the circulatory and respiratory systems: Secondary | ICD-10-CM

## 2014-06-27 LAB — CBC WITH DIFFERENTIAL/PLATELET
Basophils Absolute: 0 10*3/uL (ref 0.0–0.1)
Basophils Relative: 0.7 % (ref 0.0–3.0)
EOS PCT: 4.2 % (ref 0.0–5.0)
Eosinophils Absolute: 0.3 10*3/uL (ref 0.0–0.7)
HEMATOCRIT: 48.1 % (ref 39.0–52.0)
Hemoglobin: 15.9 g/dL (ref 13.0–17.0)
LYMPHS ABS: 2 10*3/uL (ref 0.7–4.0)
LYMPHS PCT: 31.7 % (ref 12.0–46.0)
MCHC: 33.1 g/dL (ref 30.0–36.0)
MCV: 95.3 fl (ref 78.0–100.0)
MONOS PCT: 7.9 % (ref 3.0–12.0)
Monocytes Absolute: 0.5 10*3/uL (ref 0.1–1.0)
Neutro Abs: 3.5 10*3/uL (ref 1.4–7.7)
Neutrophils Relative %: 55.5 % (ref 43.0–77.0)
PLATELETS: 210 10*3/uL (ref 150.0–400.0)
RBC: 5.05 Mil/uL (ref 4.22–5.81)
RDW: 13.8 % (ref 11.5–15.5)
WBC: 6.4 10*3/uL (ref 4.0–10.5)

## 2014-06-27 LAB — COMPREHENSIVE METABOLIC PANEL
ALBUMIN: 3.8 g/dL (ref 3.5–5.2)
ALT: 15 U/L (ref 0–53)
AST: 21 U/L (ref 0–37)
Alkaline Phosphatase: 60 U/L (ref 39–117)
BUN: 13 mg/dL (ref 6–23)
CHLORIDE: 105 meq/L (ref 96–112)
CO2: 31 mEq/L (ref 19–32)
Calcium: 9.1 mg/dL (ref 8.4–10.5)
Creatinine, Ser: 1 mg/dL (ref 0.4–1.5)
GFR: 75.92 mL/min (ref >60.00)
GLUCOSE: 92 mg/dL (ref 70–99)
Potassium: 4.4 mEq/L (ref 3.5–5.1)
Sodium: 141 mEq/L (ref 135–145)
Total Bilirubin: 0.9 mg/dL (ref 0.2–1.2)
Total Protein: 6.6 g/dL (ref 6.0–8.3)

## 2014-06-27 MED ORDER — ATORVASTATIN CALCIUM 10 MG PO TABS: ORAL_TABLET | ORAL | Status: DC

## 2014-06-27 NOTE — Progress Notes (Signed)
Pre visit review using our clinic review tool, if applicable. No additional management support is needed unless otherwise documented below in the visit note. 

## 2014-06-27 NOTE — Addendum Note (Signed)
Addended by: Tammi Sou on: 06/27/2014 10:14 AM   Modules accepted: Orders

## 2014-06-27 NOTE — Progress Notes (Addendum)
OFFICE NOTE  06/27/2014  CC:  Chief Complaint  Patient presents with  . Cough  . Dizziness     HPI: Patient is a 80 y.o. Caucasian male who is here for acute onset 4 d/a of fatigue, DOE, a period of "wobbly" kind of disequilibrium the day of onset.  Just weak since then.  No fevers, no change in chronic productive cough.  No chest pain.  Hurts in upper abd/lower rib cage with cough.  Appetite good.  No known sick contacts. No nausea,   Pertinent PMH:  Past medical, surgical, social, and family history reviewed and no changes are noted since last office visit.  MEDS:  Outpatient Prescriptions Prior to Visit  Medication Sig Dispense Refill  . aspirin EC 81 MG tablet Take 81 mg by mouth daily.       Marland Kitchen atorvastatin (LIPITOR) 10 MG tablet TAKE 1 TABLET BY MOUTH DAILY  30 tablet  2  . calcium carbonate (CALCIUM 600) 600 MG TABS Take 600 mg by mouth daily.       Marland Kitchen ibuprofen (ADVIL,MOTRIN) 800 MG tablet Take 800 mg by mouth every 8 (eight) hours as needed for mild pain.      Marland Kitchen levothyroxine (SYNTHROID, LEVOTHROID) 125 MCG tablet TAKE 1 TABLET BY MOUTH DAILY  90 tablet  0  . lisinopril (PRINIVIL,ZESTRIL) 10 MG tablet TAKE 1 TABLET BY MOUTH DAILY  90 tablet  0  . loratadine (CLARITIN) 10 MG tablet Take 10 mg by mouth daily.        . Multiple Vitamins-Minerals (CENTRUM SILVER PO) Take 1 tablet by mouth daily.        . niacin 500 MG tablet Take 500 mg by mouth daily with breakfast.       . Omega-3 Fatty Acids (FISH OIL) 1000 MG CAPS Take 1 capsule by mouth daily.       . Psyllium (METAMUCIL PO) Take 1 scoop by mouth daily.       . benzonatate (TESSALON) 200 MG capsule Take 1 capsule (200 mg total) by mouth 3 (three) times daily as needed for cough.  30 capsule  0   No facility-administered medications prior to visit.    PE: Blood pressure 120/74, pulse 67, temperature 98 F (36.7 C), temperature source Temporal, resp. rate 16, height 5\' 5"  (1.651 m), weight 163 lb (73.936 kg), SpO2  98.00%. Gen: Alert, well appearing.  Patient is oriented to person, place, time, and situation. ENT: Ears: EACs clear, normal epithelium.  TMs with good light reflex and landmarks bilaterally.  Eyes: no injection, icteris, swelling, or exudate.  EOMI, PERRLA. Nose: no drainage or turbinate edema/swelling.  No injection or focal lesion.  Mouth: lips without lesion/swelling.  Oral mucosa pink and moist.  Dentition intact and without obvious caries or gingival swelling.  Oropharynx without erythema, exudate, or swelling.  Neck: supple/nontender.  No LAD, mass, or TM.  Carotid pulses 2+ bilaterally, without bruits. CV: RRR, no m/r/g LUNGS: mild decreased BS on right compared to left, particularly in right base.  No e to a changes. Nonlabored resps.  No wheezing.  No crackles. EXT: no clubbing, cyanosis, or edema.   LAB: none today 12 lead EKG today: (no prior for comparison): NSR, rate 68, Incomplete RBBB, left axis, anterior fascicular block.  No ectopy.  IMPRESSION AND PLAN:  Acute illness: fatigue, DOE, ongoing productive cough, relatively recent hx of benign parapneumonic effusion. Will check CXR and CBC, CMET today. However, with his nonspecific EKG abnormalities and no prior  EKG to compare to, I will set him up for nuclear stress test ASAP. Signs/symptoms to call or return for were reviewed and pt expressed understanding. No new meds rx'd at this time.  FOLLOW UP: 1 wk

## 2014-07-06 ENCOUNTER — Ambulatory Visit (HOSPITAL_COMMUNITY): Payer: Federal, State, Local not specified - PPO | Attending: Family Medicine | Admitting: Radiology

## 2014-07-06 VITALS — BP 133/78 | Ht 65.0 in | Wt 164.0 lb

## 2014-07-06 DIAGNOSIS — R55 Syncope and collapse: Secondary | ICD-10-CM | POA: Insufficient documentation

## 2014-07-06 DIAGNOSIS — R0989 Other specified symptoms and signs involving the circulatory and respiratory systems: Secondary | ICD-10-CM | POA: Diagnosis present

## 2014-07-06 DIAGNOSIS — R0609 Other forms of dyspnea: Secondary | ICD-10-CM | POA: Insufficient documentation

## 2014-07-06 DIAGNOSIS — R5383 Other fatigue: Secondary | ICD-10-CM | POA: Diagnosis not present

## 2014-07-06 DIAGNOSIS — R42 Dizziness and giddiness: Secondary | ICD-10-CM | POA: Insufficient documentation

## 2014-07-06 DIAGNOSIS — R9431 Abnormal electrocardiogram [ECG] [EKG]: Secondary | ICD-10-CM | POA: Insufficient documentation

## 2014-07-06 DIAGNOSIS — R5381 Other malaise: Secondary | ICD-10-CM | POA: Diagnosis not present

## 2014-07-06 DIAGNOSIS — I4510 Unspecified right bundle-branch block: Secondary | ICD-10-CM

## 2014-07-06 DIAGNOSIS — I10 Essential (primary) hypertension: Secondary | ICD-10-CM | POA: Diagnosis not present

## 2014-07-06 DIAGNOSIS — R0602 Shortness of breath: Secondary | ICD-10-CM

## 2014-07-06 HISTORY — PX: CARDIOVASCULAR STRESS TEST: SHX262

## 2014-07-06 MED ORDER — TECHNETIUM TC 99M SESTAMIBI GENERIC - CARDIOLITE
33.0000 | Freq: Once | INTRAVENOUS | Status: AC | PRN
  Administered 2014-07-06: 33 via INTRAVENOUS

## 2014-07-06 MED ORDER — TECHNETIUM TC 99M SESTAMIBI GENERIC - CARDIOLITE
11.0000 | Freq: Once | INTRAVENOUS | Status: AC | PRN
  Administered 2014-07-06: 11 via INTRAVENOUS

## 2014-07-06 NOTE — Progress Notes (Signed)
[facility] [address], [place] [address] [phone]    Cardiology Nuclear Med Study  Joel Kelley is a 80 y.o. male     MRN : [id number]     DOB: 20-Dec-1933  Procedure Date: 07/06/2014  Nuclear Med Background Indication for Stress Test:  Evaluation for Ischemia and Abnormal EKG History:No Known History of CAD; Cardiac Risk Factors: Hypertension and IRBBB  Symptoms:  Dizziness, DOE, Fatigue and Near Syncope   Nuclear Pre-Procedure Caffeine/Decaff Intake:  None> 12 hrs NPO After: 6:30pm   Lungs:  clear O2 Sat: 96% on room air. IV 0.9% NS with Angio Cath:  20g  IV Site: R Antecubital x 1. Tolerated well IV Started by:  Irven Baltimore, RN  Chest Size (in):  42 Cup Size: n/a  Height: 5\' 5"  (1.651 m)  Weight:  164 lb (74.39 kg)  BMI:  Body mass index is 27.29 kg/(m^2). Tech Comments:  N/A    Nuclear Med Study 1 or 2 day study: 1 day  Stress Test Type:  Stress  Reading MD: N/A  Order Authorizing Provider:  Shawnie Dapper, MD  Resting Radionuclide: Technetium 64m Sestamibi  Resting Radionuclide Dose: 11.0 mCi   Stress Radionuclide:  Technetium 65m Sestamibi  Stress Radionuclide Dose: 33.0 mCi           Stress Protocol Rest HR: 60 Stress HR: 130  Rest BP: 133/78 Stress BP: 180/78  Exercise Time (min): 7:00 METS: 8.50   Predicted Max HR: 142 bpm % Max HR: 91.55 bpm Rate Pressure Product: 23400   Dose of Adenosine (mg):  n/a Dose of Lexiscan: n/a mg  Dose of Atropine (mg): n/a Dose of Dobutamine: n/a mcg/kg/min (at max HR)  Stress Test Technologist: Ileene Hutchinson, EMT-P  Nuclear Technologist:  Vedia Pereyra, CNMT     Rest Procedure:  Myocardial perfusion imaging was performed at rest 45 minutes following the intravenous administration of Technetium 42m Sestamibi. Rest ECG: NSR - Normal EKG and NSR-RBBB  Stress Procedure:  The patient exercised on the treadmill utilizing the Bruce Protocol for 7:00 minutes. The patient stopped  due to sob and denied any chest pain.  Technetium 45m Sestamibi was injected at peak exercise and myocardial perfusion imaging was performed after a brief delay. Stress ECG: No significant ST segment change suggestive of ischemia.  QPS Raw Data Images:  Normal; no motion artifact; normal heart/lung ratio. Stress Images:  Normal homogeneous uptake in all areas of the myocardium. Rest Images:  Normal homogeneous uptake in all areas of the myocardium. Subtraction (SDS):  No evidence of ischemia. Transient Ischemic Dilatation (Normal <1.22):  0.93 Lung/Heart Ratio (Normal <0.45):  0.41  Quantitative Gated Spect Images QGS EDV:  83 ml QGS ESV:  38 ml  Impression Exercise Capacity:  Good exercise capacity. BP Response:  Normal blood pressure response. Clinical Symptoms:  No significant symptoms noted. ECG Impression:  No significant ST segment change suggestive of ischemia. Comparison with Prior Nuclear Study: No images to compare  Overall Impression:  Normal stress nuclear study.  No evidence of ischemia   LV Ejection Fraction: 55%.  LV Wall Motion:  NL LV Function; NL Wall Motion   Joel Kelley, Joel Kelley., MD, [facility] 07/06/2014, 5:17 PM [address]  [place] Pager [phone]

## 2014-07-07 ENCOUNTER — Other Ambulatory Visit: Payer: Self-pay | Admitting: Family Medicine

## 2014-07-07 ENCOUNTER — Encounter: Payer: Self-pay | Admitting: Family Medicine

## 2014-07-07 ENCOUNTER — Ambulatory Visit (INDEPENDENT_AMBULATORY_CARE_PROVIDER_SITE_OTHER): Payer: Federal, State, Local not specified - PPO | Admitting: Family Medicine

## 2014-07-07 VITALS — BP 129/70 | HR 77 | Temp 98.5°F | Resp 16 | Ht 65.0 in | Wt 164.0 lb

## 2014-07-07 DIAGNOSIS — R5381 Other malaise: Secondary | ICD-10-CM

## 2014-07-07 DIAGNOSIS — R5383 Other fatigue: Secondary | ICD-10-CM

## 2014-07-07 DIAGNOSIS — R9389 Abnormal findings on diagnostic imaging of other specified body structures: Secondary | ICD-10-CM

## 2014-07-07 DIAGNOSIS — J90 Pleural effusion, not elsewhere classified: Secondary | ICD-10-CM

## 2014-07-07 DIAGNOSIS — R0609 Other forms of dyspnea: Secondary | ICD-10-CM

## 2014-07-07 DIAGNOSIS — R55 Syncope and collapse: Secondary | ICD-10-CM

## 2014-07-07 DIAGNOSIS — R059 Cough, unspecified: Secondary | ICD-10-CM

## 2014-07-07 DIAGNOSIS — R0989 Other specified symptoms and signs involving the circulatory and respiratory systems: Secondary | ICD-10-CM

## 2014-07-07 DIAGNOSIS — R05 Cough: Secondary | ICD-10-CM

## 2014-07-07 MED ORDER — CLARITHROMYCIN 500 MG PO TABS
ORAL_TABLET | ORAL | Status: DC
Start: 2014-07-07 — End: 2014-09-09

## 2014-07-07 NOTE — Progress Notes (Signed)
OFFICE NOTE  07/07/2014  CC:  Chief Complaint  Patient presents with  . Follow-up     HPI: Patient is a 80 y.o. Caucasian male who is here for 10d f/u recent fatigue, DOE, ongoing productive cough.  He had nonspecific EKG abnormalities and I set him up for a nuclear stress study.  Also, CXR was unchanged/no acute CP process, and his CBC and CMET were all normal.  Says the exercising yesterday on treadmill caused no symptoms at all.  No result available yet. No new symptoms, no dizziness.   Still with DOE and productive cough.  No fevers.  Appetite good.  No chest pain.  Pertinent PMH:  Past medical, surgical, social, and family history reviewed and no changes are noted since last office visit.  MEDS:  Outpatient Prescriptions Prior to Visit  Medication Sig Dispense Refill  . aspirin EC 81 MG tablet Take 81 mg by mouth daily.       Marland Kitchen atorvastatin (LIPITOR) 10 MG tablet TAKE 1 TABLET BY MOUTH DAILY  30 tablet  3  . calcium carbonate (CALCIUM 600) 600 MG TABS Take 600 mg by mouth daily.       Marland Kitchen ibuprofen (ADVIL,MOTRIN) 800 MG tablet Take 800 mg by mouth every 8 (eight) hours as needed for mild pain.      Marland Kitchen levothyroxine (SYNTHROID, LEVOTHROID) 125 MCG tablet TAKE 1 TABLET BY MOUTH DAILY  90 tablet  0  . loratadine (CLARITIN) 10 MG tablet Take 10 mg by mouth daily.        . Multiple Vitamins-Minerals (CENTRUM SILVER PO) Take 1 tablet by mouth daily.        . niacin 500 MG tablet Take 500 mg by mouth daily with breakfast.       . Omega-3 Fatty Acids (FISH OIL) 1000 MG CAPS Take 1 capsule by mouth daily.       . Psyllium (METAMUCIL PO) Take 1 scoop by mouth daily.        No facility-administered medications prior to visit.    PE: Blood pressure 129/70, pulse 77, temperature 98.5 F (36.9 C), temperature source Temporal, resp. rate 16, height 5\' 5"  (1.651 m), weight 164 lb (74.39 kg), SpO2 96.00%. Gen: Alert, well appearing.  Patient is oriented to person, place, time, and  situation. CV: RRR, no m/r/g.   LUNGS: CTA bilat, nonlabored resps, good aeration in all lung fields. EXT: no clubbing, cyanosis, or edema.   LAB:  Lab Results  Component Value Date   WBC 6.4 06/27/2014   HGB 15.9 06/27/2014   HCT 48.1 06/27/2014   MCV 95.3 06/27/2014   PLT 210.0 06/27/2014     Chemistry      Component Value Date/Time   NA 141 06/27/2014 0957   K 4.4 06/27/2014 0957   CL 105 06/27/2014 0957   CO2 31 06/27/2014 0957   BUN 13 06/27/2014 0957   CREATININE 1.0 06/27/2014 0957      Component Value Date/Time   CALCIUM 9.1 06/27/2014 0957   ALKPHOS 60 06/27/2014 0957   AST 21 06/27/2014 0957   ALT 15 06/27/2014 0957   BILITOT 0.9 06/27/2014 0957     Lab Results  Component Value Date   TSH 0.42 03/03/2014    IMPRESSION AND PLAN:   DOE, fatigue, --also ongoing productive cough. Overall I want to make sure he is not having cardiac ischemia---stress test results pending. If stress test is normal/unremarkable, then will proceed with empiric antibiotic trial for bacterial chest infection,  and proceed with CT chest to further evaluate his persistent small pleural effusions/CXR abnormalities. Pt expressed understanding of plan, agreed.    An After Visit Summary was printed and given to the patient.  FOLLOW UP: To be determined based on results of pending workup.

## 2014-07-13 ENCOUNTER — Ambulatory Visit (INDEPENDENT_AMBULATORY_CARE_PROVIDER_SITE_OTHER): Payer: Federal, State, Local not specified - PPO

## 2014-07-13 DIAGNOSIS — R55 Syncope and collapse: Secondary | ICD-10-CM

## 2014-07-13 DIAGNOSIS — R059 Cough, unspecified: Secondary | ICD-10-CM

## 2014-07-13 DIAGNOSIS — R0609 Other forms of dyspnea: Secondary | ICD-10-CM

## 2014-07-13 DIAGNOSIS — R05 Cough: Secondary | ICD-10-CM

## 2014-07-13 DIAGNOSIS — R222 Localized swelling, mass and lump, trunk: Secondary | ICD-10-CM

## 2014-07-13 DIAGNOSIS — R5381 Other malaise: Secondary | ICD-10-CM

## 2014-07-13 DIAGNOSIS — J90 Pleural effusion, not elsewhere classified: Secondary | ICD-10-CM

## 2014-07-13 DIAGNOSIS — R9389 Abnormal findings on diagnostic imaging of other specified body structures: Secondary | ICD-10-CM

## 2014-07-13 DIAGNOSIS — R5383 Other fatigue: Secondary | ICD-10-CM

## 2014-07-13 MED ORDER — IOHEXOL 300 MG/ML  SOLN
80.0000 mL | Freq: Once | INTRAMUSCULAR | Status: AC | PRN
  Administered 2014-07-13: 80 mL via INTRAVENOUS

## 2014-07-14 ENCOUNTER — Other Ambulatory Visit: Payer: Federal, State, Local not specified - PPO

## 2014-07-20 ENCOUNTER — Other Ambulatory Visit: Payer: Self-pay | Admitting: Family Medicine

## 2014-07-20 DIAGNOSIS — R9389 Abnormal findings on diagnostic imaging of other specified body structures: Secondary | ICD-10-CM

## 2014-07-29 IMAGING — CR DG CHEST 2V
2 series · 2 of 2 positions shown · non-contrast
Comparison: Chest radiographs dating back to 07/19/2013.

CLINICAL DATA: Followup pleural effusion, cough.

EXAM:
CHEST  2 VIEW

[view not recorded (1 of 2)]
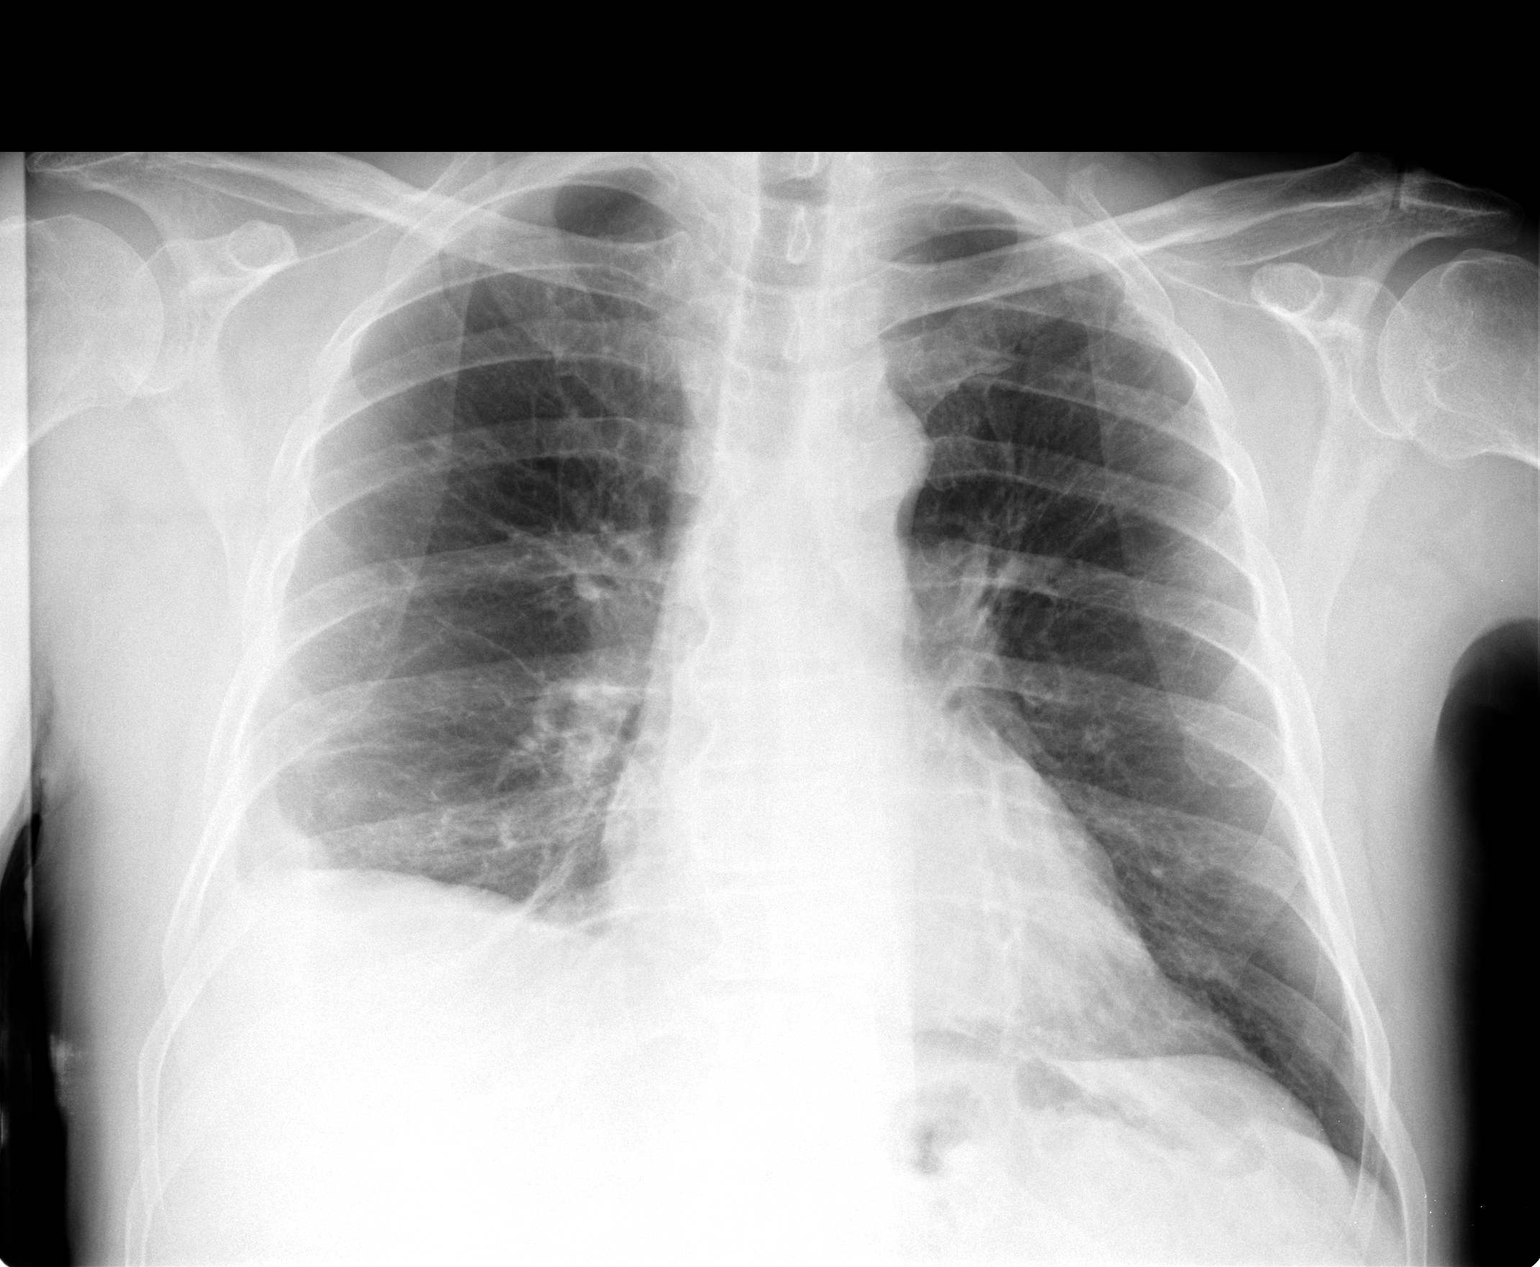

[view not recorded (2 of 2)]
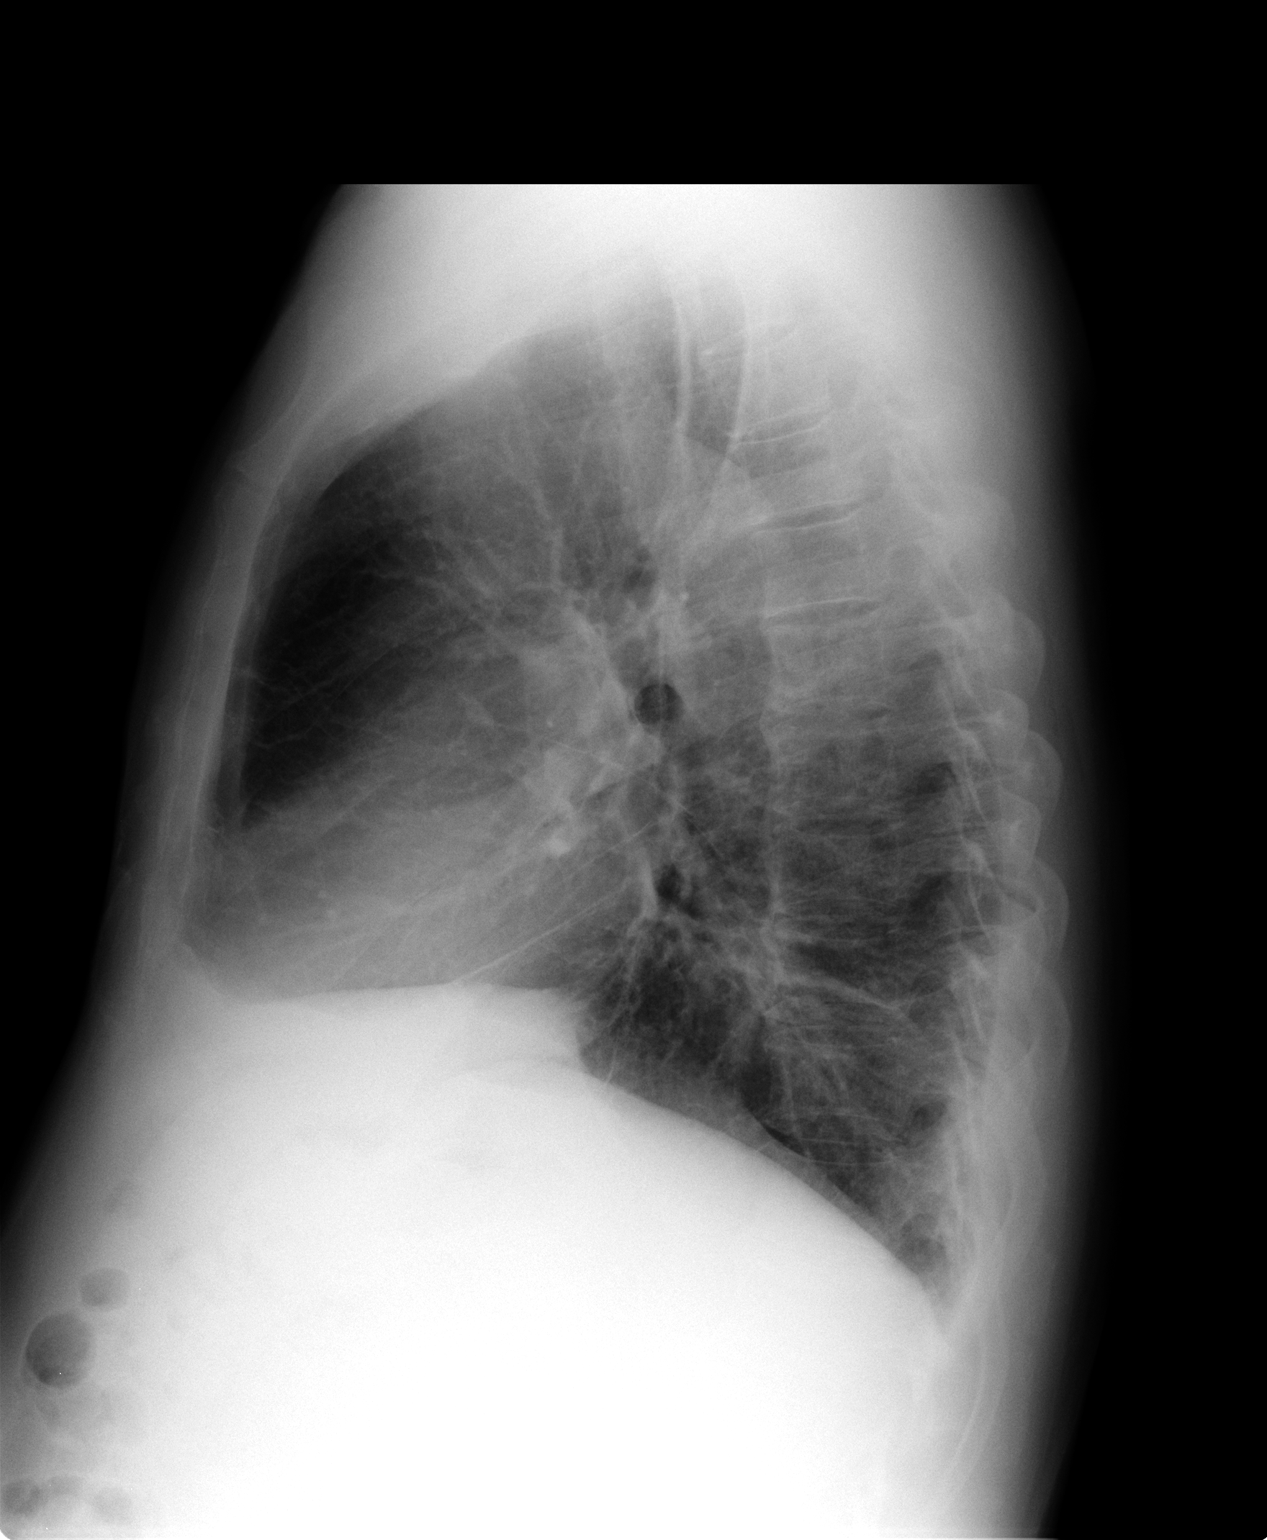

[2 of 2 positions shown; findings below may reference images not displayed]

FINDINGS: Trachea is midline. Heart size normal. Continued improvement in
pleural parenchymal opacification at the base of the right hemi
thorax, without complete resolution. Lungs are otherwise clear. No
pleural fluid. Flowing anterior osteophytosis in the thoracic spine.
IMPRESSION: Continued improvement in pleural parenchymal opacification at the
base of the right hemi thorax, without complete resolution. Findings
may indicate developing scarring.

## 2014-08-10 ENCOUNTER — Other Ambulatory Visit (INDEPENDENT_AMBULATORY_CARE_PROVIDER_SITE_OTHER): Payer: Federal, State, Local not specified - PPO

## 2014-08-10 DIAGNOSIS — I10 Essential (primary) hypertension: Secondary | ICD-10-CM

## 2014-08-10 LAB — BASIC METABOLIC PANEL
BUN: 15 mg/dL (ref 6–23)
CHLORIDE: 108 meq/L (ref 96–112)
CO2: 26 mEq/L (ref 19–32)
Calcium: 8.8 mg/dL (ref 8.4–10.5)
Creatinine, Ser: 1 mg/dL (ref 0.4–1.5)
GFR: 81.45 mL/min (ref >60.00)
Glucose, Bld: 95 mg/dL (ref 70–99)
POTASSIUM: 4.3 meq/L (ref 3.5–5.1)
SODIUM: 141 meq/L (ref 135–145)

## 2014-08-15 ENCOUNTER — Ambulatory Visit (INDEPENDENT_AMBULATORY_CARE_PROVIDER_SITE_OTHER): Payer: Federal, State, Local not specified - PPO

## 2014-08-15 DIAGNOSIS — R911 Solitary pulmonary nodule: Secondary | ICD-10-CM

## 2014-08-15 DIAGNOSIS — R9389 Abnormal findings on diagnostic imaging of other specified body structures: Secondary | ICD-10-CM

## 2014-08-15 MED ORDER — IOHEXOL 300 MG/ML  SOLN
80.0000 mL | Freq: Once | INTRAMUSCULAR | Status: AC | PRN
  Administered 2014-08-15: 80 mL via INTRAVENOUS

## 2014-08-16 ENCOUNTER — Encounter: Payer: Self-pay | Admitting: Family Medicine

## 2014-08-16 ENCOUNTER — Other Ambulatory Visit: Payer: Self-pay | Admitting: Family Medicine

## 2014-08-16 MED ORDER — PREDNISONE 20 MG PO TABS: ORAL_TABLET | ORAL | Status: DC

## 2014-08-21 ENCOUNTER — Other Ambulatory Visit: Payer: Self-pay | Admitting: Family Medicine

## 2014-09-09 ENCOUNTER — Ambulatory Visit: Payer: Federal, State, Local not specified - PPO | Admitting: Family Medicine

## 2014-09-09 ENCOUNTER — Encounter: Payer: Self-pay | Admitting: Family Medicine

## 2014-09-09 VITALS — BP 123/73 | HR 75 | Temp 98.3°F | Resp 16 | Ht 65.0 in | Wt 169.0 lb

## 2014-09-09 DIAGNOSIS — R059 Cough, unspecified: Secondary | ICD-10-CM | POA: Insufficient documentation

## 2014-09-09 DIAGNOSIS — E039 Hypothyroidism, unspecified: Secondary | ICD-10-CM

## 2014-09-09 DIAGNOSIS — R05 Cough: Secondary | ICD-10-CM | POA: Insufficient documentation

## 2014-09-09 DIAGNOSIS — I10 Essential (primary) hypertension: Secondary | ICD-10-CM

## 2014-09-09 DIAGNOSIS — Z23 Encounter for immunization: Secondary | ICD-10-CM

## 2014-09-09 DIAGNOSIS — E785 Hyperlipidemia, unspecified: Secondary | ICD-10-CM

## 2014-09-09 MED ORDER — PANTOPRAZOLE SODIUM 40 MG PO TBEC: 40.0000 mg | DELAYED_RELEASE_TABLET | Freq: Every day | ORAL | Status: DC

## 2014-09-09 NOTE — Progress Notes (Signed)
Pre visit review using our clinic review tool, if applicable. No additional management support is needed unless otherwise documented below in the visit note. 

## 2014-09-09 NOTE — Progress Notes (Signed)
OFFICE NOTE  09/09/2014  CC:  Chief Complaint  Patient presents with  . Follow-up     HPI: Patient is a 80 y.o. Caucasian male who is here for f/u hyperlipidemia, hypothyroidism, and SOB/DOE. I've been following him for pulm sx's for several months, w/u further outlined in [place] section. Says he feels well except "still coughing up phlegm".  Denies SOB or DOE.  No fever.  Energy level good, raking leaves in yard a lot.  No PND is felt.  Denies typical GERD sx's.  Not taking cough med.   Cough/sputum (clear to light yellow, not thick) not occuring more at any certain time of day.  BP checks at home always well within normal limits.  Pertinent PMH:  Past Medical History  Diagnosis Date  . HTN (hypertension)   . Hypothyroidism   . Erectile dysfunction   . Adenomatous colon polyp 2012    Bx 2012 showed tubular adenoma but no HG dysplasia  . Hearing loss of both ears     +hearing aids  . Increased prostate specific antigen (PSA) velocity 11/11/2011    Biopsy negative 03/11/12.  Followed by Dr. Alinda Money (mild rise in PSA 2015, but Dr. Alinda Money and pt agreed to continue with annual PSA/DRE, no invasive procedure).  . Pleural effusion associated with pulmonary infection 04/2014    PULM: benign parapneumonic effusion; resolved 05/2014  . Pulmonary nodule 2015    with subcarinal adenopathy; stable on serial CT scans in 2015, but plan is to get PET scan around 11/18/14.   Past Surgical History  Procedure Laterality Date  . Knee surgery      2009, laperoscopic  . Umbilical hernia repair  1980  . Tonsillectomy  1970  . Cardiovascular stress test  07/06/14    NORMAL Myocardial perfusion imaging, normal EF, normal LV function, no wall motion abnormality    MEDS:  Not taking prednisone listed below. Outpatient Prescriptions Prior to Visit  Medication Sig Dispense Refill  . aspirin EC 81 MG tablet Take 81 mg by mouth daily.     Marland Kitchen atorvastatin (LIPITOR) 10 MG tablet TAKE 1 TABLET BY MOUTH DAILY 30  tablet 3  . calcium carbonate (CALCIUM 600) 600 MG TABS Take 600 mg by mouth daily.     Marland Kitchen ibuprofen (ADVIL,MOTRIN) 800 MG tablet Take 800 mg by mouth every 8 (eight) hours as needed for mild pain.    Marland Kitchen levothyroxine (SYNTHROID, LEVOTHROID) 125 MCG tablet TAKE 1 TABLET BY MOUTH DAILY 90 tablet 0  . loratadine (CLARITIN) 10 MG tablet Take 10 mg by mouth daily.      . Multiple Vitamins-Minerals (CENTRUM SILVER PO) Take 1 tablet by mouth daily.      . niacin 500 MG tablet Take 500 mg by mouth daily with breakfast.     . Omega-3 Fatty Acids (FISH OIL) 1000 MG CAPS Take 1 capsule by mouth daily.     . Psyllium (METAMUCIL PO) Take 1 scoop by mouth daily.     . clarithromycin (BIAXIN) 500 MG tablet 1 tab po bid with food x 10 days 20 tablet 0  . predniSONE (DELTASONE) 20 MG tablet 2 tabs po qd x 5d, then 1 tab po qd x 5d 15 tablet 0   No facility-administered medications prior to visit.    PE: Blood pressure 123/73, pulse 75, temperature 98.3 F (36.8 C), temperature source Temporal, resp. rate 16, height 5\' 5"  (1.651 m), weight 169 lb (76.658 kg), SpO2 97 %. Gen: Alert, well appearing.  Patient is oriented to person, place, time, and situation. QQV:ZDGL: no injection, icteris, swelling, or exudate.  EOMI, PERRLA. Mouth: lips without lesion/swelling.  Oral mucosa pink and moist. Oropharynx without erythema, exudate, or swelling.  CV: RRR, no m/r/g.   LUNGS: CTA bilat, nonlabored resps, good aeration in all lung fields.   Lab Results  Component Value Date   CHOL 116 03/03/2014   HDL 33.10* 03/03/2014   LDLCALC 71 03/03/2014   TRIG 61.0 03/03/2014   CHOLHDL 4 03/03/2014     Chemistry      Component Value Date/Time   NA 141 08/10/2014 0819   K 4.3 08/10/2014 0819   CL 108 08/10/2014 0819   CO2 26 08/10/2014 0819   BUN 15 08/10/2014 0819   CREATININE 1.0 08/10/2014 0819      Component Value Date/Time   CALCIUM 8.8 08/10/2014 0819   ALKPHOS 60 06/27/2014 0957   AST 21 06/27/2014 0957    ALT 15 06/27/2014 0957   BILITOT 0.9 06/27/2014 0957     Lab Results  Component Value Date   WBC 6.4 06/27/2014   HGB 15.9 06/27/2014   HCT 48.1 06/27/2014   MCV 95.3 06/27/2014   PLT 210.0 06/27/2014   Lab Results  Component Value Date   TSH 0.42 03/03/2014    IMPRESSION AND PLAN:  1) Hyperlipidemia; The current medical regimen is effective;  continue present plan and medications. Next FLP after 02/2015.  2) Hypothyroidism: The current medical regimen is effective;  continue present plan and medications. Next TSH after 02/2015.  3) HTN; stable OFF meds.  Continue periodic home monitoring.  4) Cough; hx of pulm abnormalities--question of whether cough related to pulm findings or not. I think he may be having a manifestating of "silent" GER/LPR.  Will do trial of pantoprazole 40mg  qd. Flu vaccine IM today. Keep plan for PET scan to f/u lung abnormalities seen on CT chest 08/2014: we'll do this after I see him again 11/2014.  An After Visit Summary was printed and given to the patient.  FOLLOW UP: 43mo

## 2014-09-12 ENCOUNTER — Telehealth: Payer: Self-pay | Admitting: Family Medicine

## 2014-09-12 NOTE — Telephone Encounter (Signed)
emmi emailed °

## 2014-10-17 ENCOUNTER — Ambulatory Visit (HOSPITAL_BASED_OUTPATIENT_CLINIC_OR_DEPARTMENT_OTHER)
Admission: RE | Admit: 2014-10-17 | Discharge: 2014-10-17 | Disposition: A | Discharge status: Home / Self Care | Payer: Federal, State, Local not specified - PPO | Source: Ambulatory Visit | Attending: Family Medicine | Admitting: Family Medicine

## 2014-10-17 ENCOUNTER — Encounter: Payer: Self-pay | Admitting: Family Medicine

## 2014-10-17 ENCOUNTER — Ambulatory Visit (INDEPENDENT_AMBULATORY_CARE_PROVIDER_SITE_OTHER): Payer: Federal, State, Local not specified - PPO | Admitting: Family Medicine

## 2014-10-17 VITALS — BP 120/70 | HR 70 | Temp 98.5°F | Resp 16 | Ht 65.0 in | Wt 175.0 lb

## 2014-10-17 DIAGNOSIS — R05 Cough: Secondary | ICD-10-CM | POA: Diagnosis present

## 2014-10-17 DIAGNOSIS — R531 Weakness: Secondary | ICD-10-CM | POA: Insufficient documentation

## 2014-10-17 DIAGNOSIS — R599 Enlarged lymph nodes, unspecified: Secondary | ICD-10-CM | POA: Diagnosis not present

## 2014-10-17 DIAGNOSIS — R059 Cough, unspecified: Secondary | ICD-10-CM

## 2014-10-17 DIAGNOSIS — R590 Localized enlarged lymph nodes: Secondary | ICD-10-CM

## 2014-10-17 LAB — CBC WITH DIFFERENTIAL/PLATELET
BASOS ABS: 0 10*3/uL (ref 0.0–0.1)
Basophils Relative: 0.7 % (ref 0.0–3.0)
EOS ABS: 0.3 10*3/uL (ref 0.0–0.7)
Eosinophils Relative: 5.9 % — ABNORMAL HIGH (ref 0.0–5.0)
HEMATOCRIT: 46.3 % (ref 39.0–52.0)
HEMOGLOBIN: 15.5 g/dL (ref 13.0–17.0)
Lymphocytes Relative: 29.7 % (ref 12.0–46.0)
Lymphs Abs: 1.5 10*3/uL (ref 0.7–4.0)
MCHC: 33.4 g/dL (ref 30.0–36.0)
MCV: 95.6 fl (ref 78.0–100.0)
MONOS PCT: 8.4 % (ref 3.0–12.0)
Monocytes Absolute: 0.4 10*3/uL (ref 0.1–1.0)
Neutro Abs: 2.9 10*3/uL (ref 1.4–7.7)
Neutrophils Relative %: 55.3 % (ref 43.0–77.0)
PLATELETS: 199 10*3/uL (ref 150.0–400.0)
RBC: 4.85 Mil/uL (ref 4.22–5.81)
RDW: 14.2 % (ref 11.5–15.5)
WBC: 5.2 10*3/uL (ref 4.0–10.5)

## 2014-10-17 LAB — COMPREHENSIVE METABOLIC PANEL
ALBUMIN: 3.7 g/dL (ref 3.5–5.2)
ALT: 14 U/L (ref 0–53)
AST: 19 U/L (ref 0–37)
Alkaline Phosphatase: 59 U/L (ref 39–117)
BUN: 17 mg/dL (ref 6–23)
CHLORIDE: 109 meq/L (ref 96–112)
CO2: 25 mEq/L (ref 19–32)
Calcium: 8.7 mg/dL (ref 8.4–10.5)
Creatinine, Ser: 1 mg/dL (ref 0.4–1.5)
GFR: 77.63 mL/min (ref >60.00)
Glucose, Bld: 98 mg/dL (ref 70–99)
POTASSIUM: 3.8 meq/L (ref 3.5–5.1)
SODIUM: 140 meq/L (ref 135–145)
TOTAL PROTEIN: 6.2 g/dL (ref 6.0–8.3)
Total Bilirubin: 0.7 mg/dL (ref 0.2–1.2)

## 2014-10-17 LAB — SEDIMENTATION RATE: Sed Rate: 8 mm/hr (ref 0–22)

## 2014-10-17 LAB — C-REACTIVE PROTEIN: CRP: <0.5 mg/dL (ref 0.5–20.0)

## 2014-10-17 MED ORDER — HYDROCODONE-HOMATROPINE 5-1.5 MG/5ML PO SYRP: ORAL_SOLUTION | ORAL | Status: DC

## 2014-10-17 NOTE — Progress Notes (Signed)
OFFICE NOTE  10/17/2014  CC:  Chief Complaint  Patient presents with  . URI    x Thursday   . Cough   HPI: Patient is a 80 y.o. Caucasian male who is here for cough.   Onset 4-5 days ago: ST, lots more coughing and mucous production with lots of chest tightness.  No fevers, no nasal congestion or runny nose.  Mild HA intermittently.  No achiness in remainder of his body.  No n/v/d.  No known sick contacts.  Tussin OTC no help.  Pertinent PMH:  Past medical, surgical, social, and family history reviewed and no changes are noted since last office visit.  MEDS:  Outpatient Prescriptions Prior to Visit  Medication Sig Dispense Refill  . aspirin EC 81 MG tablet Take 81 mg by mouth daily.     Marland Kitchen atorvastatin (LIPITOR) 10 MG tablet TAKE 1 TABLET BY MOUTH DAILY 30 tablet 3  . calcium carbonate (CALCIUM 600) 600 MG TABS Take 600 mg by mouth daily.     Marland Kitchen ibuprofen (ADVIL,MOTRIN) 800 MG tablet Take 800 mg by mouth every 8 (eight) hours as needed for mild pain.    Marland Kitchen levothyroxine (SYNTHROID, LEVOTHROID) 125 MCG tablet TAKE 1 TABLET BY MOUTH DAILY 90 tablet 0  . loratadine (CLARITIN) 10 MG tablet Take 10 mg by mouth daily.      . Multiple Vitamins-Minerals (CENTRUM SILVER PO) Take 1 tablet by mouth daily.      . niacin 500 MG tablet Take 500 mg by mouth daily with breakfast.     . Omega-3 Fatty Acids (FISH OIL) 1000 MG CAPS Take 1 capsule by mouth daily.     . pantoprazole (PROTONIX) 40 MG tablet Take 1 tablet (40 mg total) by mouth daily. 30 tablet 3  . Psyllium (METAMUCIL PO) Take 1 scoop by mouth daily.      No facility-administered medications prior to visit.    PE: Blood pressure 120/70, pulse 70, temperature 98.5 F (36.9 C), temperature source Temporal, resp. rate 16, height _0  (1.651 m), weight 175 lb (79.379 kg), SpO2 95 %. Gen: Alert, well appearing.  Patient is oriented to person, place, time, and situation. ENT: Ears: EACs clear, normal epithelium.  TMs with good light reflex  and landmarks bilaterally.  Eyes: no injection, icteris, swelling, or exudate.  EOMI, PERRLA. Nose: no drainage or turbinate edema/swelling.  No injection or focal lesion.  Mouth: lips without lesion/swelling.  Oral mucosa pink and moist.  Dentition intact and without obvious caries or gingival swelling.  Oropharynx without erythema, exudate, or swelling.  Neck - No masses or thyromegaly or limitation in range of motion CV: RRR, no m/r/g.   LUNGS: CTA bilat (just the slightest hint of soft, early insp crackles in both bases, R a little more than L), nonlabored resps, good aeration in all lung fields. EXT: no clubbing, cyanosis, or edema.    IMPRESSION AND PLAN:  Prolonged/chronic productive cough, with acute worsening 4 d/a. Pt with relatively recent CT chest showing hilar adenopathy. Looks good today/exam benign. Will check CXR today, CBC, CMET, ESR, CRP, ACE. Hycodan syrup, 1-2 tsp qhs prn cough, 118 ml.  An After Visit Summary was printed and given to the patient.  FOLLOW UP: prn

## 2014-10-17 NOTE — Progress Notes (Signed)
Pre visit review using our clinic review tool, if applicable. No additional management support is needed unless otherwise documented below in the visit note. 

## 2014-10-18 ENCOUNTER — Other Ambulatory Visit: Payer: Self-pay | Admitting: Family Medicine

## 2014-10-18 LAB — ANGIOTENSIN CONVERTING ENZYME: Angiotensin-Converting Enzyme: 32 U/L (ref 8–52)

## 2014-10-18 MED ORDER — PREDNISONE 20 MG PO TABS: 20.0000 mg | ORAL_TABLET | Freq: Every day | ORAL | Status: DC

## 2014-10-21 ENCOUNTER — Other Ambulatory Visit: Payer: Self-pay | Admitting: Family Medicine

## 2014-11-09 ENCOUNTER — Encounter: Payer: Self-pay | Admitting: Family Medicine

## 2014-11-09 ENCOUNTER — Ambulatory Visit (INDEPENDENT_AMBULATORY_CARE_PROVIDER_SITE_OTHER): Payer: Federal, State, Local not specified - PPO | Admitting: Family Medicine

## 2014-11-09 VITALS — BP 132/71 | HR 74 | Temp 98.2°F | Resp 16 | Ht 65.0 in | Wt 175.0 lb

## 2014-11-09 DIAGNOSIS — R05 Cough: Secondary | ICD-10-CM

## 2014-11-09 DIAGNOSIS — E785 Hyperlipidemia, unspecified: Secondary | ICD-10-CM

## 2014-11-09 DIAGNOSIS — E039 Hypothyroidism, unspecified: Secondary | ICD-10-CM

## 2014-11-09 DIAGNOSIS — R053 Chronic cough: Secondary | ICD-10-CM

## 2014-11-09 DIAGNOSIS — R911 Solitary pulmonary nodule: Secondary | ICD-10-CM

## 2014-11-09 DIAGNOSIS — R590 Localized enlarged lymph nodes: Secondary | ICD-10-CM

## 2014-11-09 DIAGNOSIS — R599 Enlarged lymph nodes, unspecified: Secondary | ICD-10-CM

## 2014-11-09 NOTE — Progress Notes (Signed)
OFFICE NOTE  11/09/2014  CC:  Chief Complaint  Patient presents with  . Follow-up   HPI: Patient is a 81 y.o.  male who is here for 2 mo f/u hyperlipidemia, hypothyroidism, and also 3 wk f/u exacerbation of chronic productive cough that we have been investigating.   Feeling well, compliant with meds.  Still with baseline productive cough--remarks about phlegm constantly in throat.  No wheezing, no SOB, no CP.   We reviewed all recent labs and imaging over the last few months that have been done in investigation of his cough problem.  ROS: +mild fatigue.  No HA's, no dizziness, no cognitive dysfunction.  No focal weakness, no bone pain, no n/v/d or abd pain.  No abnormal wt loss.    Pertinent PMH:  Past medical, surgical, social, and family history reviewed and no changes are noted since last office visit.  MEDS:  Outpatient Prescriptions Prior to Visit  Medication Sig Dispense Refill  . aspirin EC 81 MG tablet Take 81 mg by mouth daily.     Marland Kitchen atorvastatin (LIPITOR) 10 MG tablet TAKE 1 TABLET BY MOUTH EVERY DAY 90 tablet 0  . calcium carbonate (CALCIUM 600) 600 MG TABS Take 600 mg by mouth daily.     Marland Kitchen ibuprofen (ADVIL,MOTRIN) 800 MG tablet Take 800 mg by mouth every 8 (eight) hours as needed for mild pain.    Marland Kitchen levothyroxine (SYNTHROID, LEVOTHROID) 125 MCG tablet TAKE 1 TABLET BY MOUTH DAILY 90 tablet 0  . loratadine (CLARITIN) 10 MG tablet Take 10 mg by mouth daily.      . Multiple Vitamins-Minerals (CENTRUM SILVER PO) Take 1 tablet by mouth daily.      . niacin 500 MG tablet Take 500 mg by mouth daily with breakfast.     . Omega-3 Fatty Acids (FISH OIL) 1000 MG CAPS Take 1 capsule by mouth daily.     . pantoprazole (PROTONIX) 40 MG tablet Take 1 tablet (40 mg total) by mouth daily. 30 tablet 3  . Psyllium (METAMUCIL PO) Take 1 scoop by mouth daily.     Marland Kitchen HYDROcodone-homatropine (HYCODAN) 5-1.5 MG/5ML syrup 1-2 tsp po qhs prn cough (Patient not taking: Reported on 11/09/2014) 118 mL 0   . predniSONE (DELTASONE) 20 MG tablet Take 1 tablet (20 mg total) by mouth daily with breakfast. (Patient not taking: Reported on 11/09/2014) 11 tablet 0   No facility-administered medications prior to visit.    PE: Blood pressure 132/71, pulse 74, temperature 98.2 F (36.8 C), temperature source Temporal, resp. rate 16, height 5\' 5"  (1.651 m), weight 175 lb (79.379 kg), SpO2 98 %. Wt is stable. Gen: Alert, well appearing.  Patient is oriented to person, place, time, and situation. AFFECT: pleasant, lucid thought and speech.   LAB: none today RECENT: Lab Results  Component Value Date   TSH 0.42 03/03/2014   Lab Results  Component Value Date   CHOL 116 03/03/2014   HDL 33.10* 03/03/2014   LDLCALC 71 03/03/2014   TRIG 61.0 03/03/2014   CHOLHDL 4 03/03/2014     Chemistry      Component Value Date/Time   NA 140 10/17/2014 1527   K 3.8 10/17/2014 1527   CL 109 10/17/2014 1527   CO2 25 10/17/2014 1527   BUN 17 10/17/2014 1527   CREATININE 1.0 10/17/2014 1527      Component Value Date/Time   CALCIUM 8.7 10/17/2014 1527   ALKPHOS 59 10/17/2014 1527   AST 19 10/17/2014 1527   ALT  14 10/17/2014 1527   BILITOT 0.7 10/17/2014 1527     Lab Results  Component Value Date   WBC 5.2 10/17/2014   HGB 15.5 10/17/2014   HCT 46.3 10/17/2014   MCV 95.6 10/17/2014   PLT 199.0 10/17/2014     IMPRESSION AND PLAN:  1) Chronic cough, unclear etiology. Abnormal chest CT: right lung "density" and subcarinal adenopathy on CT 07/2011 and 08/2011, radiologist recommended PET scan to f/u these abnormalities in 48mo and we are at that time frame now so I have ordered this.  He has had a Cr less than a month ago and it was fine.  2) Hypothyroidism: The current medical regimen is effective;  continue present plan and medications. TSH at next f/u in 42mo.  3) Hyperlipidemia; The current medical regimen is effective;  continue present plan and medications. FLP at next f/u in 4 mo.  Recent liver  panel normal.  4) GERD: he really does not think this is contributing to the feeling of the phlegm/mucous in back of throat, plus he is compliant with a daily PPI and GER diet.  An After Visit Summary was printed and given to the patient.   FOLLOW UP: 4 mo

## 2014-11-09 NOTE — Progress Notes (Signed)
Pre visit review using our clinic review tool, if applicable. No additional management support is needed unless otherwise documented below in the visit note. 

## 2014-11-17 ENCOUNTER — Ambulatory Visit (HOSPITAL_COMMUNITY): Payer: Federal, State, Local not specified - PPO

## 2014-11-20 ENCOUNTER — Other Ambulatory Visit: Payer: Self-pay | Admitting: Family Medicine

## 2014-12-30 IMAGING — CR DG CHEST 2V
2 series · 2 of 2 positions shown · non-contrast
Comparison: 08/15/2014

CLINICAL DATA: Productive cough, weakness, mediastinal adenopathy
on CT scan

EXAM:
CHEST  2 VIEW

[w chest pa]
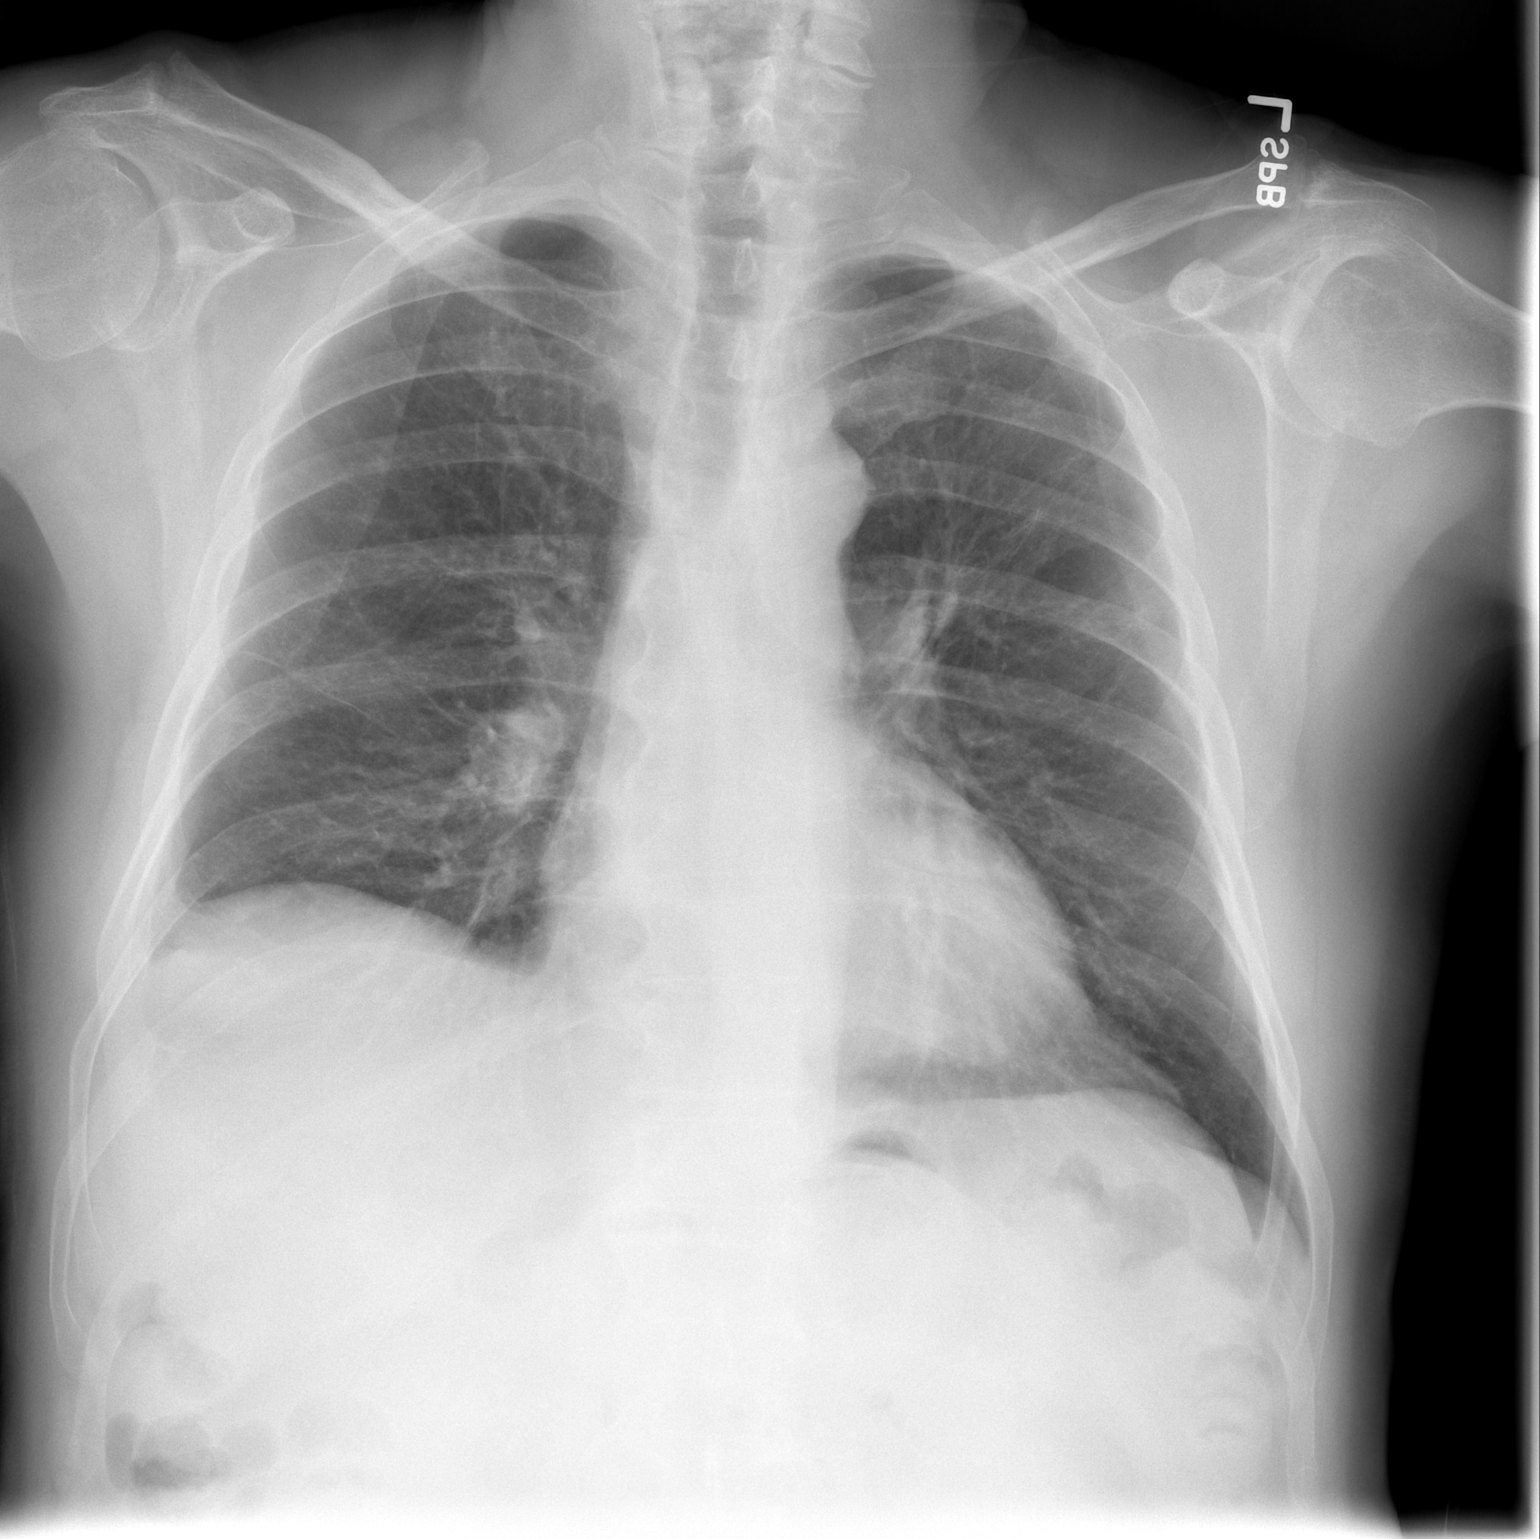

[w chest lat]
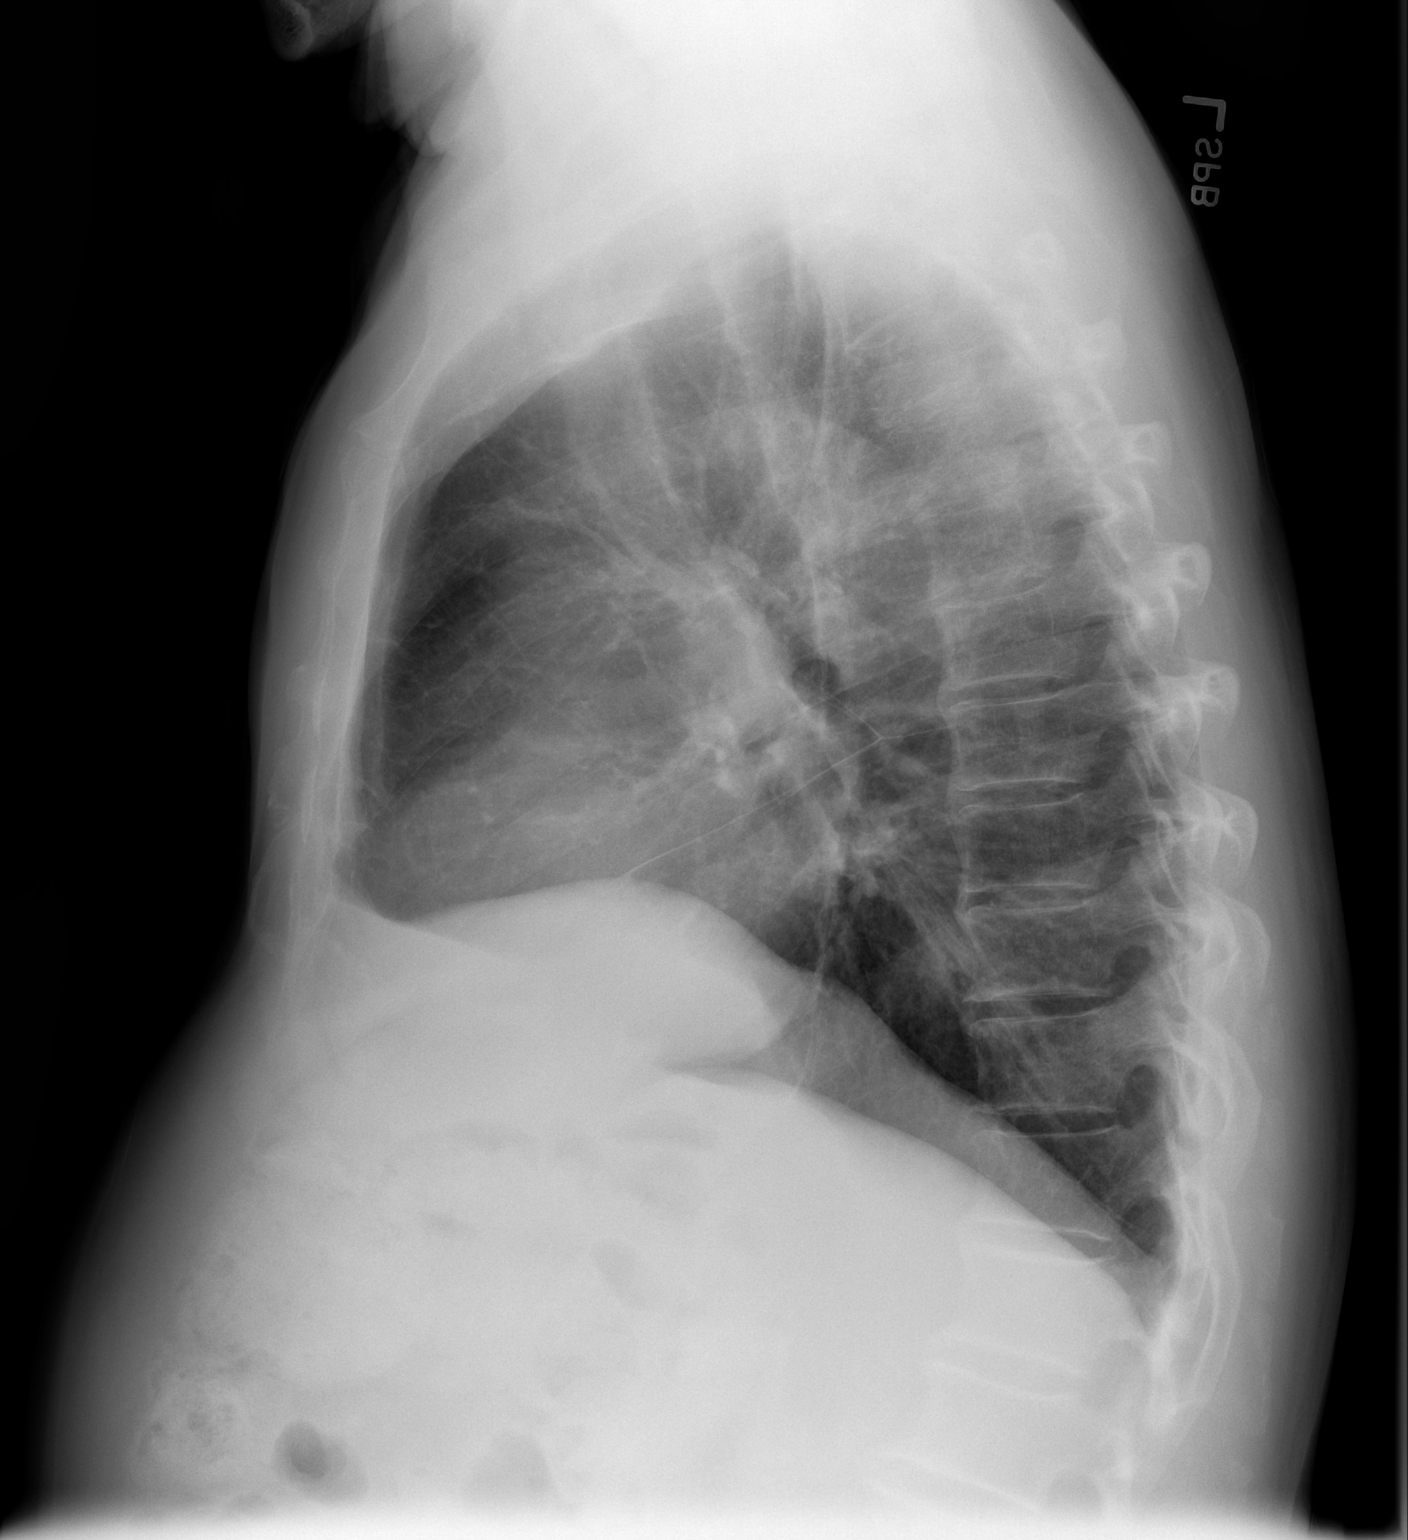

[2 of 2 positions shown; findings below may reference images not displayed]

FINDINGS: Cardiomediastinal silhouette is stable. No acute infiltrate or
pulmonary edema. Mild degenerative changes thoracic spine.
Persistent nodular lesion in right lateral costophrenic angle
measures about 1.2 cm. This lesion was better characterized on CT
scan 08/15/2014. Follow-up examination should be continued as
recommended on CT scan.
IMPRESSION: No acute infiltrate or pulmonary edema. Mild degenerative changes
thoracic spine. Persistent nodular lesion in right lateral
costophrenic angle measures about 1.2 cm. This lesion was better
characterized on CT scan 08/15/2014. Follow-up examination should be
continued as recommended on CT scan.

## 2015-02-27 ENCOUNTER — Other Ambulatory Visit: Payer: Self-pay | Admitting: Family Medicine

## 2015-03-10 ENCOUNTER — Ambulatory Visit (INDEPENDENT_AMBULATORY_CARE_PROVIDER_SITE_OTHER): Payer: Federal, State, Local not specified - PPO | Admitting: Family Medicine

## 2015-03-10 ENCOUNTER — Encounter: Payer: Self-pay | Admitting: Family Medicine

## 2015-03-10 VITALS — BP 136/94 | HR 70 | Temp 97.7°F | Resp 16 | Wt 169.0 lb

## 2015-03-10 DIAGNOSIS — Z8679 Personal history of other diseases of the circulatory system: Secondary | ICD-10-CM

## 2015-03-10 DIAGNOSIS — R972 Elevated prostate specific antigen [PSA]: Secondary | ICD-10-CM | POA: Diagnosis not present

## 2015-03-10 DIAGNOSIS — R05 Cough: Secondary | ICD-10-CM | POA: Diagnosis not present

## 2015-03-10 DIAGNOSIS — E039 Hypothyroidism, unspecified: Secondary | ICD-10-CM

## 2015-03-10 DIAGNOSIS — R053 Chronic cough: Secondary | ICD-10-CM

## 2015-03-10 DIAGNOSIS — E785 Hyperlipidemia, unspecified: Secondary | ICD-10-CM

## 2015-03-10 LAB — LIPID PANEL
CHOLESTEROL: 127 mg/dL (ref 0–200)
HDL: 44.6 mg/dL (ref >39.00)
LDL Cholesterol: 67 mg/dL (ref 0–99)
NonHDL: 82.4
Total CHOL/HDL Ratio: 3
Triglycerides: 78 mg/dL (ref 0.0–149.0)
VLDL: 15.6 mg/dL (ref 0.0–40.0)

## 2015-03-10 LAB — TSH: TSH: 1.72 u[IU]/mL (ref 0.35–4.50)

## 2015-03-10 NOTE — Progress Notes (Signed)
OFFICE NOTE  03/10/2015  CC:  Chief Complaint  Patient presents with  . Follow-up   HPI: Patient is a 81 y.o. Caucasian male who is here for 6 mo f/u hyperlipidemia, hypothyroidism, and SOB/DOE. He is happy to report recent urology f/u for hx of elevated PSA's showed PSA went down to 4.04 on 02/17/15, plan is to continue with annual DRE/PSA checks now.  Started pantoprazole last visit b/c I felt like LPR was contributing to his cough.  He has NO COUGH OR SOB lately. Didn't get PET scan b/c it was going to cost him 2400 $ out of pocket.  "I feel better than I ever have".  Home bp monitoring: "all normal".  On no bp meds at this time.  Pertinent PMH:  Past medical, surgical, social, and family history reviewed and no changes are noted since last office visit.  MEDS:  Outpatient Prescriptions Prior to Visit  Medication Sig Dispense Refill  . aspirin EC 81 MG tablet Take 81 mg by mouth daily.     Marland Kitchen atorvastatin (LIPITOR) 10 MG tablet TAKE 1 TABLET BY MOUTH EVERY DAY 90 tablet 0  . calcium carbonate (CALCIUM 600) 600 MG TABS Take 600 mg by mouth daily.     Marland Kitchen levothyroxine (SYNTHROID, LEVOTHROID) 125 MCG tablet TAKE 1 TABLET BY MOUTH DAILY 90 tablet 0  . loratadine (CLARITIN) 10 MG tablet Take 10 mg by mouth daily.      . Multiple Vitamins-Minerals (CENTRUM SILVER PO) Take 1 tablet by mouth daily.      . niacin 500 MG tablet Take 500 mg by mouth daily with breakfast.     . Omega-3 Fatty Acids (FISH OIL) 1000 MG CAPS Take 1 capsule by mouth daily.     . pantoprazole (PROTONIX) 40 MG tablet Take 1 tablet (40 mg total) by mouth daily. 30 tablet 3  . ibuprofen (ADVIL,MOTRIN) 800 MG tablet Take 800 mg by mouth every 8 (eight) hours as needed for mild pain.    Marland Kitchen Psyllium (METAMUCIL PO) Take 1 scoop by mouth daily.     Marland Kitchen atorvastatin (LIPITOR) 10 MG tablet TAKE 1 TABLET BY MOUTH EVERY DAY (Patient not taking: Reported on 03/10/2015) 90 tablet 0  . HYDROcodone-homatropine (HYCODAN) 5-1.5 MG/5ML  syrup 1-2 tsp po qhs prn cough (Patient not taking: Reported on 11/09/2014) 118 mL 0   No facility-administered medications prior to visit.    PE: Blood pressure 136/94, pulse 70, temperature 97.7 F (36.5 C), temperature source Temporal, resp. rate 16, weight 169 lb (76.658 kg), SpO2 98 %. Gen: Alert, well appearing.  Patient is oriented to person, place, time, and situation. CV: RRR, no m/r/g.   LUNGS: CTA bilat, nonlabored resps, good aeration in all lung fields. EXT: no clubbing, cyanosis, or edema.     IMPRESSION AND PLAN:  1) Hyperlipidemia; tolerating statin. Recheck FLP today.  2) Hx of HTN: bp good off meds.  Continue periodic home monitoring.  3) Hx of chronic/recurrent cough: resolved. Continue PPI qd.  Hx of mild abnormalities on chest CT 08/2014. Pleural nodule (stable), subcarinal adenopathy, stable density in right lateral costophrenic angle: pt deferred the PET scan that we had planned to do to follow this up (due to cost issues).  This is fine considering he is now totally asymptomatic.  These findings were likely reactive/inflammatory. If sx's return will plan f/u CT w/out contrast.  4) Hypothyroidism: recheck TSH today.  5) Hx of elevated PSA: urologist and pt decided on watchful waiting/monitoring (no bx)  and this has worked out: PSA dropping.  Keep approp annual f/u with urol (Dr. Alinda Money).  An After Visit Summary was printed and given to the patient.  FOLLOW UP: 6 mo

## 2015-03-10 NOTE — Progress Notes (Signed)
Pre visit review using our clinic review tool, if applicable. No additional management support is needed unless otherwise documented below in the visit note. 

## 2015-03-21 ENCOUNTER — Other Ambulatory Visit: Payer: Self-pay | Admitting: Family Medicine

## 2015-05-27 ENCOUNTER — Other Ambulatory Visit: Payer: Self-pay | Admitting: Family Medicine

## 2015-05-29 NOTE — Telephone Encounter (Signed)
RF request for levothyroxine.  LOV: 03/10/15 Next ov: 09/11/15 Last written: 02/27/15 #90 w/ 0RF TSH on 03/10/15 at 1.72

## 2015-06-03 ENCOUNTER — Other Ambulatory Visit: Payer: Self-pay | Admitting: Family Medicine

## 2015-09-11 ENCOUNTER — Ambulatory Visit (INDEPENDENT_AMBULATORY_CARE_PROVIDER_SITE_OTHER): Payer: Federal, State, Local not specified - PPO | Admitting: Family Medicine

## 2015-09-11 ENCOUNTER — Ambulatory Visit (HOSPITAL_BASED_OUTPATIENT_CLINIC_OR_DEPARTMENT_OTHER)
Admission: RE | Admit: 2015-09-11 | Discharge: 2015-09-11 | Disposition: A | Discharge status: Home / Self Care | Payer: Federal, State, Local not specified - PPO | Source: Ambulatory Visit | Attending: Family Medicine | Admitting: Family Medicine

## 2015-09-11 ENCOUNTER — Encounter: Payer: Self-pay | Admitting: Family Medicine

## 2015-09-11 VITALS — BP 121/76 | HR 67 | Temp 97.7°F | Resp 16 | Ht 65.0 in | Wt 173.0 lb

## 2015-09-11 DIAGNOSIS — Z23 Encounter for immunization: Secondary | ICD-10-CM | POA: Diagnosis not present

## 2015-09-11 DIAGNOSIS — R05 Cough: Secondary | ICD-10-CM

## 2015-09-11 DIAGNOSIS — E785 Hyperlipidemia, unspecified: Secondary | ICD-10-CM

## 2015-09-11 DIAGNOSIS — E039 Hypothyroidism, unspecified: Secondary | ICD-10-CM | POA: Diagnosis not present

## 2015-09-11 DIAGNOSIS — I10 Essential (primary) hypertension: Secondary | ICD-10-CM | POA: Diagnosis not present

## 2015-09-11 DIAGNOSIS — K8020 Calculus of gallbladder without cholecystitis without obstruction: Secondary | ICD-10-CM | POA: Diagnosis not present

## 2015-09-11 DIAGNOSIS — I2510 Atherosclerotic heart disease of native coronary artery without angina pectoris: Secondary | ICD-10-CM | POA: Insufficient documentation

## 2015-09-11 DIAGNOSIS — R911 Solitary pulmonary nodule: Secondary | ICD-10-CM

## 2015-09-11 DIAGNOSIS — R053 Chronic cough: Secondary | ICD-10-CM

## 2015-09-11 NOTE — Progress Notes (Signed)
Pre visit review using our clinic review tool, if applicable. No additional management support is needed unless otherwise documented below in the visit note. 

## 2015-09-11 NOTE — Progress Notes (Signed)
OFFICE VISIT  09/11/2015   CC:  Chief Complaint  Patient presents with  . Follow-up    Pt is fasting.      HPI:    Patient is a 81 y.o. Caucasian male who presents for 6 mo f/u hyperlipidemia, hypothyroidism, and SOB/DOE.   He has a known pulm nodule with subcarinal adenopathy that we were unable to do a f/u PET scan on due to cost issues.  Stable cough" three or four times a day".  No hemoptysis. Good appetite. No side effects from cholest med--he is compliant. Compliant with levothyroxine.  ROS: energy level normal, no SOB/DOE, no chest pain   Past Medical History  Diagnosis Date  . HTN (hypertension)   . Hypothyroidism   . Erectile dysfunction   . Adenomatous colon polyp 2012    Bx 2012 showed tubular adenoma but no HG dysplasia  . Hearing loss of both ears     +hearing aids  . Increased prostate specific antigen (PSA) velocity 11/11/2011    Biopsy negative 03/11/12.  Followed by Dr. Alinda Money (mild rise in PSA 2015, but Dr. Alinda Money and pt agreed to continue with annual PSA/DRE, no invasive procedure).  . Pleural effusion associated with pulmonary infection 04/2014    PULM: benign parapneumonic effusion; resolved 05/2014  . Pulmonary nodule 2015    with subcarinal adenopathy; stable on serial CT scans in 2015, but plan is to get PET scan around 11/18/14.    Past Surgical History  Procedure Laterality Date  . Knee surgery      2009, laperoscopic  . Umbilical hernia repair  1980  . Tonsillectomy  1970  . Cardiovascular stress test  07/06/14    NORMAL Myocardial perfusion imaging, normal EF, normal LV function, no wall motion abnormality    Outpatient Prescriptions Prior to Visit  Medication Sig Dispense Refill  . aspirin EC 81 MG tablet Take 81 mg by mouth daily.     Marland Kitchen atorvastatin (LIPITOR) 10 MG tablet TAKE 1 TABLET BY MOUTH EVERY DAY 90 tablet 0  . atorvastatin (LIPITOR) 10 MG tablet TAKE 1 TABLET BY MOUTH EVERY DAY 90 tablet 1  . calcium carbonate (CALCIUM 600) 600 MG  TABS Take 600 mg by mouth daily.     Marland Kitchen ibuprofen (ADVIL,MOTRIN) 800 MG tablet Take 800 mg by mouth every 8 (eight) hours as needed for mild pain.    Marland Kitchen levothyroxine (SYNTHROID, LEVOTHROID) 125 MCG tablet TAKE 1 TABLET BY MOUTH DAILY 90 tablet 3  . loratadine (CLARITIN) 10 MG tablet Take 10 mg by mouth daily.      . Multiple Vitamins-Minerals (CENTRUM SILVER PO) Take 1 tablet by mouth daily.      . niacin 500 MG tablet Take 500 mg by mouth daily with breakfast.     . Omega-3 Fatty Acids (FISH OIL) 1000 MG CAPS Take 1 capsule by mouth daily.     . pantoprazole (PROTONIX) 40 MG tablet Take 1 tablet (40 mg total) by mouth daily. 30 tablet 3  . Psyllium (METAMUCIL PO) Take 1 scoop by mouth daily.      No facility-administered medications prior to visit.    No Known Allergies  ROS As per HPI  PE: Blood pressure 121/76, pulse 67, temperature 97.7 F (36.5 C), temperature source Oral, resp. rate 16, height 5\' 5"  (1.651 m), weight 173 lb (78.472 kg), SpO2 97 %. Gen: Alert, well appearing.  Patient is oriented to person, place, time, and situation. VWP:VXYI: no injection, icteris, swelling, or exudate.  EOMI, PERRLA. Mouth: lips without lesion/swelling.  Oral mucosa pink and moist. Oropharynx without erythema, exudate, or swelling.  Neck - No masses or thyromegaly or limitation in range of motion No supraclavicular nodes. CV: RRR, no m/r/g.   LUNGS: CTA bilat, nonlabored resps, good aeration in all lung fields. EXT: no clubbing, cyanosis, or edema.    LABS:   Lab Results  Component Value Date   TSH 1.72 03/10/2015   Lab Results  Component Value Date   WBC 5.2 10/17/2014   HGB 15.5 10/17/2014   HCT 46.3 10/17/2014   MCV 95.6 10/17/2014   PLT 199.0 10/17/2014   Lab Results  Component Value Date   CREATININE 1.0 10/17/2014   BUN 17 10/17/2014   NA 140 10/17/2014   K 3.8 10/17/2014   CL 109 10/17/2014   CO2 25 10/17/2014   Lab Results  Component Value Date   ALT 14  10/17/2014   AST 19 10/17/2014   ALKPHOS 59 10/17/2014   BILITOT 0.7 10/17/2014   Lab Results  Component Value Date   CHOL 127 03/10/2015   Lab Results  Component Value Date   HDL 44.60 03/10/2015   Lab Results  Component Value Date   LDLCALC 67 03/10/2015   Lab Results  Component Value Date   TRIG 78.0 03/10/2015   Lab Results  Component Value Date   CHOLHDL 3 03/10/2015   Lab Results  Component Value Date   PSA 3.94 11/11/2011   PSA 2.61 12/17/2010    IMPRESSION AND PLAN:  1) Solitary pulm nodule, with subcarinal LAD: pt could not afford the PET scan that was recommended but we'll do the recommended routine f/u CT w/out contrast. His cough is stable, likely upper airway cough syndrome.  Continue current meds.  2) Hypothyroidism; The current medical regimen is effective;  continue present plan and medications. Recheck TSH next f/u 6 mo.  3) Hyperlipidemia: tolerating statin.  Chol good 03/2015.  Plan repeat fasting labs next f/u 6 mo.   Flu vaccine today.  An After Visit Summary was printed and given to the patient.   FOLLOW UP: Return in about 6 months (around 03/10/2016) for routine chronic illness f/u.

## 2015-09-22 ENCOUNTER — Other Ambulatory Visit: Payer: Self-pay | Admitting: Family Medicine

## 2015-11-05 DIAGNOSIS — K8020 Calculus of gallbladder without cholecystitis without obstruction: Secondary | ICD-10-CM

## 2015-11-05 DIAGNOSIS — M84376A Stress fracture, unspecified foot, initial encounter for fracture: Secondary | ICD-10-CM

## 2015-11-05 HISTORY — DX: Calculus of gallbladder without cholecystitis without obstruction: K80.20

## 2015-11-05 HISTORY — DX: Stress fracture, unspecified foot, initial encounter for fracture: M84.376A

## 2015-11-08 ENCOUNTER — Ambulatory Visit (INDEPENDENT_AMBULATORY_CARE_PROVIDER_SITE_OTHER): Payer: Federal, State, Local not specified - PPO | Admitting: Family Medicine

## 2015-11-08 ENCOUNTER — Ambulatory Visit (HOSPITAL_BASED_OUTPATIENT_CLINIC_OR_DEPARTMENT_OTHER)
Admission: RE | Admit: 2015-11-08 | Discharge: 2015-11-08 | Disposition: A | Discharge status: Home / Self Care | Payer: Federal, State, Local not specified - PPO | Source: Ambulatory Visit | Attending: Family Medicine | Admitting: Family Medicine

## 2015-11-08 ENCOUNTER — Encounter: Payer: Self-pay | Admitting: Family Medicine

## 2015-11-08 VITALS — BP 118/74 | HR 86 | Temp 98.2°F | Resp 16 | Ht 65.0 in | Wt 174.5 lb

## 2015-11-08 DIAGNOSIS — J189 Pneumonia, unspecified organism: Secondary | ICD-10-CM | POA: Insufficient documentation

## 2015-11-08 MED ORDER — AZITHROMYCIN 250 MG PO TABS: ORAL_TABLET | ORAL | Status: DC

## 2015-11-08 MED ORDER — CEFTRIAXONE SODIUM 1 G IJ SOLR
1.0000 g | Freq: Once | INTRAMUSCULAR | Status: AC
  Administered 2015-11-08: 1 g via INTRAMUSCULAR

## 2015-11-08 MED ORDER — HYDROCODONE-HOMATROPINE 5-1.5 MG/5ML PO SYRP: ORAL_SOLUTION | ORAL | Status: DC

## 2015-11-08 NOTE — Progress Notes (Signed)
OFFICE VISIT  11/08/2015   CC:  Chief Complaint  Patient presents with  . Cough    x 2-3 days     HPI:    Patient is a 82 y.o. Caucasian male who presents for onset of cough 2 days ago, thick and yellow phlegm.  Has ST but no nasal sx's.  +HA.  Chest hurting a lot with all the coughing.  Lots of pain in back initially then this went away.  Generalized weakness noted.  No signif SOB, wheezing, or fever. Tried OTC cough syrup: helped a little at first.  No known sick contacts recently.  He got flu vaccine this season.  Past Medical History  Diagnosis Date  . HTN (hypertension)   . Hypothyroidism   . Erectile dysfunction   . Adenomatous colon polyp 2012    Bx 2012 showed tubular adenoma but no HG dysplasia  . Hearing loss of both ears     +hearing aids  . Increased prostate specific antigen (PSA) velocity 11/11/2011    Biopsy negative 03/11/12.  Followed by Dr. Alinda Money (mild rise in PSA 2015, but Dr. Alinda Money and pt agreed to continue with annual PSA/DRE, no invasive procedure).  . Pleural effusion associated with pulmonary infection 04/2014    PULM: benign parapneumonic effusion; resolved 05/2014  . Pulmonary nodule 2015    with subcarinal adenopathy; stable on serial CT scans in 2015, but plan is to get PET scan around 11/18/14.    Past Surgical History  Procedure Laterality Date  . Knee surgery      2009, laperoscopic  . Umbilical hernia repair  1980  . Tonsillectomy  1970  . Cardiovascular stress test  07/06/14    NORMAL Myocardial perfusion imaging, normal EF, normal LV function, no wall motion abnormality    Outpatient Prescriptions Prior to Visit  Medication Sig Dispense Refill  . aspirin EC 81 MG tablet Take 81 mg by mouth daily.     Marland Kitchen atorvastatin (LIPITOR) 10 MG tablet TAKE 1 TABLET BY MOUTH EVERY DAY 90 tablet 1  . calcium carbonate (CALCIUM 600) 600 MG TABS Take 600 mg by mouth daily.     Marland Kitchen ibuprofen (ADVIL,MOTRIN) 800 MG tablet Take 800 mg by mouth every 8 (eight) hours  as needed for mild pain.    Marland Kitchen levothyroxine (SYNTHROID, LEVOTHROID) 125 MCG tablet TAKE 1 TABLET BY MOUTH DAILY 90 tablet 3  . loratadine (CLARITIN) 10 MG tablet Take 10 mg by mouth daily.      . Multiple Vitamins-Minerals (CENTRUM SILVER PO) Take 1 tablet by mouth daily.      . niacin 500 MG tablet Take 500 mg by mouth daily with breakfast.     . Omega-3 Fatty Acids (FISH OIL) 1000 MG CAPS Take 1 capsule by mouth daily.     . pantoprazole (PROTONIX) 40 MG tablet Take 1 tablet (40 mg total) by mouth daily. 30 tablet 3  . Psyllium (METAMUCIL PO) Take 1 scoop by mouth daily.     Marland Kitchen atorvastatin (LIPITOR) 10 MG tablet TAKE 1 TABLET BY MOUTH EVERY DAY (Patient not taking: Reported on 11/08/2015) 90 tablet 0   No facility-administered medications prior to visit.    No Known Allergies  ROS As per HPI  PE: Blood pressure 118/74, pulse 86, temperature 98.2 F (36.8 C), temperature source Oral, resp. rate 16, height 5\' 5"  (1.651 m), weight 174 lb 8 oz (79.153 kg), SpO2 95 %. VS: noted--normal. Gen: alert, NAD, NONTOXIC APPEARING. HEENT: eyes without injection, drainage,  or swelling.   Nose: Clear rhinorrhea, with some dried, crusty exudate adherent to mildly injected mucosa.  No purulent d/c.  No paranasal sinus TTP.  No facial swelling.  Throat and mouth without focal lesion.  No pharyngial swelling, erythema, or exudate.   Neck: supple, no LAD.   LUNGS: CTA bilat except soft end-insp crackles noted in far left base, nonlabored resps.   CV: RRR, no m/r/g. EXT: no c/c/e SKIN: no rash  LABS:  None today  IMPRESSION AND PLAN:  Pneumonia suspected: 1g rocephin IM in office today. Z-pack rx'd.  Continue robitussin DM otc, add hycodan syrup hs 1-2 tsp, 161ml. CXR ordered. Push fluids, rest. Signs/symptoms to call or return for were reviewed and pt expressed understanding.  An After Visit Summary was printed and given to the patient.  FOLLOW UP: Return for 5-6 d f/u pneumonia.

## 2015-11-08 NOTE — Addendum Note (Signed)
Addended by: Ralph Dowdy on: 11/08/2015 09:58 AM   Modules accepted: Orders

## 2015-11-08 NOTE — Progress Notes (Signed)
Pre visit review using our clinic review tool, if applicable. No additional management support is needed unless otherwise documented below in the visit note. 

## 2015-11-08 NOTE — Patient Instructions (Signed)
Take generic otc robitussin DM as directed on packaging. 

## 2015-11-13 ENCOUNTER — Ambulatory Visit (INDEPENDENT_AMBULATORY_CARE_PROVIDER_SITE_OTHER): Payer: Federal, State, Local not specified - PPO | Admitting: Family Medicine

## 2015-11-13 ENCOUNTER — Ambulatory Visit: Payer: Federal, State, Local not specified - PPO | Admitting: Family Medicine

## 2015-11-13 ENCOUNTER — Encounter: Payer: Self-pay | Admitting: Family Medicine

## 2015-11-13 VITALS — BP 126/75 | HR 60 | Temp 97.8°F | Resp 16 | Ht 65.0 in | Wt 178.2 lb

## 2015-11-13 DIAGNOSIS — J180 Bronchopneumonia, unspecified organism: Secondary | ICD-10-CM

## 2015-11-13 MED ORDER — HYDROCODONE-HOMATROPINE 5-1.5 MG/5ML PO SYRP: ORAL_SOLUTION | ORAL | Status: DC

## 2015-11-13 NOTE — Progress Notes (Signed)
Pre visit review using our clinic review tool, if applicable. No additional management support is needed unless otherwise documented below in the visit note. 

## 2015-11-13 NOTE — Progress Notes (Signed)
OFFICE VISIT  11/13/2015   CC:  Chief Complaint  Patient presents with  . Follow-up    Pneumonia    HPI:    Patient is a 82 y.o. Caucasian male who presents for 5 d f/u bronchopneumonia. CXR 11/08/15: Impression--Stable pleural thickening at the right lung base. Slight elevation of the right hemidiaphragm. No acute findings. Finished Z pack. Taking cough med day and night.  Feels better. Says cough is a lot better, less mucous.  No fevers or SOB. Eating and drinking pretty well. Sore all over chest from all the coughing.    Past Medical History  Diagnosis Date  . HTN (hypertension)   . Hypothyroidism   . Erectile dysfunction   . Adenomatous colon polyp 2012    Bx 2012 showed tubular adenoma but no HG dysplasia  . Hearing loss of both ears     +hearing aids  . Increased prostate specific antigen (PSA) velocity 11/11/2011    Biopsy negative 03/11/12.  Followed by Dr. Alinda Money (mild rise in PSA 2015, but Dr. Alinda Money and pt agreed to continue with annual PSA/DRE, no invasive procedure).  . Pleural effusion associated with pulmonary infection 04/2014    PULM: benign parapneumonic effusion; resolved 05/2014  . Pulmonary nodule 2015    with subcarinal adenopathy; stable on serial CT scans in 2015, but plan is to get PET scan around 11/18/14.    Past Surgical History  Procedure Laterality Date  . Knee surgery      2009, laperoscopic  . Umbilical hernia repair  1980  . Tonsillectomy  1970  . Cardiovascular stress test  07/06/14    NORMAL Myocardial perfusion imaging, normal EF, normal LV function, no wall motion abnormality    Outpatient Prescriptions Prior to Visit  Medication Sig Dispense Refill  . aspirin EC 81 MG tablet Take 81 mg by mouth daily.     Marland Kitchen atorvastatin (LIPITOR) 10 MG tablet TAKE 1 TABLET BY MOUTH EVERY DAY 90 tablet 1  . calcium carbonate (CALCIUM 600) 600 MG TABS Take 600 mg by mouth daily.     Marland Kitchen ibuprofen (ADVIL,MOTRIN) 800 MG tablet Take 800 mg by mouth every 8  (eight) hours as needed for mild pain.    Marland Kitchen levothyroxine (SYNTHROID, LEVOTHROID) 125 MCG tablet TAKE 1 TABLET BY MOUTH DAILY 90 tablet 3  . loratadine (CLARITIN) 10 MG tablet Take 10 mg by mouth daily.      . Multiple Vitamins-Minerals (CENTRUM SILVER PO) Take 1 tablet by mouth daily.      . niacin 500 MG tablet Take 500 mg by mouth daily with breakfast.     . Omega-3 Fatty Acids (FISH OIL) 1000 MG CAPS Take 1 capsule by mouth daily.     . pantoprazole (PROTONIX) 40 MG tablet Take 1 tablet (40 mg total) by mouth daily. 30 tablet 3  . Psyllium (METAMUCIL PO) Take 1 scoop by mouth daily.     Marland Kitchen HYDROcodone-homatropine (HYCODAN) 5-1.5 MG/5ML syrup 1-2 tsp po qhs prn cough 120 mL 0  . azithromycin (ZITHROMAX) 250 MG tablet 2 tabs po qd x 1d, then 1 tab po qd x 4d (Patient not taking: Reported on 11/13/2015) 6 tablet 0   No facility-administered medications prior to visit.    No Known Allergies  ROS As per HPI  PE: Blood pressure 126/75, pulse 60, temperature 97.8 F (36.6 C), temperature source Oral, resp. rate 16, height 5\' 5"  (1.651 m), weight 178 lb 4 oz (80.854 kg), SpO2 97 %. Gen: Alert,  well appearing.  Patient is oriented to person, place, time, and situation. [id number]: no injection, icteris, swelling, or exudate.  EOMI, PERRLA. Mouth: lips without lesion/swelling.  Oral mucosa pink and moist. Oropharynx without erythema, exudate, or swelling.  Neck - No masses or thyromegaly or limitation in range of motion CV: RRR, no m/r/g.   LUNGS: CTA bilat, nonlabored resps, good aeration in all lung fields.  Just a trace of soft, end-insp crackles at far L base.  Good aeration everywhere.  LABS:  none  IMPRESSION AND PLAN:  Bronchopneumonia, resolving appropriately. Continue symptomatic care, new rx for night-time hycodan, #121ml given today. Continue mucinex in daytime.  Push fluids, rest. Signs/symptoms to call or return for were reviewed and pt expressed understanding.  An After  Visit Summary was printed and given to the patient.  FOLLOW UP: Return if symptoms worsen or fail to improve.

## 2016-01-24 DIAGNOSIS — Z86010 Personal history of colonic polyps: Secondary | ICD-10-CM | POA: Insufficient documentation

## 2016-01-25 ENCOUNTER — Encounter: Payer: Self-pay | Admitting: Family Medicine

## 2016-02-05 ENCOUNTER — Telehealth: Payer: Self-pay | Admitting: Family Medicine

## 2016-02-05 DIAGNOSIS — M79671 Pain in right foot: Secondary | ICD-10-CM

## 2016-02-05 NOTE — Telephone Encounter (Signed)
Podiatry ([facility]) referral ordered as per pt's request.

## 2016-02-05 NOTE — Telephone Encounter (Signed)
Patient is having pain in R foot. He is requesting referral to a podiatrist in [place] or [place].

## 2016-02-05 NOTE — Telephone Encounter (Signed)
Please advise. Thanks.  

## 2016-02-06 NOTE — Telephone Encounter (Signed)
Pt advised and voiced understanding.   

## 2016-02-07 ENCOUNTER — Ambulatory Visit (INDEPENDENT_AMBULATORY_CARE_PROVIDER_SITE_OTHER): Payer: Federal, State, Local not specified - PPO

## 2016-02-07 ENCOUNTER — Ambulatory Visit (INDEPENDENT_AMBULATORY_CARE_PROVIDER_SITE_OTHER): Payer: Federal, State, Local not specified - PPO | Admitting: Podiatry

## 2016-02-07 ENCOUNTER — Encounter: Payer: Self-pay | Admitting: Podiatry

## 2016-02-07 VITALS — BP 137/87 | HR 74 | Resp 16

## 2016-02-07 DIAGNOSIS — M8430XA Stress fracture, unspecified site, initial encounter for fracture: Secondary | ICD-10-CM

## 2016-02-07 DIAGNOSIS — M79671 Pain in right foot: Secondary | ICD-10-CM | POA: Diagnosis not present

## 2016-02-07 NOTE — Progress Notes (Signed)
Subjective:     Patient ID: Joel Kelley, male   DOB: Mar 16, 1934, 82 y.o.   MRN: [id number]  HPI patient presents stating he has developed severe pain on top of his right foot and it's making it hard for him to bear weight on it. States that he's tried to reduce his activity levels but he needs to be on his foot and it's been present for several weeks   Review of Systems  All other systems reviewed and are negative.      Objective:   Physical Exam  Constitutional: He is oriented to person, place, and time.  Cardiovascular: Intact distal pulses.   Musculoskeletal: Normal range of motion.  Neurological: He is oriented to person, place, and time.  Skin: Skin is warm.  Nursing note and vitals reviewed.  neurovascular status intact muscle strength adequate range of motion within normal limits with patient found to have exquisite discomfort in the second metatarsal shaft proximal portion with inflammation fluid buildup noted. Patient's found to have good digital perfusion and is well oriented 3     Assessment:     Probable stress fracture of the right second metatarsal with possibility for inflammatory tendinitis or other condition    Plan:     H&P and x-ray reviewed with patient. Applied air fracture walker to completely immobilize the area and explained on ice therapy reduced activity and only wearing the boot when walking. Reappoint to recheck 3 weeks or earlier if needed  Report indicated a probable stress fracture of the proximal portion second metatarsal localized to this particular spot

## 2016-02-07 NOTE — Progress Notes (Signed)
   Subjective:    Patient ID: Joel Kelley, male    DOB: September 08, 1934, 82 y.o.   MRN: [id number]  HPI    Review of Systems  All other systems reviewed and are negative.      Objective:   Physical Exam        Assessment & Plan:

## 2016-02-20 DIAGNOSIS — H40023 Open angle with borderline findings, high risk, bilateral: Secondary | ICD-10-CM | POA: Diagnosis not present

## 2016-02-20 DIAGNOSIS — H25813 Combined forms of age-related cataract, bilateral: Secondary | ICD-10-CM | POA: Diagnosis not present

## 2016-02-28 ENCOUNTER — Ambulatory Visit (INDEPENDENT_AMBULATORY_CARE_PROVIDER_SITE_OTHER): Payer: Federal, State, Local not specified - PPO | Admitting: Podiatry

## 2016-02-28 ENCOUNTER — Ambulatory Visit (INDEPENDENT_AMBULATORY_CARE_PROVIDER_SITE_OTHER): Payer: Federal, State, Local not specified - PPO

## 2016-02-28 ENCOUNTER — Encounter: Payer: Self-pay | Admitting: Podiatry

## 2016-02-28 VITALS — BP 161/83 | HR 68 | Resp 16

## 2016-02-28 DIAGNOSIS — M8430XD Stress fracture, unspecified site, subsequent encounter for fracture with routine healing: Secondary | ICD-10-CM

## 2016-02-28 NOTE — Progress Notes (Signed)
Subjective:     Patient ID: Joel Kelley, male   DOB: [birth year], 82 y.o.   MRN: [id number]  HPI patient presents with continued edema on top the right foot with improvement and is wearing boots at current time   Review of Systems     Objective:   Physical Exam Neurovascular status intact muscle strength intact with patient found to have edema in the right forefoot around the second metatarsal mid shaft area with inflammation noted and mild pain    Assessment:     Stress fracture improving right    Plan:     Advised this patient on continued immobilization and anti-inflammatories ice therapy and gradual return soft shoe gear. Spent a great of time educating him on the long-term and things to watch out for  X-ray report indicated that there is a stress fracture of the mid shaft area second metatarsal right

## 2016-03-07 DIAGNOSIS — K080 Exfoliation of teeth due to systemic causes: Secondary | ICD-10-CM | POA: Diagnosis not present

## 2016-03-11 ENCOUNTER — Encounter: Payer: Self-pay | Admitting: Family Medicine

## 2016-03-11 ENCOUNTER — Encounter (HOSPITAL_BASED_OUTPATIENT_CLINIC_OR_DEPARTMENT_OTHER): Payer: Self-pay

## 2016-03-11 ENCOUNTER — Ambulatory Visit (HOSPITAL_BASED_OUTPATIENT_CLINIC_OR_DEPARTMENT_OTHER)
Admission: RE | Admit: 2016-03-11 | Discharge: 2016-03-11 | Disposition: A | Discharge status: Home / Self Care | Payer: Federal, State, Local not specified - PPO | Source: Ambulatory Visit | Attending: Family Medicine | Admitting: Family Medicine

## 2016-03-11 ENCOUNTER — Ambulatory Visit (INDEPENDENT_AMBULATORY_CARE_PROVIDER_SITE_OTHER): Payer: Federal, State, Local not specified - PPO | Admitting: Family Medicine

## 2016-03-11 VITALS — BP 150/91 | HR 70 | Temp 97.4°F | Ht 65.0 in | Wt 177.0 lb

## 2016-03-11 DIAGNOSIS — R911 Solitary pulmonary nodule: Secondary | ICD-10-CM | POA: Diagnosis not present

## 2016-03-11 DIAGNOSIS — K8020 Calculus of gallbladder without cholecystitis without obstruction: Secondary | ICD-10-CM | POA: Diagnosis not present

## 2016-03-11 DIAGNOSIS — J984 Other disorders of lung: Secondary | ICD-10-CM | POA: Diagnosis not present

## 2016-03-11 DIAGNOSIS — I2510 Atherosclerotic heart disease of native coronary artery without angina pectoris: Secondary | ICD-10-CM | POA: Diagnosis not present

## 2016-03-11 DIAGNOSIS — E039 Hypothyroidism, unspecified: Secondary | ICD-10-CM

## 2016-03-11 DIAGNOSIS — R918 Other nonspecific abnormal finding of lung field: Secondary | ICD-10-CM | POA: Diagnosis not present

## 2016-03-11 DIAGNOSIS — E785 Hyperlipidemia, unspecified: Secondary | ICD-10-CM

## 2016-03-11 HISTORY — DX: Calculus of gallbladder without cholecystitis without obstruction: K80.20

## 2016-03-11 LAB — COMPREHENSIVE METABOLIC PANEL WITH GFR
ALT: 14 U/L (ref 0–53)
AST: 15 U/L (ref 0–37)
Albumin: 4.1 g/dL (ref 3.5–5.2)
Alkaline Phosphatase: 62 U/L (ref 39–117)
BUN: 14 mg/dL (ref 6–23)
CO2: 29 meq/L (ref 19–32)
Calcium: 9.4 mg/dL (ref 8.4–10.5)
Chloride: 106 meq/L (ref 96–112)
Creatinine, Ser: 0.96 mg/dL (ref 0.40–1.50)
GFR: 80.15 mL/min (ref >60.00)
Glucose, Bld: 107 mg/dL — ABNORMAL HIGH (ref 70–99)
Potassium: 4.1 meq/L (ref 3.5–5.1)
Sodium: 142 meq/L (ref 135–145)
Total Bilirubin: 0.7 mg/dL (ref 0.2–1.2)
Total Protein: 6.6 g/dL (ref 6.0–8.3)

## 2016-03-11 LAB — LIPID PANEL
Cholesterol: 134 mg/dL (ref 0–200)
HDL: 42.1 mg/dL (ref >39.00)
LDL Cholesterol: 76 mg/dL (ref 0–99)
NonHDL: 91.88
Total CHOL/HDL Ratio: 3
Triglycerides: 77 mg/dL (ref 0.0–149.0)
VLDL: 15.4 mg/dL (ref 0.0–40.0)

## 2016-03-11 LAB — TSH: TSH: 4.11 u[IU]/mL (ref 0.35–4.50)

## 2016-03-11 NOTE — Progress Notes (Signed)
OFFICE VISIT  03/11/2016   CC:  Chief Complaint  Patient presents with  . Follow-up    HPI:    Patient is a 82 y.o.  male who presents for 6 mo f/u hypothyroidism, hyperlipidemia, and solitary pulm nodule. Currently dealing with R foot stress fracture--only took 4 weeks wearing a boot to get better, though, otherwise feeling well.  Compliant with levothyroxine--takes this with his cholest med, aspirin, and MVI.  This is the way he has always taken it.  He is active.  NO SIGNIFICANT COUGHING!  No SOB/DOE.  No hemoptysis.  No fevers, no wt loss.  Past Medical History  Diagnosis Date  . HTN (hypertension)   . Hypothyroidism   . Erectile dysfunction   . Adenomatous colon polyp 2012; 01/2016    Bx 2012 showed tubular adenoma but no HG dysplasia.  2017 : adenomatous.  Recall 3 yrs  . Hearing loss of both ears     +hearing aids  . Increased prostate specific antigen (PSA) velocity 11/11/2011    Biopsy negative 03/11/12.  Followed by Dr. Alinda Money (mild rise in PSA 2015, but Dr. Alinda Money and pt agreed to continue with annual PSA/DRE, no invasive procedure).  . Pleural effusion associated with pulmonary infection 04/2014    PULM: benign parapneumonic effusion; resolved 05/2014  . Pulmonary nodule 2015    with subcarinal adenopathy; stable on serial CT scans in 2015 and 09/2015.  Marland Kitchen Stress fracture of foot 2017    right    Past Surgical History  Procedure Laterality Date  . Knee surgery      2009, laperoscopic  . Umbilical hernia repair  1980  . Tonsillectomy  1970  . Cardiovascular stress test  07/06/14    NORMAL Myocardial perfusion imaging, normal EF, normal LV function, no wall motion abnormality  . Colonoscopy w/ polypectomy  2012; 01/2016    2017 adenomatous polyp: recall 3 yrs    Outpatient Prescriptions Prior to Visit  Medication Sig Dispense Refill  . aspirin EC 81 MG tablet Take 81 mg by mouth daily. Reported on 02/07/2016    . atorvastatin (LIPITOR) 10 MG tablet TAKE 1 TABLET BY  MOUTH EVERY DAY 90 tablet 1  . calcium carbonate (CALCIUM 600) 600 MG TABS Take 600 mg by mouth daily. Reported on 02/07/2016    . ibuprofen (ADVIL,MOTRIN) 800 MG tablet Take 800 mg by mouth every 8 (eight) hours as needed for mild pain. Reported on 02/07/2016    . levothyroxine (SYNTHROID, LEVOTHROID) 125 MCG tablet TAKE 1 TABLET BY MOUTH DAILY 90 tablet 3  . loratadine (CLARITIN) 10 MG tablet Take 10 mg by mouth daily. Reported on 02/07/2016    . Multiple Vitamins-Minerals (CENTRUM SILVER PO) Take 1 tablet by mouth daily. Reported on 02/07/2016    . niacin 500 MG tablet Take 500 mg by mouth daily with breakfast. Reported on 02/07/2016    . Omega-3 Fatty Acids (FISH OIL) 1000 MG CAPS Take 1 capsule by mouth daily. Reported on 02/07/2016    . pantoprazole (PROTONIX) 40 MG tablet Take 1 tablet (40 mg total) by mouth daily. 30 tablet 3  . Psyllium (METAMUCIL PO) Take 1 scoop by mouth daily. Reported on 02/07/2016    . HYDROcodone-homatropine (HYCODAN) 5-1.5 MG/5ML syrup 1-2 tsp po qhs prn cough 120 mL 0   No facility-administered medications prior to visit.    No Known Allergies  ROS As per HPI  PE: Blood pressure 150/91, pulse 70, temperature 97.4 F (36.3 C), temperature source Oral,  height 5\' 5"  (1.651 m), weight 177 lb (80.287 kg), SpO2 96 %. Gen: Alert, well appearing.  Patient is oriented to person, place, time, and situation. CV: RRR, distant S1 and S2, no m/r/g.   LUNGS: CTA bilat, nonlabored resps, good aeration in all lung fields. EXT: no clubbing, cyanosis, or edema.    LABS:    Chemistry      Component Value Date/Time   NA 140 10/17/2014 1527   K 3.8 10/17/2014 1527   CL 109 10/17/2014 1527   CO2 25 10/17/2014 1527   BUN 17 10/17/2014 1527   CREATININE 1.0 10/17/2014 1527      Component Value Date/Time   CALCIUM 8.7 10/17/2014 1527   ALKPHOS 59 10/17/2014 1527   AST 19 10/17/2014 1527   ALT 14 10/17/2014 1527   BILITOT 0.7 10/17/2014 1527     Lab Results  Component Value  Date   TSH 1.72 03/10/2015   Lab Results  Component Value Date   CHOL 127 03/10/2015   HDL 44.60 03/10/2015   LDLCALC 67 03/10/2015   TRIG 78.0 03/10/2015   CHOLHDL 3 03/10/2015     IMPRESSION AND PLAN:  1) Hypothyroidism: due for TSH check today.  2) Hyperlipidemia; due for FLP today as well as AST/ALT.  3) Chronic cough--upper airway cough syndrome: this seems to be resolved/well controlled.  4) Pulmonary nodule: discussed need to document stability for 2 years.  Per radiologist's recommendations on scan done 09/2015, we'll schedule a repeat CT chest now.  An After Visit Summary was printed and given to the patient.  FOLLOW UP: Return in about 6 months (around 09/11/2016) for annual CPE (fasting).  Signed:  Crissie Sickles, MD           03/11/2016

## 2016-03-11 NOTE — Progress Notes (Signed)
Pre visit review using our clinic review tool, if applicable. No additional management support is needed unless otherwise documented below in the visit note. 

## 2016-03-12 ENCOUNTER — Telehealth: Payer: Self-pay

## 2016-03-12 NOTE — Telephone Encounter (Signed)
-----   Message from Joel Sou, MD sent at 03/11/2016  5:28 PM EDT ----- Reassure pt that his chest CT scan showed that his pulmonary nodule was stable. Tell him we need to do ONE MORE CT chest in 6 months to document that it has been stable for 2 full years.-thx

## 2016-03-12 NOTE — Telephone Encounter (Signed)
Spoke to patient. Gave lab results and instructions. Patient verbalized understanding.

## 2016-03-12 NOTE — Telephone Encounter (Signed)
Spoke to patient. Gave CT results and instructions. Patient verbalized understanding.

## 2016-03-12 NOTE — Telephone Encounter (Signed)
-----   Message from Tammi Sou, MD sent at 03/11/2016  5:26 PM EDT ----- Pls notify Joel Kelley all labs came back normal except glucose was just slightly elevated at 107.  Tell him to try to minimize simple sugars as well as foods like potatoes, rice, pasta, and white breads.  -thx

## 2016-03-13 DIAGNOSIS — R972 Elevated prostate specific antigen [PSA]: Secondary | ICD-10-CM | POA: Diagnosis not present

## 2016-03-20 DIAGNOSIS — Z0000 Encounter for general adult medical examination without abnormal findings: Secondary | ICD-10-CM | POA: Diagnosis not present

## 2016-03-20 DIAGNOSIS — R972 Elevated prostate specific antigen [PSA]: Secondary | ICD-10-CM | POA: Diagnosis not present

## 2016-03-22 ENCOUNTER — Encounter: Payer: Self-pay | Admitting: Family Medicine

## 2016-03-29 ENCOUNTER — Other Ambulatory Visit: Payer: Self-pay | Admitting: Family Medicine

## 2016-03-29 NOTE — Telephone Encounter (Signed)
RF request for atorvastatin LOV: 03/11/16 Next ov: 09/11/16 Last written: 09/22/15 #90 w/ 1RF

## 2016-05-25 ENCOUNTER — Other Ambulatory Visit: Payer: Self-pay | Admitting: Family Medicine

## 2016-08-20 DIAGNOSIS — H40023 Open angle with borderline findings, high risk, bilateral: Secondary | ICD-10-CM | POA: Diagnosis not present

## 2016-08-20 DIAGNOSIS — H25813 Combined forms of age-related cataract, bilateral: Secondary | ICD-10-CM | POA: Diagnosis not present

## 2016-09-10 DIAGNOSIS — K080 Exfoliation of teeth due to systemic causes: Secondary | ICD-10-CM | POA: Diagnosis not present

## 2016-09-11 ENCOUNTER — Ambulatory Visit (INDEPENDENT_AMBULATORY_CARE_PROVIDER_SITE_OTHER): Payer: Federal, State, Local not specified - PPO | Admitting: Family Medicine

## 2016-09-11 ENCOUNTER — Encounter: Payer: Self-pay | Admitting: Family Medicine

## 2016-09-11 VITALS — BP 137/80 | HR 68 | Temp 97.8°F | Resp 16 | Ht 65.0 in | Wt 171.8 lb

## 2016-09-11 DIAGNOSIS — Z0000 Encounter for general adult medical examination without abnormal findings: Secondary | ICD-10-CM

## 2016-09-11 DIAGNOSIS — Z23 Encounter for immunization: Secondary | ICD-10-CM

## 2016-09-11 NOTE — Addendum Note (Signed)
Addended by: Gordy Councilman on: 09/11/2016 08:55 AM   Modules accepted: Orders

## 2016-09-11 NOTE — Progress Notes (Addendum)
Office Note 09/11/2016  CC:  Chief Complaint  Patient presents with  . Annual Exam    CPE    HPI:  Joel Kelley is a 82 y.o. White male who is here for annual health maintenance exam. Feels great.  Has been walking more--6 days a week now. Says cough/sputum/phlegm problem is very minimal now that he has been walking more. Diet: no particular diet.  Eye exam 2 weeks ago: cataracts still not big enough to remove.  Has f/u in 9 mo. Dental preventative visits UTD.  Past Medical History:  Diagnosis Date  . Adenomatous colon polyp 2012; 01/2016   Bx 2012 showed tubular adenoma but no HG dysplasia.  2017 : adenomatous.  Recall 3 yrs  . Asymptomatic cholelithiasis 2017  . Erectile dysfunction   . Hearing loss of both ears    +hearing aids  . HTN (hypertension)   . Hypothyroidism   . Increased prostate specific antigen (PSA) velocity 11/11/2011   Biopsy negative 03/11/12.  Followed by Dr. Alinda Money (mild rise in PSA 2015, but Dr. Alinda Money and pt agreed to continue with annual PSA/DRE, no invasive procedure). PSA stable as of 03/2016 urologic f/u.  Marland Kitchen Pleural effusion associated with pulmonary infection 04/2014   PULM: benign parapneumonic effusion; resolved 05/2014  . Pulmonary nodule 2015   with subcarinal adenopathy; stable on serial CT scans in 2015 and 09/2015, and 03/2016.  Plan to do one more CT at the end of 2017 in order to document 2 full years of stability.  . Stress fracture of foot 2017   right    Past Surgical History:  Procedure Laterality Date  . CARDIOVASCULAR STRESS TEST  07/06/14   NORMAL Myocardial perfusion imaging, normal EF, normal LV function, no wall motion abnormality  . COLONOSCOPY W/ POLYPECTOMY  2012; 01/2016   2017 adenomatous polyp: recall 3 yrs  . KNEE SURGERY     2009, laperoscopic  . TONSILLECTOMY  1970  . UMBILICAL HERNIA REPAIR  1980    Family History  Problem Relation Age of Onset  . Hypertension Mother   . Hypertension Father     Social History    Social History  . Marital status: Married    Spouse name: [name]  . Number of children: N/A  . Years of education: N/A   Occupational History  . Retired Actor     Social History Main Topics  . Smoking status: Former Smoker    Packs/day: 0.25    Years: 40.00    Types: Cigarettes    Quit date: 11/04/1988  . Smokeless tobacco: Never Used  . Alcohol use No     Comment: quit in 1990   . Drug use: No  . Sexual activity: Not on file   Other Topics Concern  . Not on file   Social History Narrative   Divorced, remarried, one son and one step daughter (step-daughter is local), 2 grandchildren.   Relocated from [place] about 2010.   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   Distant tobacco abuse: avg 5 cigs day for 20 yrs on and off, quit for good around 1990.   No alcohol.   Daily walking at the park.    Outpatient Medications Prior to Visit  Medication Sig Dispense Refill  . aspirin EC 81 MG tablet Take 81 mg by mouth daily. Reported on 02/07/2016    . atorvastatin (LIPITOR) 10 MG tablet TAKE 1 TABLET BY MOUTH EVERY DAY 90 tablet 3  . calcium carbonate (CALCIUM 600) 600 MG  TABS Take 600 mg by mouth daily. Reported on 02/07/2016    . ibuprofen (ADVIL,MOTRIN) 800 MG tablet Take 800 mg by mouth every 8 (eight) hours as needed for mild pain. Reported on 02/07/2016    . levothyroxine (SYNTHROID, LEVOTHROID) 125 MCG tablet TAKE 1 TABLET BY MOUTH DAILY 90 tablet 3  . loratadine (CLARITIN) 10 MG tablet Take 10 mg by mouth daily. Reported on 02/07/2016    . Multiple Vitamins-Minerals (CENTRUM SILVER PO) Take 1 tablet by mouth daily. Reported on 02/07/2016    . niacin 500 MG tablet Take 500 mg by mouth daily with breakfast. Reported on 02/07/2016    . Omega-3 Fatty Acids (FISH OIL) 1000 MG CAPS Take 1 capsule by mouth daily. Reported on 02/07/2016    . pantoprazole (PROTONIX) 40 MG tablet Take 1 tablet (40 mg total) by mouth daily. 30 tablet 3  . Psyllium (METAMUCIL PO) Take 1 scoop by mouth daily. Reported  on 02/07/2016     No facility-administered medications prior to visit.     No Known Allergies  ROS Review of Systems  Constitutional: Negative for appetite change, chills, fatigue and fever.  HENT: Negative for congestion, dental problem, ear pain and sore throat.   Eyes: Negative for discharge, redness and visual disturbance.  Respiratory: Negative for cough, chest tightness, shortness of breath and wheezing.   Cardiovascular: Negative for chest pain, palpitations and leg swelling.  Gastrointestinal: Negative for abdominal pain, blood in stool, diarrhea, nausea and vomiting.  Genitourinary: Negative for difficulty urinating, dysuria, flank pain, frequency, hematuria and urgency.  Musculoskeletal: Negative for arthralgias, back pain, joint swelling, myalgias and neck stiffness.  Skin: Negative for pallor and rash.  Neurological: Negative for dizziness, speech difficulty, weakness and headaches.  Hematological: Negative for adenopathy. Does not bruise/bleed easily.  Psychiatric/Behavioral: Negative for confusion and sleep disturbance. The patient is not nervous/anxious.     PE; Blood pressure 137/80, pulse 68, temperature 97.8 F (36.6 C), temperature source Temporal, resp. rate 16, height 5\' 5"  (1.651 m), weight 171 lb 12.8 oz (77.9 kg), SpO2 97 %. Gen: Alert, well appearing.  Patient is oriented to person, place, time, and situation. AFFECT: pleasant, lucid thought and speech. ENT: Ears: EACs clear, normal epithelium.  TMs with good light reflex and landmarks bilaterally.  Eyes: no injection, icteris, swelling, or exudate.  EOMI, PERRLA. Nose: no drainage or turbinate edema/swelling.  No injection or focal lesion.  Mouth: lips without lesion/swelling.  Oral mucosa pink and moist.  Dentition intact and without obvious caries or gingival swelling.  Oropharynx without erythema, exudate, or swelling.  Neck: supple/nontender.  No LAD, mass, or TM.  Carotid pulses 2+ bilaterally, without  bruits. CV: RRR, no m/r/g.   LUNGS: CTA bilat, nonlabored resps, good aeration in all lung fields. ABD: soft, NT, ND, BS normal.  No hepatospenomegaly or mass.  No bruits. EXT: no clubbing, cyanosis, or edema.  Musculoskeletal: no joint swelling, erythema, warmth, or tenderness.  ROM of all joints intact. Skin - no sores or suspicious lesions or rashes or color changes Rectal : deferred b/c this is followed by pt's urologist.  Pertinent labs:  Lab Results  Component Value Date   TSH 4.11 03/11/2016   Lab Results  Component Value Date   WBC 5.2 10/17/2014   HGB 15.5 10/17/2014   HCT 46.3 10/17/2014   MCV 95.6 10/17/2014   PLT 199.0 10/17/2014   Lab Results  Component Value Date   CREATININE 0.96 03/11/2016   BUN 14 03/11/2016  NA 142 03/11/2016   K 4.1 03/11/2016   CL 106 03/11/2016   CO2 29 03/11/2016   Lab Results  Component Value Date   ALT 14 03/11/2016   AST 15 03/11/2016   ALKPHOS 62 03/11/2016   BILITOT 0.7 03/11/2016   Lab Results  Component Value Date   CHOL 134 03/11/2016   Lab Results  Component Value Date   HDL 42.10 03/11/2016   Lab Results  Component Value Date   LDLCALC 76 03/11/2016   Lab Results  Component Value Date   TRIG 77.0 03/11/2016   Lab Results  Component Value Date   CHOLHDL 3 03/11/2016   Lab Results  Component Value Date   PSA 3.94 11/11/2011   PSA 2.61 12/17/2010    ASSESSMENT AND PLAN:   Health maintenance exam: Reviewed age and gender appropriate health maintenance issues (prudent diet, regular exercise, health risks of tobacco and excessive alcohol, use of seatbelts, fire alarms in home, use of sunscreen).  Also reviewed age and gender appropriate health screening as well as vaccine recommendations. Flu vaccine given today. No labs due today. Colon ca screening: next colonoscopy due 2020. Prostate ca screening: hx of elevated PSA, bx benign 2013, followed with DRE and PSA by Dr. Alinda Money, most recent stable  03/2016.  An After Visit Summary was printed and given to the patient.  FOLLOW UP:  Return in about 6 months (around 03/11/2017) for routine chronic illness f/u.  Signed:  Crissie Sickles, MD           09/11/2016

## 2016-09-11 NOTE — Progress Notes (Signed)
Pre visit review using our clinic review tool, if applicable. No additional management support is needed unless otherwise documented below in the visit note. 

## 2017-03-10 ENCOUNTER — Ambulatory Visit (INDEPENDENT_AMBULATORY_CARE_PROVIDER_SITE_OTHER): Payer: Federal, State, Local not specified - PPO | Admitting: Family Medicine

## 2017-03-10 ENCOUNTER — Ambulatory Visit (HOSPITAL_BASED_OUTPATIENT_CLINIC_OR_DEPARTMENT_OTHER)
Admission: RE | Admit: 2017-03-10 | Discharge: 2017-03-10 | Disposition: A | Discharge status: Home / Self Care | Payer: Federal, State, Local not specified - PPO | Source: Ambulatory Visit | Attending: Family Medicine | Admitting: Family Medicine

## 2017-03-10 ENCOUNTER — Encounter: Payer: Self-pay | Admitting: Family Medicine

## 2017-03-10 VITALS — BP 123/73 | HR 71 | Temp 97.7°F | Resp 16 | Ht 65.0 in | Wt 179.5 lb

## 2017-03-10 DIAGNOSIS — I2510 Atherosclerotic heart disease of native coronary artery without angina pectoris: Secondary | ICD-10-CM | POA: Diagnosis not present

## 2017-03-10 DIAGNOSIS — E039 Hypothyroidism, unspecified: Secondary | ICD-10-CM

## 2017-03-10 DIAGNOSIS — I700 Atherosclerosis of aorta: Secondary | ICD-10-CM | POA: Diagnosis not present

## 2017-03-10 DIAGNOSIS — R911 Solitary pulmonary nodule: Secondary | ICD-10-CM | POA: Diagnosis not present

## 2017-03-10 DIAGNOSIS — K219 Gastro-esophageal reflux disease without esophagitis: Secondary | ICD-10-CM | POA: Diagnosis not present

## 2017-03-10 DIAGNOSIS — E7800 Pure hypercholesterolemia, unspecified: Secondary | ICD-10-CM | POA: Diagnosis not present

## 2017-03-10 LAB — LIPID PANEL
CHOLESTEROL: 130 mg/dL (ref 0–200)
HDL: 42.2 mg/dL (ref >39.00)
LDL Cholesterol: 68 mg/dL (ref 0–99)
NonHDL: 87.69
TRIGLYCERIDES: 96 mg/dL (ref 0.0–149.0)
Total CHOL/HDL Ratio: 3
VLDL: 19.2 mg/dL (ref 0.0–40.0)

## 2017-03-10 LAB — COMPREHENSIVE METABOLIC PANEL
ALBUMIN: 4.1 g/dL (ref 3.5–5.2)
ALT: 14 U/L (ref 0–53)
AST: 20 U/L (ref 0–37)
Alkaline Phosphatase: 53 U/L (ref 39–117)
BILIRUBIN TOTAL: 0.8 mg/dL (ref 0.2–1.2)
BUN: 19 mg/dL (ref 6–23)
CALCIUM: 9.2 mg/dL (ref 8.4–10.5)
CO2: 29 meq/L (ref 19–32)
CREATININE: 0.98 mg/dL (ref 0.40–1.50)
Chloride: 106 mEq/L (ref 96–112)
GFR: 78.07 mL/min (ref >60.00)
Glucose, Bld: 107 mg/dL — ABNORMAL HIGH (ref 70–99)
Potassium: 4.4 mEq/L (ref 3.5–5.1)
Sodium: 141 mEq/L (ref 135–145)
Total Protein: 6.5 g/dL (ref 6.0–8.3)

## 2017-03-10 LAB — TSH: TSH: 8.43 u[IU]/mL — AB (ref 0.35–4.50)

## 2017-03-10 NOTE — Progress Notes (Signed)
Pre visit review using our clinic review tool, if applicable. No additional management support is needed unless otherwise documented below in the visit note. 

## 2017-03-10 NOTE — Progress Notes (Signed)
OFFICE VISIT  03/10/2017   CC:  Chief Complaint  Patient presents with  . Follow-up    RCI, pt is fasting.    HPI:    Patient is a 83 y.o. Caucasian male who presents for here for 6 mo f/u hyperlipidemia, hypothyroidism, GERD,and pulmonary nodule.   Plan on repeat CT chest to f/u pulm nodule--if stable this would document 2 yrs of stability and thus benign etiology and no further CT's needed.  Exercising 5 miles a day.  HLD: taking statin, no side effect. Hypothyroidism: taking med correctly. GERD: takes PPI daily and tries to eat reflux-friendly diet. Has not tried to ween off this med.   Past Medical History:  Diagnosis Date  . Adenomatous colon polyp 2012; 01/2016   Bx 2012 showed tubular adenoma but no HG dysplasia.  2017 : adenomatous.  Recall 3 yrs  . Asymptomatic cholelithiasis 2017  . Erectile dysfunction   . Hearing loss of both ears    +hearing aids  . HTN (hypertension)   . Hypothyroidism   . Increased prostate specific antigen (PSA) velocity 11/11/2011   Biopsy negative 03/11/12.  Followed by Dr. Alinda Money (mild rise in PSA 2015, but Dr. Alinda Money and pt agreed to continue with annual PSA/DRE, no invasive procedure). PSA stable as of 03/2016 urologic f/u.  Marland Kitchen Pleural effusion associated with pulmonary infection 04/2014   PULM: benign parapneumonic effusion; resolved 05/2014  . Pulmonary nodule 2015   with subcarinal adenopathy; stable on serial CT scans in 2015 and 09/2015, and 03/2016.  Plan to do one more CT at the end of 2017 in order to document 2 full years of stability.  . Stress fracture of foot 2017   right    Past Surgical History:  Procedure Laterality Date  . CARDIOVASCULAR STRESS TEST  07/06/14   NORMAL Myocardial perfusion imaging, normal EF, normal LV function, no wall motion abnormality  . COLONOSCOPY W/ POLYPECTOMY  2012; 01/2016   2017 adenomatous polyp: recall 3 yrs  . KNEE SURGERY     2009, laperoscopic  . TONSILLECTOMY  1970  . [place]    Outpatient Medications Prior to Visit  Medication Sig Dispense Refill  . aspirin EC 81 MG tablet Take 81 mg by mouth daily. Reported on 02/07/2016    . atorvastatin (LIPITOR) 10 MG tablet TAKE 1 TABLET BY MOUTH EVERY DAY 90 tablet 3  . calcium carbonate (CALCIUM 600) 600 MG TABS Take 600 mg by mouth daily. Reported on 02/07/2016    . ibuprofen (ADVIL,MOTRIN) 800 MG tablet Take 800 mg by mouth every 8 (eight) hours as needed for mild pain. Reported on 02/07/2016    . levothyroxine (SYNTHROID, LEVOTHROID) 125 MCG tablet TAKE 1 TABLET BY MOUTH DAILY 90 tablet 3  . loratadine (CLARITIN) 10 MG tablet Take 10 mg by mouth daily. Reported on 02/07/2016    . Multiple Vitamins-Minerals (CENTRUM SILVER PO) Take 1 tablet by mouth daily. Reported on 02/07/2016    . niacin 500 MG tablet Take 500 mg by mouth daily with breakfast. Reported on 02/07/2016    . Omega-3 Fatty Acids (FISH OIL) 1000 MG CAPS Take 1 capsule by mouth daily. Reported on 02/07/2016    . pantoprazole (PROTONIX) 40 MG tablet Take 1 tablet (40 mg total) by mouth daily. 30 tablet 3  . Psyllium (METAMUCIL PO) Take 1 scoop by mouth daily. Reported on 02/07/2016     No facility-administered medications prior to visit.     No Known  Allergies  ROS As per HPI  PE: Blood pressure 123/73, pulse 71, temperature 97.7 F (36.5 C), temperature source Oral, resp. rate 16, height 5\' 5"  (1.651 m), weight 179 lb 8 oz (81.4 kg), SpO2 97 %. Gen: Alert, well appearing.  Patient is oriented to person, place, time, and situation. CV: RRR, no m/r/g.   LUNGS: CTA bilat, nonlabored resps, good aeration in all lung fields. EXT: 2+ pitting edema bilat LE's.  No clubbing or cyanosis.  LABS:  Lab Results  Component Value Date   TSH 4.11 03/11/2016   Lab Results  Component Value Date   WBC 5.2 10/17/2014   HGB 15.5 10/17/2014   HCT 46.3 10/17/2014   MCV 95.6 10/17/2014   PLT 199.0 10/17/2014   Lab Results  Component Value Date   CREATININE 0.96  03/11/2016   BUN 14 03/11/2016   NA 142 03/11/2016   K 4.1 03/11/2016   CL 106 03/11/2016   CO2 29 03/11/2016   Lab Results  Component Value Date   ALT 14 03/11/2016   AST 15 03/11/2016   ALKPHOS 62 03/11/2016   BILITOT 0.7 03/11/2016   Lab Results  Component Value Date   CHOL 134 03/11/2016   Lab Results  Component Value Date   HDL 42.10 03/11/2016   Lab Results  Component Value Date   LDLCALC 76 03/11/2016   Lab Results  Component Value Date   TRIG 77.0 03/11/2016   Lab Results  Component Value Date   CHOLHDL 3 03/11/2016   Lab Results  Component Value Date   PSA 3.94 11/11/2011   PSA 2.61 12/17/2010     IMPRESSION AND PLAN:  1) Pulmonary nodule: ordered repeat CT chest w/out contrast to follow this up. Last one if all stable.  2) GERD: encouraged ween of pantoprazole if he wants to try--discussed proper way to do this to avoid rebound reflux.  3) HLD: tolerating statin, check lipids and CMET today.  4) Hypothyroidism: due for TSH monitoring today.  FOLLOW UP: Return in about 6 months (around 09/10/2017) for annual CPE (fasting).  Signed:  Crissie Sickles, MD           03/10/2017

## 2017-03-12 ENCOUNTER — Other Ambulatory Visit: Payer: Self-pay

## 2017-03-12 DIAGNOSIS — E039 Hypothyroidism, unspecified: Secondary | ICD-10-CM

## 2017-03-13 DIAGNOSIS — K080 Exfoliation of teeth due to systemic causes: Secondary | ICD-10-CM | POA: Diagnosis not present

## 2017-03-21 ENCOUNTER — Other Ambulatory Visit: Payer: Self-pay | Admitting: Family Medicine

## 2017-04-15 ENCOUNTER — Other Ambulatory Visit (INDEPENDENT_AMBULATORY_CARE_PROVIDER_SITE_OTHER): Payer: Federal, State, Local not specified - PPO

## 2017-04-15 DIAGNOSIS — E039 Hypothyroidism, unspecified: Secondary | ICD-10-CM | POA: Diagnosis not present

## 2017-04-15 LAB — TSH: TSH: 0.44 u[IU]/mL (ref 0.35–4.50)

## 2017-04-23 DIAGNOSIS — R972 Elevated prostate specific antigen [PSA]: Secondary | ICD-10-CM | POA: Diagnosis not present

## 2017-04-30 DIAGNOSIS — R972 Elevated prostate specific antigen [PSA]: Secondary | ICD-10-CM | POA: Diagnosis not present

## 2017-05-01 ENCOUNTER — Encounter: Payer: Self-pay | Admitting: Family Medicine

## 2017-06-07 ENCOUNTER — Other Ambulatory Visit: Payer: Self-pay | Admitting: Family Medicine

## 2017-09-12 ENCOUNTER — Encounter: Payer: Self-pay | Admitting: Family Medicine

## 2017-09-12 ENCOUNTER — Other Ambulatory Visit: Payer: Self-pay

## 2017-09-12 ENCOUNTER — Ambulatory Visit (INDEPENDENT_AMBULATORY_CARE_PROVIDER_SITE_OTHER): Payer: Federal, State, Local not specified - PPO | Admitting: Family Medicine

## 2017-09-12 VITALS — BP 130/71 | HR 63 | Temp 97.6°F | Resp 16 | Ht 65.0 in | Wt 175.5 lb

## 2017-09-12 DIAGNOSIS — E7800 Pure hypercholesterolemia, unspecified: Secondary | ICD-10-CM | POA: Diagnosis not present

## 2017-09-12 DIAGNOSIS — Z0000 Encounter for general adult medical examination without abnormal findings: Secondary | ICD-10-CM

## 2017-09-12 DIAGNOSIS — E039 Hypothyroidism, unspecified: Secondary | ICD-10-CM

## 2017-09-12 DIAGNOSIS — Z23 Encounter for immunization: Secondary | ICD-10-CM

## 2017-09-12 LAB — LIPID PANEL
Cholesterol: 143 mg/dL (ref 0–200)
HDL: 46.9 mg/dL (ref >39.00)
LDL Cholesterol: 84 mg/dL (ref 0–99)
NonHDL: 95.7
TRIGLYCERIDES: 60 mg/dL (ref 0.0–149.0)
Total CHOL/HDL Ratio: 3
VLDL: 12 mg/dL (ref 0.0–40.0)

## 2017-09-12 LAB — COMPREHENSIVE METABOLIC PANEL
ALT: 12 U/L (ref 0–53)
AST: 17 U/L (ref 0–37)
Albumin: 4 g/dL (ref 3.5–5.2)
Alkaline Phosphatase: 58 U/L (ref 39–117)
BUN: 20 mg/dL (ref 6–23)
CALCIUM: 9 mg/dL (ref 8.4–10.5)
CHLORIDE: 106 meq/L (ref 96–112)
CO2: 28 meq/L (ref 19–32)
Creatinine, Ser: 1.02 mg/dL (ref 0.40–1.50)
GFR: 74.45 mL/min (ref >60.00)
Glucose, Bld: 102 mg/dL — ABNORMAL HIGH (ref 70–99)
Potassium: 3.9 mEq/L (ref 3.5–5.1)
Sodium: 142 mEq/L (ref 135–145)
TOTAL PROTEIN: 6.6 g/dL (ref 6.0–8.3)
Total Bilirubin: 0.8 mg/dL (ref 0.2–1.2)

## 2017-09-12 LAB — TSH: TSH: 9.01 u[IU]/mL — AB (ref 0.35–4.50)

## 2017-09-12 NOTE — Addendum Note (Signed)
Addended by: Onalee Hua on: 09/12/2017 09:33 AM   Modules accepted: Orders

## 2017-09-12 NOTE — Progress Notes (Signed)
Office Note 09/12/2017  CC:  Chief Complaint  Patient presents with  . Annual Exam    Pt is fasting.     HPI:  Joel Kelley is a 83 y.o. White male who is here for annual health maintenance exam.  Eyes: exam UTD. Exercise: taking care of wife after hip replacement; just started walking regimen again. Dental preventatives q6 mo. Diet: not working on anything.  Past Medical History:  Diagnosis Date  . Adenomatous colon polyp 2012; 01/2016   Bx 2012 showed tubular adenoma but no HG dysplasia.  2017 : adenomatous.  Recall 3 yrs  . Asymptomatic cholelithiasis 2017  . Erectile dysfunction   . Hearing loss of both ears    +hearing aids  . HTN (hypertension)   . Hypothyroidism   . Increased prostate specific antigen (PSA) velocity 11/11/2011   Biopsy negative 03/11/12.  Followed by Dr. Alinda Money (mild rise in PSA 2015, but Dr. Alinda Money and pt agreed to continue with annual PSA/DRE, no invasive procedure).  PSA 4.19 04/2017.  Dr. Alinda Money recommended no further PSA testing, just annual DRE.  Marland Kitchen Pleural effusion associated with pulmonary infection 04/2014   PULM: benign parapneumonic effusion; resolved 05/2014  . Pulmonary nodule 2015   with subcarinal adenopathy; stable on serial CT scans in 2015 and 09/2015, and 03/2016.  F/u CT 03/2017 stable---MAY BE CONSIDERED BENIGN.  NO FURTHER CT's needed.  . Stress fracture of foot 2017   right    Past Surgical History:  Procedure Laterality Date  . CARDIOVASCULAR STRESS TEST  07/06/14   NORMAL Myocardial perfusion imaging, normal EF, normal LV function, no wall motion abnormality  . COLONOSCOPY W/ POLYPECTOMY  2012; 01/2016   2017 adenomatous polyp: recall 3 yrs  . KNEE SURGERY     2009, laperoscopic  . TONSILLECTOMY  1970  . UMBILICAL HERNIA REPAIR  1980    Family History  Problem Relation Age of Onset  . Hypertension Mother   . Hypertension Father     Social History   Socioeconomic History  . Marital status: Married    Spouse name:  [name]  . Number of children: Not on file  . Years of education: Not on file  . Highest education level: Not on file  Social Needs  . Financial resource strain: Not on file  . Food insecurity - worry: Not on file  . Food insecurity - inability: Not on file  . Transportation needs - medical: Not on file  . Transportation needs - non-medical: Not on file  Occupational History  . Occupation: Retired Actor   Tobacco Use  . Smoking status: Former Smoker    Packs/day: 0.25    Years: 40.00    Pack years: 10.00    Types: Cigarettes    Last attempt to quit: 11/04/1988    Years since quitting: 28.8  . Smokeless tobacco: Never Used  Substance and Sexual Activity  . Alcohol use: No    Comment: quit in 1990   . Drug use: No  . Sexual activity: Not on file  Other Topics Concern  . Not on file  Social History Narrative   Divorced, remarried, one son and one step daughter (step-daughter is local), 2 grandchildren.   Relocated from [place] about 2010.   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   Distant tobacco abuse: avg 5 cigs day for 20 yrs on and off, quit for good around 1990.   No alcohol.   Daily walking at the park.    Outpatient Medications Prior  to Visit  Medication Sig Dispense Refill  . aspirin EC 81 MG tablet Take 81 mg by mouth daily. Reported on 02/07/2016    . atorvastatin (LIPITOR) 10 MG tablet TAKE 1 TABLET BY MOUTH EVERY DAY 90 tablet 1  . calcium carbonate (CALCIUM 600) 600 MG TABS Take 600 mg by mouth daily. Reported on 02/07/2016    . ibuprofen (ADVIL,MOTRIN) 800 MG tablet Take 800 mg by mouth every 8 (eight) hours as needed for mild pain. Reported on 02/07/2016    . levothyroxine (SYNTHROID, LEVOTHROID) 125 MCG tablet TAKE 1 TABLET BY MOUTH DAILY 90 tablet 1  . loratadine (CLARITIN) 10 MG tablet Take 10 mg by mouth daily. Reported on 02/07/2016    . Multiple Vitamins-Minerals (CENTRUM SILVER PO) Take 1 tablet by mouth daily. Reported on 02/07/2016    . niacin 500 MG tablet Take 500  mg by mouth daily with breakfast. Reported on 02/07/2016    . Omega-3 Fatty Acids (FISH OIL) 1000 MG CAPS Take 1 capsule by mouth daily. Reported on 02/07/2016    . pantoprazole (PROTONIX) 40 MG tablet Take 1 tablet (40 mg total) by mouth daily. 30 tablet 3  . Psyllium (METAMUCIL PO) Take 1 scoop by mouth daily. Reported on 02/07/2016     No facility-administered medications prior to visit.     No Known Allergies  ROS Review of Systems  Constitutional: Negative for appetite change, chills, fatigue and fever.  HENT: Negative for congestion, dental problem, ear pain and sore throat.   Eyes: Negative for discharge, redness and visual disturbance.  Respiratory: Negative for cough, chest tightness, shortness of breath and wheezing.   Cardiovascular: Negative for chest pain, palpitations and leg swelling.  Gastrointestinal: Negative for abdominal pain, blood in stool, diarrhea, nausea and vomiting.  Genitourinary: Negative for difficulty urinating, dysuria, flank pain, frequency, hematuria and urgency.  Musculoskeletal: Negative for arthralgias, back pain, joint swelling, myalgias and neck stiffness.  Skin: Negative for pallor and rash.  Neurological: Negative for dizziness, speech difficulty, weakness and headaches.  Hematological: Negative for adenopathy. Does not bruise/bleed easily.  Psychiatric/Behavioral: Negative for confusion and sleep disturbance. The patient is not nervous/anxious.     PE; Blood pressure 130/71, pulse 63, temperature 97.6 F (36.4 C), temperature source Oral, resp. rate 16, height 5\' 5"  (1.651 m), weight 175 lb 8 oz (79.6 kg), SpO2 96 %. Body mass index is 29.2 kg/m.  Gen: Alert, well appearing.  Patient is oriented to person, place, time, and situation. AFFECT: pleasant, lucid thought and speech. ENT: Ears: EACs clear, normal epithelium.  TMs with good light reflex and landmarks bilaterally.  Eyes: no injection, icteris, swelling, or exudate.  EOMI, PERRLA. Nose: no  drainage or turbinate edema/swelling.  No injection or focal lesion.  Mouth: lips without lesion/swelling.  Oral mucosa pink and moist.  Dentition intact and without obvious caries or gingival swelling.  Oropharynx without erythema, exudate, or swelling.  Neck: supple/nontender.  No LAD, mass, or TM.  Carotid pulses 2+ bilaterally, without bruits. CV: RRR, no m/r/g.   LUNGS: CTA bilat, nonlabored resps, good aeration in all lung fields. ABD: soft, NT, ND, BS normal.  No hepatospenomegaly or mass.  No bruits. EXT: no clubbing, cyanosis, or edema.  Musculoskeletal: no joint swelling, erythema, warmth, or tenderness.  ROM of all joints intact. Skin - no sores or suspicious lesions or rashes or color changes Rectal : deferred--this was done 04/2017 by urologist.  Pertinent labs:  Lab Results  Component Value Date  TSH 0.44 04/15/2017   Lab Results  Component Value Date   WBC 5.2 10/17/2014   HGB 15.5 10/17/2014   HCT 46.3 10/17/2014   MCV 95.6 10/17/2014   PLT 199.0 10/17/2014   Lab Results  Component Value Date   CREATININE 0.98 03/10/2017   BUN 19 03/10/2017   NA 141 03/10/2017   K 4.4 03/10/2017   CL 106 03/10/2017   CO2 29 03/10/2017   Lab Results  Component Value Date   ALT 14 03/10/2017   AST 20 03/10/2017   ALKPHOS 53 03/10/2017   BILITOT 0.8 03/10/2017   Lab Results  Component Value Date   CHOL 130 03/10/2017   Lab Results  Component Value Date   HDL 42.20 03/10/2017   Lab Results  Component Value Date   LDLCALC 68 03/10/2017   Lab Results  Component Value Date   TRIG 96.0 03/10/2017   Lab Results  Component Value Date   CHOLHDL 3 03/10/2017   Lab Results  Component Value Date   PSA 3.94 11/11/2011   PSA 2.61 12/17/2010    ASSESSMENT AND PLAN:   Health maintenance exam: Reviewed age and gender appropriate health maintenance issues (prudent diet, regular exercise, health risks of tobacco and excessive alcohol, use of seatbelts, fire alarms in  home, use of sunscreen).  Also reviewed age and gender appropriate health screening as well as vaccine recommendations. Vaccines: flu vaccine today.  Otherwise UTD.  Shingrix discussed--rx given to pt today. Labs: TSH, CMET, FLP. Prostate ca screening: hx of elevated PSA--PSA stable 02/2017; urol recommends only annual DRE's and this was most recently done 04/2017 at [facility] and this was normal (no further PSA monitoring). Colon cancer screening: next colonoscopy due 01/2019.  An After Visit Summary was printed and given to the patient.  FOLLOW UP:  Return in about 6 months (around 03/12/2018) for routine chronic illness f/u.  Signed:  Crissie Sickles, MD           09/12/2017

## 2017-09-12 NOTE — Patient Instructions (Signed)

## 2017-09-14 ENCOUNTER — Other Ambulatory Visit: Payer: Self-pay | Admitting: Family Medicine

## 2017-09-14 MED ORDER — LEVOTHYROXINE SODIUM 137 MCG PO TABS: 137.0000 ug | ORAL_TABLET | Freq: Every day | ORAL | 2 refills | Status: DC

## 2017-09-15 ENCOUNTER — Other Ambulatory Visit: Payer: Self-pay | Admitting: *Deleted

## 2017-09-15 DIAGNOSIS — E039 Hypothyroidism, unspecified: Secondary | ICD-10-CM

## 2017-09-18 DIAGNOSIS — K080 Exfoliation of teeth due to systemic causes: Secondary | ICD-10-CM | POA: Diagnosis not present

## 2017-10-02 DIAGNOSIS — H25811 Combined forms of age-related cataract, right eye: Secondary | ICD-10-CM | POA: Diagnosis not present

## 2017-10-09 DIAGNOSIS — H2511 Age-related nuclear cataract, right eye: Secondary | ICD-10-CM | POA: Diagnosis not present

## 2017-10-09 DIAGNOSIS — H25811 Combined forms of age-related cataract, right eye: Secondary | ICD-10-CM | POA: Diagnosis not present

## 2017-10-16 DIAGNOSIS — H2513 Age-related nuclear cataract, bilateral: Secondary | ICD-10-CM | POA: Diagnosis not present

## 2017-10-23 DIAGNOSIS — H25812 Combined forms of age-related cataract, left eye: Secondary | ICD-10-CM | POA: Diagnosis not present

## 2017-10-23 DIAGNOSIS — H2512 Age-related nuclear cataract, left eye: Secondary | ICD-10-CM | POA: Diagnosis not present

## 2017-10-30 ENCOUNTER — Other Ambulatory Visit (INDEPENDENT_AMBULATORY_CARE_PROVIDER_SITE_OTHER): Payer: Federal, State, Local not specified - PPO

## 2017-10-30 DIAGNOSIS — E039 Hypothyroidism, unspecified: Secondary | ICD-10-CM

## 2017-10-30 DIAGNOSIS — H2513 Age-related nuclear cataract, bilateral: Secondary | ICD-10-CM | POA: Diagnosis not present

## 2017-10-31 ENCOUNTER — Other Ambulatory Visit: Payer: Self-pay | Admitting: Family Medicine

## 2017-10-31 LAB — TSH: TSH: 8.97 u[IU]/mL — AB (ref 0.35–4.50)

## 2017-10-31 MED ORDER — LEVOTHYROXINE SODIUM 150 MCG PO TABS: 150.0000 ug | ORAL_TABLET | Freq: Every day | ORAL | 1 refills | Status: DC

## 2017-12-08 ENCOUNTER — Other Ambulatory Visit: Payer: Self-pay | Admitting: Family Medicine

## 2017-12-08 DIAGNOSIS — E039 Hypothyroidism, unspecified: Secondary | ICD-10-CM

## 2017-12-09 ENCOUNTER — Other Ambulatory Visit (INDEPENDENT_AMBULATORY_CARE_PROVIDER_SITE_OTHER): Payer: Federal, State, Local not specified - PPO

## 2017-12-09 DIAGNOSIS — E039 Hypothyroidism, unspecified: Secondary | ICD-10-CM

## 2017-12-09 LAB — TSH: TSH: 0.4 u[IU]/mL (ref 0.35–4.50)

## 2017-12-10 ENCOUNTER — Other Ambulatory Visit: Payer: Self-pay

## 2017-12-10 DIAGNOSIS — E039 Hypothyroidism, unspecified: Secondary | ICD-10-CM

## 2017-12-11 ENCOUNTER — Other Ambulatory Visit: Payer: Self-pay | Admitting: Family Medicine

## 2017-12-11 MED ORDER — LEVOTHYROXINE SODIUM 150 MCG PO TABS: 150.0000 ug | ORAL_TABLET | Freq: Every day | ORAL | 1 refills | Status: DC

## 2017-12-11 NOTE — Telephone Encounter (Signed)
Pharmacy sent RF request for levothyroxine 178mcg, but per lab result note 12.27.18 patient is to be on levothyroxine 137mcg.   Rx sent for correct 163mcg dose x 1 rf.

## 2017-12-15 ENCOUNTER — Ambulatory Visit (INDEPENDENT_AMBULATORY_CARE_PROVIDER_SITE_OTHER): Payer: Federal, State, Local not specified - PPO

## 2017-12-15 DIAGNOSIS — Z23 Encounter for immunization: Secondary | ICD-10-CM

## 2017-12-15 NOTE — Progress Notes (Addendum)
Patient presents today for Shingrix vaccine. Given with no incidence or problems. Patient left with no complaints.  Medical screening examination/treatment/procedure(s) were performed by non-physician practitioner and as supervising physician I was immediately available for consultation/collaboration.  I agree with above assessment and plan.  Electronically Signed by: Howard Pouch, DO [facility] [place]

## 2017-12-27 ENCOUNTER — Other Ambulatory Visit: Payer: Self-pay | Admitting: Family Medicine

## 2018-01-20 ENCOUNTER — Other Ambulatory Visit: Payer: Self-pay | Admitting: Family Medicine

## 2018-01-28 ENCOUNTER — Other Ambulatory Visit: Payer: Self-pay | Admitting: Family Medicine

## 2018-02-02 ENCOUNTER — Other Ambulatory Visit (INDEPENDENT_AMBULATORY_CARE_PROVIDER_SITE_OTHER): Payer: Federal, State, Local not specified - PPO

## 2018-02-02 DIAGNOSIS — E039 Hypothyroidism, unspecified: Secondary | ICD-10-CM | POA: Diagnosis not present

## 2018-02-02 LAB — TSH: TSH: 0.57 u[IU]/mL (ref 0.35–4.50)

## 2018-02-03 ENCOUNTER — Encounter: Payer: Self-pay | Admitting: *Deleted

## 2018-02-03 ENCOUNTER — Other Ambulatory Visit: Payer: Self-pay | Admitting: *Deleted

## 2018-02-03 DIAGNOSIS — E039 Hypothyroidism, unspecified: Secondary | ICD-10-CM

## 2018-03-10 ENCOUNTER — Ambulatory Visit: Payer: Federal, State, Local not specified - PPO | Admitting: Family Medicine

## 2018-03-10 ENCOUNTER — Encounter: Payer: Self-pay | Admitting: Family Medicine

## 2018-03-10 VITALS — BP 115/68 | HR 68 | Temp 97.5°F | Resp 16 | Ht 65.0 in | Wt 169.5 lb

## 2018-03-10 DIAGNOSIS — E7800 Pure hypercholesterolemia, unspecified: Secondary | ICD-10-CM | POA: Diagnosis not present

## 2018-03-10 DIAGNOSIS — E663 Overweight: Secondary | ICD-10-CM | POA: Diagnosis not present

## 2018-03-10 DIAGNOSIS — E039 Hypothyroidism, unspecified: Secondary | ICD-10-CM | POA: Diagnosis not present

## 2018-03-10 DIAGNOSIS — K219 Gastro-esophageal reflux disease without esophagitis: Secondary | ICD-10-CM | POA: Diagnosis not present

## 2018-03-10 MED ORDER — LEVOTHYROXINE SODIUM 150 MCG PO TABS: 150.0000 ug | ORAL_TABLET | Freq: Every day | ORAL | 1 refills | Status: DC

## 2018-03-10 NOTE — Progress Notes (Signed)
OFFICE VISIT  03/10/2018   CC:  Chief Complaint  Patient presents with  . Follow-up    RCI, pt is fasting.   HPI:    Patient is a 84 y.o. Caucasian male who presents for 6 mo f/u HLD, hypothyroidism, and GERD.  Feeling well, walking 5 miles per day.  Has lost some wt.  Hypothyroidism: TSH mildly elevated 12/2017, dose increased, TSH normalized 12/09/17, showed stability 02/02/18. Plan repeat TSH 6 mo.  HLD: Lipid panel excellent 6 mo ago.  Taking statin as rx'd, no side effect.  GERD: currently taking pantoprazole daily.  On occasions when he has gone w/out this med he has not had significant return of GERD.  ROS: energy level is good, no constipation, no dry skin, no dizziness, no CP, no palpitations, no SOB, no abd pain, no melena, no myalgias/arthralgias.   Past Medical History:  Diagnosis Date  . Adenomatous colon polyp 2012; 01/2016   Bx 2012 showed tubular adenoma but no HG dysplasia.  2017 : adenomatous.  Recall 3 yrs  . Asymptomatic cholelithiasis 2017  . Erectile dysfunction   . Hearing loss of both ears    +hearing aids  . HTN (hypertension)   . Hypothyroidism   . Increased prostate specific antigen (PSA) velocity 11/11/2011   Biopsy negative 03/11/12.  Followed by Dr. Alinda Money (mild rise in PSA 2015, but Dr. Alinda Money and pt agreed to continue with annual PSA/DRE, no invasive procedure).  PSA 4.19 04/2017.  Dr. Alinda Money recommended no further PSA testing, just annual DRE.  Marland Kitchen Pleural effusion associated with pulmonary infection 04/2014   PULM: benign parapneumonic effusion; resolved 05/2014  . Pulmonary nodule 2015   with subcarinal adenopathy; stable on serial CT scans in 2015 and 09/2015, and 03/2016.  F/u CT 03/2017 stable---MAY BE CONSIDERED BENIGN.  NO FURTHER CT's needed.  . Stress fracture of foot 2017   right    Past Surgical History:  Procedure Laterality Date  . CARDIOVASCULAR STRESS TEST  07/06/14   NORMAL Myocardial perfusion imaging, normal EF, normal LV function, no  wall motion abnormality  . COLONOSCOPY W/ POLYPECTOMY  2012; 01/2016   2017 adenomatous polyp: recall 3 yrs  . KNEE SURGERY     2009, laperoscopic  . TONSILLECTOMY  1970  . [place]    Outpatient Medications Prior to Visit  Medication Sig Dispense Refill  . atorvastatin (LIPITOR) 10 MG tablet TAKE 1 TABLET BY MOUTH EVERY DAY 90 tablet 0  . calcium carbonate (CALCIUM 600) 600 MG TABS Take 600 mg by mouth daily. Reported on 02/07/2016    . ibuprofen (ADVIL,MOTRIN) 800 MG tablet Take 800 mg by mouth every 8 (eight) hours as needed for mild pain. Reported on 02/07/2016    . loratadine (CLARITIN) 10 MG tablet Take 10 mg by mouth daily. Reported on 02/07/2016    . Multiple Vitamins-Minerals (CENTRUM SILVER PO) Take 1 tablet by mouth daily. Reported on 02/07/2016    . niacin 500 MG tablet Take 500 mg by mouth daily with breakfast. Reported on 02/07/2016    . Omega-3 Fatty Acids (FISH OIL) 1000 MG CAPS Take 1 capsule by mouth daily. Reported on 02/07/2016    . pantoprazole (PROTONIX) 40 MG tablet Take 1 tablet (40 mg total) by mouth daily. 30 tablet 3  . Psyllium (METAMUCIL PO) Take 1 scoop by mouth daily. Reported on 02/07/2016    . levothyroxine (SYNTHROID, LEVOTHROID) 150 MCG tablet Take 1 tablet (150 mcg total) by mouth daily. [place]  tablet 1  . aspirin EC 81 MG tablet Take 81 mg by mouth daily. Reported on 02/07/2016    . levothyroxine (SYNTHROID, LEVOTHROID) 150 MCG tablet TAKE 1 TABLET BY MOUTH DAILY (Patient not taking: Reported on 03/10/2018) 30 tablet 0   No facility-administered medications prior to visit.     No Known Allergies  ROS As per HPI  PE: Blood pressure 115/68, pulse 68, temperature (!) 97.5 F (36.4 C), temperature source Oral, resp. rate 16, height 5\' 5"  (1.651 m), weight 169 lb 8 oz (76.9 kg), SpO2 98 %. Body mass index is 28.21 kg/m.  Gen: Alert, well appearing.  Patient is oriented to person, place, time, and situation. AFFECT: pleasant, lucid thought and  speech. CV: RRR, no m/r/g.   LUNGS: CTA bilat, nonlabored resps, good aeration in all lung fields. EXT: no clubbing, cyanosis, or edema.    LABS:  Lab Results  Component Value Date   TSH 0.57 02/02/2018   Lab Results  Component Value Date   WBC 5.2 10/17/2014   HGB 15.5 10/17/2014   HCT 46.3 10/17/2014   MCV 95.6 10/17/2014   PLT 199.0 10/17/2014   Lab Results  Component Value Date   CREATININE 1.02 09/12/2017   BUN 20 09/12/2017   NA 142 09/12/2017   K 3.9 09/12/2017   CL 106 09/12/2017   CO2 28 09/12/2017   Lab Results  Component Value Date   ALT 12 09/12/2017   AST 17 09/12/2017   ALKPHOS 58 09/12/2017   BILITOT 0.8 09/12/2017   Lab Results  Component Value Date   CHOL 143 09/12/2017   Lab Results  Component Value Date   HDL 46.90 09/12/2017   Lab Results  Component Value Date   LDLCALC 84 09/12/2017   Lab Results  Component Value Date   TRIG 60.0 09/12/2017   Lab Results  Component Value Date   CHOLHDL 3 09/12/2017   Lab Results  Component Value Date   PSA 3.94 11/11/2011   PSA 2.61 12/17/2010    IMPRESSION AND PLAN:  1) Hypothyroidism: The current medical regimen is effective;  continue present plan and medications. Recheck TSH in 6 mo at next CPE.  2) Hypercholesterolemia: tolerating statin.  Last lipid panel with excellent numbers 6 mo ago, hepatic panel normal 6 mo ago.  Plan repeat lipids in 6 mo.  3) GERD: doing well on daily PPI. Encouraged pt to try to slowly ween down on this med to see how he does regarding GERD sx's.  An After Visit Summary was printed and given to the patient.  FOLLOW UP: Return in about 6 months (around 09/10/2018) for annual CPE (fasting).  Signed:  Crissie Sickles, MD           03/10/2018

## 2018-03-19 DIAGNOSIS — K080 Exfoliation of teeth due to systemic causes: Secondary | ICD-10-CM | POA: Diagnosis not present

## 2018-03-31 ENCOUNTER — Other Ambulatory Visit: Payer: Self-pay | Admitting: Family Medicine

## 2018-04-23 ENCOUNTER — Other Ambulatory Visit: Payer: Self-pay | Admitting: Family Medicine

## 2018-04-24 NOTE — Telephone Encounter (Signed)
RF request for atorvastatin LOV: 03/10/18 Next ov: 09/10/18 Last written: 01/20/18 #90 w/ 0RF  Rx refilled for #90 w/ 1RF. [facility] [place].

## 2018-09-10 ENCOUNTER — Ambulatory Visit (INDEPENDENT_AMBULATORY_CARE_PROVIDER_SITE_OTHER): Payer: Federal, State, Local not specified - PPO | Admitting: Family Medicine

## 2018-09-10 ENCOUNTER — Encounter: Payer: Self-pay | Admitting: Family Medicine

## 2018-09-10 VITALS — BP 134/68 | HR 62 | Temp 97.5°F | Resp 16 | Ht 65.0 in | Wt 162.1 lb

## 2018-09-10 DIAGNOSIS — E663 Overweight: Secondary | ICD-10-CM

## 2018-09-10 DIAGNOSIS — Z23 Encounter for immunization: Secondary | ICD-10-CM | POA: Diagnosis not present

## 2018-09-10 DIAGNOSIS — E7800 Pure hypercholesterolemia, unspecified: Secondary | ICD-10-CM | POA: Diagnosis not present

## 2018-09-10 DIAGNOSIS — E039 Hypothyroidism, unspecified: Secondary | ICD-10-CM

## 2018-09-10 DIAGNOSIS — Z0000 Encounter for general adult medical examination without abnormal findings: Secondary | ICD-10-CM

## 2018-09-10 DIAGNOSIS — M67442 Ganglion, left hand: Secondary | ICD-10-CM

## 2018-09-10 LAB — LIPID PANEL
Cholesterol: 119 mg/dL (ref 0–200)
HDL: 44.3 mg/dL (ref >39.00)
LDL Cholesterol: 59 mg/dL (ref 0–99)
NONHDL: 75.03
Total CHOL/HDL Ratio: 3
Triglycerides: 78 mg/dL (ref 0.0–149.0)
VLDL: 15.6 mg/dL (ref 0.0–40.0)

## 2018-09-10 LAB — COMPREHENSIVE METABOLIC PANEL
ALBUMIN: 4.1 g/dL (ref 3.5–5.2)
ALK PHOS: 71 U/L (ref 39–117)
ALT: 11 U/L (ref 0–53)
AST: 16 U/L (ref 0–37)
BILIRUBIN TOTAL: 0.8 mg/dL (ref 0.2–1.2)
BUN: 17 mg/dL (ref 6–23)
CALCIUM: 9.2 mg/dL (ref 8.4–10.5)
CHLORIDE: 106 meq/L (ref 96–112)
CO2: 30 mEq/L (ref 19–32)
Creatinine, Ser: 1 mg/dL (ref 0.40–1.50)
GFR: 75.98 mL/min (ref >60.00)
GLUCOSE: 106 mg/dL — AB (ref 70–99)
POTASSIUM: 4.1 meq/L (ref 3.5–5.1)
Sodium: 143 mEq/L (ref 135–145)
Total Protein: 6.7 g/dL (ref 6.0–8.3)

## 2018-09-10 LAB — TSH: TSH: 0.02 u[IU]/mL — AB (ref 0.35–4.50)

## 2018-09-10 NOTE — Progress Notes (Signed)
Office Note 09/10/2018  CC:  Chief Complaint  Patient presents with  . Annual Exam    Pt is fasting.    HPI:  Joel Kelley is a 84 y.o. male who is here for annual health maintenance exam.  Exercise: walks 5-8 miles per day, has lost 18 lbs over last several months. Diet: healthy diet  L middle finger diffuse swelling x 1 yr, stiff, some occ pain over palmar aspect of 3rd MCP. No redness.  No recent trauma/injury.  Past Medical History:  Diagnosis Date  . Adenomatous colon polyp 2012; 01/2016   Bx 2012 showed tubular adenoma but no HG dysplasia.  2017 : adenomatous.  Recall 3 yrs  . Asymptomatic cholelithiasis 2017  . Erectile dysfunction   . Hearing loss of both ears    +hearing aids  . HTN (hypertension)   . Hypothyroidism   . Increased prostate specific antigen (PSA) velocity 11/11/2011   Biopsy negative 03/11/12.  Followed by Dr. Alinda Money (mild rise in PSA 2015, but Dr. Alinda Money and pt agreed to continue with annual PSA/DRE, no invasive procedure).  PSA 4.19 04/2017.  Dr. Alinda Money recommended no further PSA testing, just annual DRE.  Marland Kitchen Pleural effusion associated with pulmonary infection 04/2014   PULM: benign parapneumonic effusion; resolved 05/2014  . Pulmonary nodule 2015   with subcarinal adenopathy; stable on serial CT scans in 2015 and 09/2015, and 03/2016.  F/u CT 03/2017 stable---MAY BE CONSIDERED BENIGN.  NO FURTHER CT's needed.  . Stress fracture of foot 2017   right    Past Surgical History:  Procedure Laterality Date  . CARDIOVASCULAR STRESS TEST  07/06/14   NORMAL Myocardial perfusion imaging, normal EF, normal LV function, no wall motion abnormality  . COLONOSCOPY W/ POLYPECTOMY  2012; 01/2016   2017 adenomatous polyp: recall 3 yrs  . KNEE SURGERY     2009, laperoscopic  . TONSILLECTOMY  1970  . UMBILICAL HERNIA REPAIR  1980    Family History  Problem Relation Age of Onset  . Hypertension Mother   . Hypertension Father     Social History   Socioeconomic  History  . Marital status: Married    Spouse name: [name]  . Number of children: Not on file  . Years of education: Not on file  . Highest education level: Not on file  Occupational History  . Occupation: Retired [employer]  . Financial resource strain: Not on file  . Food insecurity:    Worry: Not on file    Inability: Not on file  . Transportation needs:    Medical: Not on file    Non-medical: Not on file  Tobacco Use  . Smoking status: Former Smoker    Packs/day: 0.25    Years: 40.00    Pack years: 10.00    Types: Cigarettes    Last attempt to quit: 11/04/1988    Years since quitting: 29.8  . Smokeless tobacco: Never Used  Substance and Sexual Activity  . Alcohol use: No    Comment: quit in 1990   . Drug use: No  . Sexual activity: Not on file  Lifestyle  . Physical activity:    Days per week: Not on file    Minutes per session: Not on file  . Stress: Not on file  Relationships  . Social connections:    Talks on phone: Not on file    Gets together: Not on file    Attends religious service: Not on file  Active member of club or organization: Not on file    Attends meetings of clubs or organizations: Not on file    Relationship status: Not on file  . Intimate partner violence:    Fear of current or ex partner: Not on file    Emotionally abused: Not on file    Physically abused: Not on file    Forced sexual activity: Not on file  Other Topics Concern  . Not on file  Social History Narrative   Divorced, remarried, one son and one step daughter (step-daughter is local), 2 grandchildren.   Relocated from [place] about 2010.   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   Distant tobacco abuse: avg 5 cigs day for 20 yrs on and off, quit for good around 1990.   No alcohol.   Daily walking at the park.    Outpatient Medications Prior to Visit  Medication Sig Dispense Refill  . atorvastatin (LIPITOR) 10 MG tablet TAKE 1 TABLET BY MOUTH EVERY DAY 90 tablet 1  .  calcium carbonate (CALCIUM 600) 600 MG TABS Take 600 mg by mouth daily. Reported on 02/07/2016    . ibuprofen (ADVIL,MOTRIN) 800 MG tablet Take 800 mg by mouth every 8 (eight) hours as needed for mild pain. Reported on 02/07/2016    . levothyroxine (SYNTHROID, LEVOTHROID) 150 MCG tablet Take 1 tablet (150 mcg total) by mouth daily. 90 tablet 1  . loratadine (CLARITIN) 10 MG tablet Take 10 mg by mouth daily. Reported on 02/07/2016    . Multiple Vitamins-Minerals (CENTRUM SILVER PO) Take 1 tablet by mouth daily. Reported on 02/07/2016    . niacin 500 MG tablet Take 500 mg by mouth daily with breakfast. Reported on 02/07/2016    . Omega-3 Fatty Acids (FISH OIL) 1000 MG CAPS Take 1 capsule by mouth daily. Reported on 02/07/2016    . pantoprazole (PROTONIX) 40 MG tablet Take 1 tablet (40 mg total) by mouth daily. 30 tablet 3  . Psyllium (METAMUCIL PO) Take 1 scoop by mouth daily. Reported on 02/07/2016     No facility-administered medications prior to visit.     No Known Allergies  ROS Review of Systems  Constitutional: Negative for appetite change, chills, fatigue and fever.  HENT: Negative for congestion, dental problem, ear pain and sore throat.   Eyes: Negative for discharge, redness and visual disturbance.  Respiratory: Negative for cough, chest tightness, shortness of breath and wheezing.   Cardiovascular: Negative for chest pain, palpitations and leg swelling.  Gastrointestinal: Negative for abdominal pain, blood in stool, diarrhea, nausea and vomiting.  Genitourinary: Negative for difficulty urinating, dysuria, flank pain, frequency, hematuria and urgency.  Musculoskeletal: Negative for arthralgias, back pain, joint swelling, myalgias and neck stiffness.  Skin: Negative for pallor and rash.  Neurological: Negative for dizziness, speech difficulty, weakness and headaches.  Hematological: Negative for adenopathy. Does not bruise/bleed easily.  Psychiatric/Behavioral: Negative for confusion and sleep  disturbance. The patient is not nervous/anxious.     PE; Blood pressure 134/68, pulse 62, temperature (!) 97.5 F (36.4 C), temperature source Oral, resp. rate 16, height 5\' 5"  (1.651 m), weight 162 lb 2 oz (73.5 kg), SpO2 96 %. Gen: Alert, well appearing.  Patient is oriented to person, place, time, and situation. AFFECT: pleasant, lucid thought and speech. ENT: Ears: EACs clear, normal epithelium.  TMs with good light reflex and landmarks bilaterally.  Eyes: no injection, icteris, swelling, or exudate.  EOMI, PERRLA. Nose: no drainage or turbinate edema/swelling.  No injection or focal lesion.  Mouth: lips without lesion/swelling.  Oral mucosa pink and moist.  Dentition intact and without obvious caries or gingival swelling.  Oropharynx without erythema, exudate, or swelling.  Neck: supple/nontender.  No LAD, mass, or TM.  Carotid pulses 2+ bilaterally, without bruits. CV: RRR, no m/r/g.   LUNGS: CTA bilat, nonlabored resps, good aeration in all lung fields. ABD: soft, NT, ND, BS normal.  No hepatospenomegaly or mass.  No bruits. EXT: no clubbing, cyanosis, or edema.  Left hand middle finger with diffuse swelling of soft tissues of MCP, prox phalanx, and PIP, with stiffness upon flexion at MCP joint.  Palpable firm nodular lesion on flexor tendon--mid palm level, mild tenderness here and along the area of swelling.  No erythema or warmth. Musculoskeletal: no joint swelling, erythema, warmth, or tenderness.  ROM of all joints intact. Skin - no sores or suspicious lesions or rashes or color changes Rectal exam: negative without mass, lesions or tenderness, PROSTATE EXAM: smooth and symmetric without nodules or tenderness.   Pertinent labs:  Lab Results  Component Value Date   TSH 0.57 02/02/2018   Lab Results  Component Value Date   WBC 5.2 10/17/2014   HGB 15.5 10/17/2014   HCT 46.3 10/17/2014   MCV 95.6 10/17/2014   PLT 199.0 10/17/2014   Lab Results  Component Value Date    CREATININE 1.02 09/12/2017   BUN 20 09/12/2017   NA 142 09/12/2017   K 3.9 09/12/2017   CL 106 09/12/2017   CO2 28 09/12/2017   Lab Results  Component Value Date   ALT 12 09/12/2017   AST 17 09/12/2017   ALKPHOS 58 09/12/2017   BILITOT 0.8 09/12/2017   Lab Results  Component Value Date   CHOL 143 09/12/2017   Lab Results  Component Value Date   HDL 46.90 09/12/2017   Lab Results  Component Value Date   LDLCALC 84 09/12/2017   Lab Results  Component Value Date   TRIG 60.0 09/12/2017   Lab Results  Component Value Date   CHOLHDL 3 09/12/2017   Lab Results  Component Value Date   PSA 3.94 11/11/2011   PSA 2.61 12/17/2010     ASSESSMENT AND PLAN:   Health maintenance exam: Reviewed age and gender appropriate health maintenance issues (prudent diet, regular exercise, health risks of tobacco and excessive alcohol, use of seatbelts, fire alarms in home, use of sunscreen).  Also reviewed age and gender appropriate health screening as well as vaccine recommendations. Vaccines: Flu vaccine-->given today.    All others UTD. Labs: CMET, FLP Prostate ca screening: DRE annually per urol recommendations (no further PSAs recommended)-->DRE normal today. Colon ca screening:next colonoscopy due 01/2019.  Hypothyroidism: due for TSH monitoring today.  Left hand 3rd digit flexor tendon sheath cyst with inflammation/swelling: discussed dx with pt, options are watchful waiting approach vs orthopedic referral.  He chooses watchful waiting approach today. Signs/symptoms to call or return for were reviewed and pt expressed understanding.  An After Visit Summary was printed and given to the patient.  FOLLOW UP:  Return in about 6 months (around 03/11/2019) for routine chronic illness f/u.  Signed:  Crissie Sickles, MD           09/10/2018

## 2018-09-10 NOTE — Patient Instructions (Signed)

## 2018-09-11 ENCOUNTER — Other Ambulatory Visit: Payer: Self-pay

## 2018-09-11 DIAGNOSIS — E039 Hypothyroidism, unspecified: Secondary | ICD-10-CM

## 2018-09-24 DIAGNOSIS — K080 Exfoliation of teeth due to systemic causes: Secondary | ICD-10-CM | POA: Diagnosis not present

## 2018-09-30 ENCOUNTER — Other Ambulatory Visit: Payer: Self-pay | Admitting: Family Medicine

## 2018-09-30 NOTE — Telephone Encounter (Signed)
Requested Prescriptions  Pending Prescriptions Disp Refills  . levothyroxine (SYNTHROID, LEVOTHROID) 150 MCG tablet [Pharmacy Med Name: LEVOTHYROXINE 0.150MG  (150MCG) TAB] 90 tablet 0    Sig: TAKE 1 TABLET BY MOUTH EVERY DAY     Endocrinology:  Hypothyroid Agents Failed - 09/30/2018 10:26 AM      Failed - TSH needs to be rechecked within 3 months after an abnormal result. Refill until TSH is due.      Failed - TSH in normal range and within 360 days    TSH  Date Value Ref Range Status  09/10/2018 0.02 (L) 0.35 - 4.50 uIU/mL Final         Passed - Valid encounter within last 12 months    Recent Outpatient Visits          2 weeks ago Health maintenance examination   [place], Adrian Blackwater, MD   6 months ago Hypothyroidism, unspecified type   [place], Adrian Blackwater, MD   1 year ago Hypercholesterolemia   [place], Adrian Blackwater, MD   1 year ago Hypercholesterolemia   [place], Adrian Blackwater, MD   2 years ago Health maintenance examination   [facility] [place], Adrian Blackwater, MD      Future Appointments            In 5 months [place], Adrian Blackwater, MD [place], [facility]

## 2018-09-30 NOTE — Telephone Encounter (Signed)
Pt called back.  He only has 5 pills left.

## 2018-09-30 NOTE — Telephone Encounter (Signed)
LMOM for pt to CB to let us know if he needs refill now or if he can wait until after his lab appointment scheduled for 10/13/18.    Okay for PEC to discuss RX need.

## 2018-10-13 ENCOUNTER — Other Ambulatory Visit (INDEPENDENT_AMBULATORY_CARE_PROVIDER_SITE_OTHER): Payer: Federal, State, Local not specified - PPO

## 2018-10-13 DIAGNOSIS — E039 Hypothyroidism, unspecified: Secondary | ICD-10-CM

## 2018-10-13 LAB — TSH: TSH: 0.27 u[IU]/mL — AB (ref 0.35–4.50)

## 2018-10-14 ENCOUNTER — Other Ambulatory Visit: Payer: Self-pay

## 2018-10-14 DIAGNOSIS — E039 Hypothyroidism, unspecified: Secondary | ICD-10-CM

## 2018-10-24 ENCOUNTER — Other Ambulatory Visit: Payer: Self-pay | Admitting: Family Medicine

## 2018-11-04 ENCOUNTER — Other Ambulatory Visit: Payer: Self-pay | Admitting: Family Medicine

## 2018-11-13 DIAGNOSIS — H26493 Other secondary cataract, bilateral: Secondary | ICD-10-CM | POA: Diagnosis not present

## 2018-11-20 DIAGNOSIS — H2513 Age-related nuclear cataract, bilateral: Secondary | ICD-10-CM | POA: Diagnosis not present

## 2018-12-08 DIAGNOSIS — K080 Exfoliation of teeth due to systemic causes: Secondary | ICD-10-CM | POA: Diagnosis not present

## 2018-12-09 ENCOUNTER — Other Ambulatory Visit (INDEPENDENT_AMBULATORY_CARE_PROVIDER_SITE_OTHER): Payer: Federal, State, Local not specified - PPO

## 2018-12-09 DIAGNOSIS — E039 Hypothyroidism, unspecified: Secondary | ICD-10-CM | POA: Diagnosis not present

## 2018-12-09 LAB — TSH: TSH: 0.59 u[IU]/mL (ref 0.35–4.50)

## 2018-12-10 ENCOUNTER — Encounter: Payer: Self-pay | Admitting: *Deleted

## 2019-02-06 ENCOUNTER — Other Ambulatory Visit: Payer: Self-pay | Admitting: Family Medicine

## 2019-02-22 ENCOUNTER — Encounter: Payer: Self-pay | Admitting: Family Medicine

## 2019-03-05 DIAGNOSIS — K5790 Diverticulosis of intestine, part unspecified, without perforation or abscess without bleeding: Secondary | ICD-10-CM

## 2019-03-05 HISTORY — DX: Diverticulosis of intestine, part unspecified, without perforation or abscess without bleeding: K57.90

## 2019-03-11 ENCOUNTER — Other Ambulatory Visit: Payer: Self-pay

## 2019-03-11 ENCOUNTER — Encounter: Payer: Self-pay | Admitting: Family Medicine

## 2019-03-11 ENCOUNTER — Ambulatory Visit (INDEPENDENT_AMBULATORY_CARE_PROVIDER_SITE_OTHER): Payer: Federal, State, Local not specified - PPO | Admitting: Family Medicine

## 2019-03-11 ENCOUNTER — Ambulatory Visit: Payer: Federal, State, Local not specified - PPO | Admitting: Family Medicine

## 2019-03-11 VITALS — BP 126/64

## 2019-03-11 DIAGNOSIS — E7800 Pure hypercholesterolemia, unspecified: Secondary | ICD-10-CM | POA: Diagnosis not present

## 2019-03-11 DIAGNOSIS — K219 Gastro-esophageal reflux disease without esophagitis: Secondary | ICD-10-CM | POA: Diagnosis not present

## 2019-03-11 DIAGNOSIS — E039 Hypothyroidism, unspecified: Secondary | ICD-10-CM | POA: Diagnosis not present

## 2019-03-11 MED ORDER — LEVOTHYROXINE SODIUM 150 MCG PO TABS: 150.0000 ug | ORAL_TABLET | Freq: Every day | ORAL | 1 refills | Status: DC

## 2019-03-11 NOTE — Progress Notes (Signed)
Virtual Visit via Video Note  I connected with pt on 03/11/19 at 11:00 AM EDT by a video enabled telemedicine application and verified that I am speaking with the correct person using two identifiers.  Location patient: home Location provider:work or home office Persons participating in the virtual visit: patient, provider  I discussed the limitations of evaluation and management by telemedicine and the availability of in person appointments. The patient expressed understanding and agreed to proceed.  Telemedicine visit is a necessity given the COVID-19 restrictions in place at the current time.  HPI: 85 y/o WM being seen today for 6 mo f/u HLD, GERD, and hypothyroidism. Feeling very good.  Continues to lose wt purposefully.  He attributes this to lots of walking (5 miles per day). Diet has always been healthy.  Hypothyroidism: Nov 2019 his TSH was slightly low so I lowered his T4 dose some (take 1/2 of 150 mcg tab mondays and fridays, take whole 150 mcg tab on all other days).  Subsequent TSH checks have been back in normal range (10/2018 and 12/2018).  At some point he got back on his 1 tab qd every day regimen for unclear reasons, but states he feels great: has no hot/cold intol, no fatigue, no skin changes, no bowel changes, no foggy cognition or mood impairment.  No myalgias.  No peripheral edema.  HLD: taking statin daily w/out side effects.  Has always had a good diet low in fats and carbs, lots of fruits and vegetables.  GERD: takes pantoprazole only on prn basis, avg use is 1-2 days per week.  No dysphagia or cough or ST.  ROS: See pertinent positives and negatives per HPI.  Past Medical History:  Diagnosis Date  . Adenomatous colon polyp 2012; 01/2016;   Bx 2012 showed tubular adenoma but no HG dysplasia.  2017 : adenomatous.  Recall 3 yrs  . Asymptomatic cholelithiasis 2017  . Erectile dysfunction   . Hearing loss of both ears    +hearing aids  . HTN (hypertension)   .  Hypothyroidism   . Increased prostate specific antigen (PSA) velocity 11/11/2011   Biopsy negative 03/11/12.  Followed by Dr. Alinda Money (mild rise in PSA 2015, but Dr. Alinda Money and pt agreed to continue with annual PSA/DRE, no invasive procedure).  PSA 4.19 04/2017.  Dr. Alinda Money recommended no further PSA testing, just annual DRE.  Marland Kitchen Pleural effusion associated with pulmonary infection 04/2014   PULM: benign parapneumonic effusion; resolved 05/2014  . Pulmonary nodule 2015   with subcarinal adenopathy; stable on serial CT scans in 2015 and 09/2015, and 03/2016.  F/u CT 03/2017 stable---MAY BE CONSIDERED BENIGN.  NO FURTHER CT's needed.  . Stress fracture of foot 2017   right    Past Surgical History:  Procedure Laterality Date  . CARDIOVASCULAR STRESS TEST  07/06/14   NORMAL Myocardial perfusion imaging, normal EF, normal LV function, no wall motion abnormality  . COLONOSCOPY W/ POLYPECTOMY  2012; 01/2016   2017 adenomatous polyp: recall 3 yrs  . KNEE SURGERY     2009, laperoscopic  . TONSILLECTOMY  1970  . UMBILICAL HERNIA REPAIR  1980    Family History  Problem Relation Age of Onset  . Hypertension Mother   . Hypertension Father       Current Outpatient Medications:  .  atorvastatin (LIPITOR) 10 MG tablet, TAKE 1 TABLET BY MOUTH EVERY DAY, Disp: 90 tablet, Rfl: 1 .  calcium carbonate (CALCIUM 600) 600 MG TABS, Take 600 mg by mouth daily.  Reported on 02/07/2016, Disp: , Rfl:  .  ibuprofen (ADVIL,MOTRIN) 800 MG tablet, Take 800 mg by mouth every 8 (eight) hours as needed for mild pain. Reported on 02/07/2016, Disp: , Rfl:  .  levothyroxine (SYNTHROID, LEVOTHROID) 150 MCG tablet, TAKE 1 TABLET BY MOUTH EVERY DAY, Disp: 30 tablet, Rfl: 1 .  loratadine (CLARITIN) 10 MG tablet, Take 10 mg by mouth daily. Reported on 02/07/2016, Disp: , Rfl:  .  Multiple Vitamins-Minerals (CENTRUM SILVER PO), Take 1 tablet by mouth daily. Reported on 02/07/2016, Disp: , Rfl:  .  niacin 500 MG tablet, Take 500 mg by mouth  daily with breakfast. Reported on 02/07/2016, Disp: , Rfl:  .  Omega-3 Fatty Acids (FISH OIL) 1000 MG CAPS, Take 1 capsule by mouth daily. Reported on 02/07/2016, Disp: , Rfl:  .  pantoprazole (PROTONIX) 40 MG tablet, Take 1 tablet (40 mg total) by mouth daily., Disp: 30 tablet, Rfl: 3 .  Psyllium (METAMUCIL PO), Take 1 scoop by mouth daily. Reported on 02/07/2016, Disp: , Rfl:   EXAM:  VITALS per patient if applicable: BP 267/12 (BP Location: Left Arm, Patient Position: Sitting, Cuff Size: Normal)    GENERAL: alert, oriented, appears well and in no acute distress  HEENT: atraumatic, conjunttiva clear, no obvious abnormalities on inspection of external nose and ears  NECK: normal movements of the head and neck  LUNGS: on inspection no signs of respiratory distress, breathing rate appears normal, no obvious gross SOB, gasping or wheezing  CV: no obvious cyanosis  MS: moves all visible extremities without noticeable abnormality  PSYCH/NEURO: pleasant and cooperative, no obvious depression or anxiety, speech and thought processing grossly intact  LABS: none today  Lab Results  Component Value Date   TSH 0.59 12/09/2018   Lab Results  Component Value Date   WBC 5.2 10/17/2014   HGB 15.5 10/17/2014   HCT 46.3 10/17/2014   MCV 95.6 10/17/2014   PLT 199.0 10/17/2014   Lab Results  Component Value Date   CREATININE 1.00 09/10/2018   BUN 17 09/10/2018   NA 143 09/10/2018   K 4.1 09/10/2018   CL 106 09/10/2018   CO2 30 09/10/2018   Lab Results  Component Value Date   ALT 11 09/10/2018   AST 16 09/10/2018   ALKPHOS 71 09/10/2018   BILITOT 0.8 09/10/2018   Lab Results  Component Value Date   CHOL 119 09/10/2018   Lab Results  Component Value Date   HDL 44.30 09/10/2018   Lab Results  Component Value Date   LDLCALC 59 09/10/2018   Lab Results  Component Value Date   TRIG 78.0 09/10/2018   Lab Results  Component Value Date   CHOLHDL 3 09/10/2018   Lab Results   Component Value Date   PSA 3.94 11/11/2011   PSA 2.61 12/17/2010    ASSESSMENT AND PLAN:  Discussed the following assessment and plan:  1) Hypothyroidism: doing great. Ok to plan recheck TSH at next f/u in 6 mo. Will eRx 90d supply of 150 mcg tabs today: take 1 tab qd every day.  2) HLD: tolerating statin.  Lipids excellent and hepatic panel normal 09/2018. No changes today. Recheck labs 6 mo.  3) GERD: doing well with diet. Continue prn pantoprazole.    I discussed the assessment and treatment plan with the patient. The patient was provided an opportunity to ask questions and all were answered. The patient agreed with the plan and demonstrated an understanding of the instructions.  The patient was advised to call back or seek an in-person evaluation if the symptoms worsen or if the condition fails to improve as anticipated.  F/u: 6 mo cpe  Signed:  Crissie Sickles, MD           03/11/2019

## 2019-03-17 DIAGNOSIS — Z86010 Personal history of colonic polyps: Secondary | ICD-10-CM | POA: Diagnosis not present

## 2019-03-17 DIAGNOSIS — D126 Benign neoplasm of colon, unspecified: Secondary | ICD-10-CM | POA: Diagnosis not present

## 2019-03-17 DIAGNOSIS — K621 Rectal polyp: Secondary | ICD-10-CM | POA: Diagnosis not present

## 2019-03-17 DIAGNOSIS — D125 Benign neoplasm of sigmoid colon: Secondary | ICD-10-CM | POA: Diagnosis not present

## 2019-03-17 DIAGNOSIS — K635 Polyp of colon: Secondary | ICD-10-CM | POA: Diagnosis not present

## 2019-04-06 ENCOUNTER — Encounter: Payer: Self-pay | Admitting: Family Medicine

## 2019-04-21 ENCOUNTER — Other Ambulatory Visit: Payer: Self-pay | Admitting: Family Medicine

## 2019-04-21 NOTE — Telephone Encounter (Signed)
Relation to pt: self  Call back number:[phone] Pharmacy: [facility] [place] [phone] (Phone) [phone] (Fax)     Reason for call:  Patient requesting atorvastatin (LIPITOR) 10 MG tablet refill

## 2019-04-22 MED ORDER — ATORVASTATIN CALCIUM 10 MG PO TABS: 10.0000 mg | ORAL_TABLET | Freq: Every day | ORAL | 1 refills | Status: DC

## 2019-04-22 NOTE — Telephone Encounter (Signed)
RX sent

## 2019-07-29 ENCOUNTER — Ambulatory Visit (INDEPENDENT_AMBULATORY_CARE_PROVIDER_SITE_OTHER): Payer: Federal, State, Local not specified - PPO

## 2019-07-29 ENCOUNTER — Other Ambulatory Visit: Payer: Self-pay

## 2019-07-29 DIAGNOSIS — Z23 Encounter for immunization: Secondary | ICD-10-CM | POA: Diagnosis not present

## 2019-09-13 ENCOUNTER — Encounter: Payer: Self-pay | Admitting: Family Medicine

## 2019-09-13 ENCOUNTER — Ambulatory Visit (INDEPENDENT_AMBULATORY_CARE_PROVIDER_SITE_OTHER): Payer: Federal, State, Local not specified - PPO | Admitting: Family Medicine

## 2019-09-13 ENCOUNTER — Other Ambulatory Visit: Payer: Self-pay

## 2019-09-13 VITALS — BP 129/71 | HR 70 | Temp 97.6°F | Resp 16 | Ht 65.0 in | Wt 163.6 lb

## 2019-09-13 DIAGNOSIS — Z1211 Encounter for screening for malignant neoplasm of colon: Secondary | ICD-10-CM

## 2019-09-13 DIAGNOSIS — E039 Hypothyroidism, unspecified: Secondary | ICD-10-CM | POA: Diagnosis not present

## 2019-09-13 DIAGNOSIS — Z125 Encounter for screening for malignant neoplasm of prostate: Secondary | ICD-10-CM | POA: Diagnosis not present

## 2019-09-13 DIAGNOSIS — E7800 Pure hypercholesterolemia, unspecified: Secondary | ICD-10-CM | POA: Diagnosis not present

## 2019-09-13 DIAGNOSIS — Z0000 Encounter for general adult medical examination without abnormal findings: Secondary | ICD-10-CM

## 2019-09-13 LAB — COMPREHENSIVE METABOLIC PANEL
ALT: 13 U/L (ref 0–53)
AST: 17 U/L (ref 0–37)
Albumin: 4.2 g/dL (ref 3.5–5.2)
Alkaline Phosphatase: 76 U/L (ref 39–117)
BUN: 17 mg/dL (ref 6–23)
CO2: 29 mEq/L (ref 19–32)
Calcium: 9.2 mg/dL (ref 8.4–10.5)
Chloride: 106 mEq/L (ref 96–112)
Creatinine, Ser: 1 mg/dL (ref 0.40–1.50)
GFR: 71.31 mL/min (ref >60.00)
Glucose, Bld: 99 mg/dL (ref 70–99)
Potassium: 4.7 mEq/L (ref 3.5–5.1)
Sodium: 142 mEq/L (ref 135–145)
Total Bilirubin: 0.9 mg/dL (ref 0.2–1.2)
Total Protein: 6.6 g/dL (ref 6.0–8.3)

## 2019-09-13 LAB — CBC WITH DIFFERENTIAL/PLATELET
Basophils Absolute: 0.1 10*3/uL (ref 0.0–0.1)
Basophils Relative: 1.2 % (ref 0.0–3.0)
Eosinophils Absolute: 0.2 10*3/uL (ref 0.0–0.7)
Eosinophils Relative: 3.2 % (ref 0.0–5.0)
HCT: 48 % (ref 39.0–52.0)
Hemoglobin: 16.1 g/dL (ref 13.0–17.0)
Lymphocytes Relative: 22.8 % (ref 12.0–46.0)
Lymphs Abs: 1.5 10*3/uL (ref 0.7–4.0)
MCHC: 33.5 g/dL (ref 30.0–36.0)
MCV: 95.7 fl (ref 78.0–100.0)
Monocytes Absolute: 0.6 10*3/uL (ref 0.1–1.0)
Monocytes Relative: 9.4 % (ref 3.0–12.0)
Neutro Abs: 4.1 10*3/uL (ref 1.4–7.7)
Neutrophils Relative %: 63.4 % (ref 43.0–77.0)
Platelets: 195 10*3/uL (ref 150.0–400.0)
RBC: 5.01 Mil/uL (ref 4.22–5.81)
RDW: 14.1 % (ref 11.5–15.5)
WBC: 6.4 10*3/uL (ref 4.0–10.5)

## 2019-09-13 LAB — LIPID PANEL
Cholesterol: 132 mg/dL (ref 0–200)
HDL: 48.8 mg/dL (ref >39.00)
LDL Cholesterol: 65 mg/dL (ref 0–99)
NonHDL: 83.11
Total CHOL/HDL Ratio: 3
Triglycerides: 91 mg/dL (ref 0.0–149.0)
VLDL: 18.2 mg/dL (ref 0.0–40.0)

## 2019-09-13 LAB — TSH: TSH: 0.02 u[IU]/mL — ABNORMAL LOW (ref 0.35–4.50)

## 2019-09-13 NOTE — Progress Notes (Signed)
Office Note 09/13/2019  CC:  Chief Complaint  Patient presents with  . Annual Exam    pt is fasting    HPI:  Joel Kelley is a 85 y.o. White male who is here for annual health maintenance exam. Doing very well.  Exercise: walking 3-5 miles daily. Diet: good/healthy, good portion control.   Past Medical History:  Diagnosis Date  . Adenomatous colon polyp 2012; 01/2016; 03/2019   Bx 2012 showed tubular adenoma but no HG dysplasia.  2017 : adenomatous.  03/2019: adenomatous->Recall 3 yrs vs no repeat (patient's choice per GI).  . Asymptomatic cholelithiasis 2017  . Diverticulosis 03/2019   Sigmoid (on colonoscopy)  . Erectile dysfunction   . Hearing loss of both ears    +hearing aids  . HTN (hypertension)   . Hypothyroidism   . Increased prostate specific antigen (PSA) velocity 11/11/2011   Biopsy negative 03/11/12.  Followed by Dr. Alinda Money (mild rise in PSA 2015, but Dr. Alinda Money and pt agreed to continue with annual PSA/DRE, no invasive procedure).  PSA 4.19 04/2017.  Dr. Alinda Money recommended no further PSA testing, just annual DRE.  Marland Kitchen Pleural effusion associated with pulmonary infection 04/2014   PULM: benign parapneumonic effusion; resolved 05/2014  . Pulmonary nodule 2015   with subcarinal adenopathy; stable on serial CT scans in 2015 and 09/2015, and 03/2016.  F/u CT 03/2017 stable---MAY BE CONSIDERED BENIGN.  NO FURTHER CT's needed.  . Stress fracture of foot 2017   right    Past Surgical History:  Procedure Laterality Date  . CARDIOVASCULAR STRESS TEST  07/06/14   NORMAL Myocardial perfusion imaging, normal EF, normal LV function, no wall motion abnormality  . COLONOSCOPY W/ POLYPECTOMY  2012; 01/2016; 03/2019   2017 adenomatous polyp.  Rpt 03/2019->adenomatous polyp->rpt 3 yrs OR no further due to aging out->pt's choice per GI  . KNEE SURGERY     2009, laperoscopic  . TONSILLECTOMY  1970  . UMBILICAL HERNIA REPAIR  1980    Family History  Problem Relation Age of Onset  .  Hypertension Mother   . Hypertension Father     Social History   Socioeconomic History  . Marital status: Married    Spouse name: [name]  . Number of children: Not on file  . Years of education: Not on file  . Highest education level: Not on file  Occupational History  . Occupation: Retired [employer]  . Financial resource strain: Not on file  . Food insecurity    Worry: Not on file    Inability: Not on file  . Transportation needs    Medical: Not on file    Non-medical: Not on file  Tobacco Use  . Smoking status: Former Smoker    Packs/day: 0.25    Years: 40.00    Pack years: 10.00    Types: Cigarettes    Quit date: 11/04/1988    Years since quitting: 30.8  . Smokeless tobacco: Never Used  Substance and Sexual Activity  . Alcohol use: No    Comment: quit in 1990   . Drug use: No  . Sexual activity: Not on file  Lifestyle  . Physical activity    Days per week: Not on file    Minutes per session: Not on file  . Stress: Not on file  Relationships  . Social Herbalist on phone: Not on file    Gets together: Not on file    Attends religious service: Not  on file    Active member of club or organization: Not on file    Attends meetings of clubs or organizations: Not on file    Relationship status: Not on file  . Intimate partner violence    Fear of current or ex partner: Not on file    Emotionally abused: Not on file    Physically abused: Not on file    Forced sexual activity: Not on file  Other Topics Concern  . Not on file  Social History Narrative   Divorced, remarried, one son and one step daughter (step-daughter is local), 2 grandchildren.   Relocated from [place] about 2010.   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   Distant tobacco abuse: avg 5 cigs day for 20 yrs on and off, quit for good around 1990.   No alcohol.   Daily walking at the park.    Outpatient Medications Prior to Visit  Medication Sig Dispense Refill  . atorvastatin  (LIPITOR) 10 MG tablet Take 1 tablet (10 mg total) by mouth daily. 90 tablet 1  . calcium carbonate (CALCIUM 600) 600 MG TABS Take 600 mg by mouth daily. Reported on 02/07/2016    . ibuprofen (ADVIL,MOTRIN) 800 MG tablet Take 800 mg by mouth every 8 (eight) hours as needed for mild pain. Reported on 02/07/2016    . levothyroxine (SYNTHROID) 150 MCG tablet Take 1 tablet (150 mcg total) by mouth daily. 90 tablet 1  . loratadine (CLARITIN) 10 MG tablet Take 10 mg by mouth daily. Reported on 02/07/2016    . Multiple Vitamins-Minerals (CENTRUM SILVER PO) Take 1 tablet by mouth daily. Reported on 02/07/2016    . niacin 500 MG tablet Take 500 mg by mouth daily with breakfast. Reported on 02/07/2016    . Omega-3 Fatty Acids (FISH OIL) 1000 MG CAPS Take 1 capsule by mouth daily. Reported on 02/07/2016    . pantoprazole (PROTONIX) 40 MG tablet Take 1 tablet (40 mg total) by mouth daily. 30 tablet 3  . Psyllium (METAMUCIL PO) Take 1 scoop by mouth daily. Reported on 02/07/2016     No facility-administered medications prior to visit.     No Known Allergies  ROS Review of Systems  PE; Blood pressure 129/71, pulse 70, temperature 97.6 F (36.4 C), temperature source Temporal, resp. rate 16, height 5\' 5"  (1.651 m), weight 163 lb 9.6 oz (74.2 kg), SpO2 96 %. Body mass index is 27.22 kg/m.  Gen: Alert, well appearing.  Patient is oriented to person, place, time, and situation. AFFECT: pleasant, lucid thought and speech. ENT: Ears: EACs clear, normal epithelium.  TMs with good light reflex and landmarks bilaterally.  Eyes: no injection, icteris, swelling, or exudate.  EOMI, PERRLA. Nose: no drainage or turbinate edema/swelling.  No injection or focal lesion.  Mouth: lips without lesion/swelling.  Oral mucosa pink and moist.  Dentition intact and without obvious caries or gingival swelling.  Oropharynx without erythema, exudate, or swelling.  Neck: supple/nontender.  No LAD, mass, or TM.  Carotid pulses 2+ bilaterally,  without bruits. CV: RRR, no m/r/g.   LUNGS: CTA bilat, nonlabored resps, good aeration in all lung fields. ABD: soft, NT, ND, BS normal.  No hepatospenomegaly or mass.  No bruits. EXT: no clubbing, cyanosis, or edema.  Musculoskeletal: no joint swelling, erythema, warmth, or tenderness.  ROM of all joints intact. Skin - no sores or suspicious lesions or rashes or color changes Rectal exam: negative without mass, lesions or tenderness, PROSTATE EXAM: smooth and symmetric without nodules or  tenderness.   Pertinent labs:  Lab Results  Component Value Date   TSH 0.59 12/09/2018   Lab Results  Component Value Date   WBC 5.2 10/17/2014   HGB 15.5 10/17/2014   HCT 46.3 10/17/2014   MCV 95.6 10/17/2014   PLT 199.0 10/17/2014   Lab Results  Component Value Date   CREATININE 1.00 09/10/2018   BUN 17 09/10/2018   NA 143 09/10/2018   K 4.1 09/10/2018   CL 106 09/10/2018   CO2 30 09/10/2018   Lab Results  Component Value Date   ALT 11 09/10/2018   AST 16 09/10/2018   ALKPHOS 71 09/10/2018   BILITOT 0.8 09/10/2018   Lab Results  Component Value Date   CHOL 119 09/10/2018   Lab Results  Component Value Date   HDL 44.30 09/10/2018   Lab Results  Component Value Date   LDLCALC 59 09/10/2018   Lab Results  Component Value Date   TRIG 78.0 09/10/2018   Lab Results  Component Value Date   CHOLHDL 3 09/10/2018   Lab Results  Component Value Date   PSA 3.94 11/11/2011   PSA 2.61 12/17/2010    ASSESSMENT AND PLAN:   Health maintenance exam: Reviewed age and gender appropriate health maintenance issues (prudent diet, regular exercise, health risks of tobacco and excessive alcohol, use of seatbelts, fire alarms in home, use of sunscreen).  Also reviewed age and gender appropriate health screening as well as vaccine recommendations. Vaccines: ALL UTD. Labs: TSH, CBC, CMET, FLP. Prostate ca screening: as per urologist rec's, DRE done but no further PSAs recommended. Colon  ca screening: recall 03/2022 if pt desires repeat at that time.  An After Visit Summary was printed and given to the patient.  FOLLOW UP:  Return in about 6 months (around 03/12/2020) for routine chronic illness f/u.  Signed:  Crissie Sickles, MD           09/13/2019

## 2019-09-13 NOTE — Patient Instructions (Signed)

## 2019-09-14 ENCOUNTER — Other Ambulatory Visit: Payer: Self-pay

## 2019-09-14 DIAGNOSIS — E039 Hypothyroidism, unspecified: Secondary | ICD-10-CM

## 2019-10-04 ENCOUNTER — Other Ambulatory Visit: Payer: Self-pay | Admitting: Family Medicine

## 2019-10-04 DIAGNOSIS — K080 Exfoliation of teeth due to systemic causes: Secondary | ICD-10-CM | POA: Diagnosis not present

## 2019-10-26 ENCOUNTER — Other Ambulatory Visit: Payer: Self-pay

## 2019-10-26 ENCOUNTER — Ambulatory Visit (INDEPENDENT_AMBULATORY_CARE_PROVIDER_SITE_OTHER): Payer: Federal, State, Local not specified - PPO | Admitting: Family Medicine

## 2019-10-26 DIAGNOSIS — E039 Hypothyroidism, unspecified: Secondary | ICD-10-CM

## 2019-10-26 LAB — TSH: TSH: 0.28 u[IU]/mL — ABNORMAL LOW (ref 0.35–4.50)

## 2019-10-27 ENCOUNTER — Other Ambulatory Visit: Payer: Self-pay

## 2019-10-27 DIAGNOSIS — E039 Hypothyroidism, unspecified: Secondary | ICD-10-CM

## 2019-11-20 ENCOUNTER — Ambulatory Visit: Payer: Federal, State, Local not specified - PPO | Attending: Internal Medicine

## 2019-11-20 DIAGNOSIS — Z23 Encounter for immunization: Secondary | ICD-10-CM | POA: Diagnosis not present

## 2019-11-20 NOTE — Progress Notes (Signed)
   Covid-19 Vaccination Clinic  Name:  Joel Kelley    MRN: [id number] DOB: 11-Jul-1934  11/20/2019  [name] was observed post Covid-19 immunization for 15 minutes without incidence. He was provided with Vaccine Information Sheet and instruction to access the V-Safe system.   [name] was instructed to call 911 with any severe reactions post vaccine: Marland Kitchen Difficulty breathing  . Swelling of your face and throat  . A fast heartbeat  . A bad rash all over your body  . Dizziness and weakness    Immunizations Administered    Name Date Dose VIS Date Route   Pfizer COVID-19 Vaccine 11/20/2019  1:05 PM 0.3 mL 10/15/2019 Intramuscular   Manufacturer: [employer]   Lot: [id number]   [place]: [id number]

## 2019-12-08 ENCOUNTER — Ambulatory Visit: Payer: Self-pay

## 2019-12-08 ENCOUNTER — Ambulatory Visit (INDEPENDENT_AMBULATORY_CARE_PROVIDER_SITE_OTHER): Payer: Federal, State, Local not specified - PPO | Admitting: Family Medicine

## 2019-12-08 ENCOUNTER — Other Ambulatory Visit: Payer: Self-pay

## 2019-12-08 ENCOUNTER — Ambulatory Visit: Payer: Federal, State, Local not specified - PPO | Attending: Internal Medicine

## 2019-12-08 DIAGNOSIS — Z23 Encounter for immunization: Secondary | ICD-10-CM | POA: Insufficient documentation

## 2019-12-08 DIAGNOSIS — E039 Hypothyroidism, unspecified: Secondary | ICD-10-CM | POA: Diagnosis not present

## 2019-12-08 LAB — TSH: TSH: 0.49 u[IU]/mL (ref 0.35–4.50)

## 2019-12-08 NOTE — Progress Notes (Signed)
COVID-19 Vaccine Information can be found at: [email or web address] For questions related to vaccine distribution or appointments, please email [email or web address] or call [phone].     Covid-19 Vaccination Clinic  Name:  Joel Kelley    MRN: [id number] DOB: 05-Jun-1934  12/08/2019  [name] was observed post Covid-19 immunization for 15 minutes without incidence. He was provided with Vaccine Information Sheet and instruction to access the V-Safe system.   [name] was instructed to call 911 with any severe reactions post vaccine: Marland Kitchen Difficulty breathing  . Swelling of your face and throat  . A fast heartbeat  . A bad rash all over your body  . Dizziness and weakness    Immunizations Administered    Name Date Dose VIS Date Route   Pfizer COVID-19 Vaccine 12/08/2019 10:26 AM 0.3 mL 10/15/2019 Intramuscular   Manufacturer: [place]   Lot: [id number]   [place]: [id number]

## 2020-02-20 DIAGNOSIS — T161XXA Foreign body in right ear, initial encounter: Secondary | ICD-10-CM | POA: Diagnosis not present

## 2020-02-22 DIAGNOSIS — T161XXA Foreign body in right ear, initial encounter: Secondary | ICD-10-CM | POA: Diagnosis not present

## 2020-03-10 ENCOUNTER — Encounter: Payer: Self-pay | Admitting: Family Medicine

## 2020-03-10 ENCOUNTER — Other Ambulatory Visit: Payer: Self-pay

## 2020-03-10 ENCOUNTER — Ambulatory Visit: Payer: Federal, State, Local not specified - PPO | Admitting: Family Medicine

## 2020-03-10 VITALS — BP 140/80 | HR 67 | Temp 97.7°F | Resp 18 | Ht 65.0 in | Wt 165.6 lb

## 2020-03-10 DIAGNOSIS — E039 Hypothyroidism, unspecified: Secondary | ICD-10-CM | POA: Diagnosis not present

## 2020-03-10 DIAGNOSIS — M65332 Trigger finger, left middle finger: Secondary | ICD-10-CM

## 2020-03-10 DIAGNOSIS — M659 Synovitis and tenosynovitis, unspecified: Secondary | ICD-10-CM | POA: Diagnosis not present

## 2020-03-10 DIAGNOSIS — M67442 Ganglion, left hand: Secondary | ICD-10-CM | POA: Diagnosis not present

## 2020-03-10 DIAGNOSIS — E7800 Pure hypercholesterolemia, unspecified: Secondary | ICD-10-CM

## 2020-03-10 LAB — TSH: TSH: 1.78 u[IU]/mL (ref 0.35–4.50)

## 2020-03-10 NOTE — Progress Notes (Signed)
OFFICE VISIT  03/10/2020   CC:  Chief Complaint  Patient presents with  . Hypothyroidism    Pt states fasting.   . Follow-up   HPI:    Patient is a 86 y.o. Caucasian male who presents for 6 mo f/u HLD and hypothyroidism.  Hypoth: TSH has been a little low so we have been decreasing his dose some.  Most recent TSH was 0.49 about 3 mo ago, at which time his T4 dose  Was 1/2 of 150 mcg tab on m/w/f and 1 tab all other days. Taking dose correctly, feels fine.  HLD: no probs with atorva. Still walking daily and eating healthy.  Home bp checks weekly show 130/80 or better.  Complains of several months hx of periods of pain in L middle finger, waxes and wanes, worse when tries to extend or flex finger.  Cannot close that finger when makes a fist.  No redness of finger, no fevers or malaise.  No preceding trauma.  ROS: no fevers, no CP, no SOB, no wheezing, no cough, no dizziness, no HAs, no rashes, no melena/hematochezia.  No polyuria or polydipsia.  No myalgias or arthralgias.  No focal weakness, paresthesias, or tremors.  No acute vision or hearing abnormalities. No n/v/d or abd pain.  No palpitations.     Past Medical History:  Diagnosis Date  . Adenomatous colon polyp 2012; 01/2016; 03/2019   Bx 2012 showed tubular adenoma but no HG dysplasia.  2017 : adenomatous.  03/2019: adenomatous->Recall 3 yrs vs no repeat (patient's choice per GI).  . Asymptomatic cholelithiasis 2017  . Diverticulosis 03/2019   Sigmoid (on colonoscopy)  . Erectile dysfunction   . Hearing loss of both ears    +hearing aids  . HTN (hypertension)   . Hypothyroidism   . Increased prostate specific antigen (PSA) velocity 11/11/2011   Biopsy negative 03/11/12.  Followed by Dr. Alinda Money (mild rise in PSA 2015, but Dr. Alinda Money and pt agreed to continue with annual PSA/DRE, no invasive procedure).  PSA 4.19 04/2017.  Dr. Alinda Money recommended no further PSA testing, just annual DRE.  Marland Kitchen Pleural effusion associated with  pulmonary infection 04/2014   PULM: benign parapneumonic effusion; resolved 05/2014  . Pulmonary nodule 2015   with subcarinal adenopathy; stable on serial CT scans in 2015 and 09/2015, and 03/2016.  F/u CT 03/2017 stable---MAY BE CONSIDERED BENIGN.  NO FURTHER CT's needed.  . Stress fracture of foot 2017   right    Past Surgical History:  Procedure Laterality Date  . CARDIOVASCULAR STRESS TEST  07/06/14   NORMAL Myocardial perfusion imaging, normal EF, normal LV function, no wall motion abnormality  . COLONOSCOPY W/ POLYPECTOMY  2012; 01/2016; 03/2019   2017 adenomatous polyp.  Rpt 03/2019->adenomatous polyp->rpt 3 yrs OR no further due to aging out->pt's choice per GI  . KNEE SURGERY     2009, laperoscopic  . TONSILLECTOMY  1970  . [place]    Outpatient Medications Prior to Visit  Medication Sig Dispense Refill  . atorvastatin (LIPITOR) 10 MG tablet TAKE 1 TABLET BY MOUTH EVERY DAY 90 tablet 1  . calcium carbonate (CALCIUM 600) 600 MG TABS Take 600 mg by mouth daily. Reported on 02/07/2016    . ibuprofen (ADVIL,MOTRIN) 800 MG tablet Take 800 mg by mouth every 8 (eight) hours as needed for mild pain. Reported on 02/07/2016    . levothyroxine (SYNTHROID) 150 MCG tablet TAKE 1 TABLET BY MOUTH DAILY 90 tablet 1  . loratadine (  CLARITIN) 10 MG tablet Take 10 mg by mouth daily. Reported on 02/07/2016    . Multiple Vitamins-Minerals (CENTRUM SILVER PO) Take 1 tablet by mouth daily. Reported on 02/07/2016    . niacin 500 MG tablet Take 500 mg by mouth daily with breakfast. Reported on 02/07/2016    . Omega-3 Fatty Acids (FISH OIL) 1000 MG CAPS Take 1 capsule by mouth daily. Reported on 02/07/2016    . pantoprazole (PROTONIX) 40 MG tablet Take 1 tablet (40 mg total) by mouth daily. 30 tablet 3  . Psyllium (METAMUCIL PO) Take 1 scoop by mouth daily. Reported on 02/07/2016     No facility-administered medications prior to visit.    No Known Allergies  ROS As per HPI  PE: Vitals with  BMI 03/10/2020 09/13/2019 03/11/2019  Height 5\' 5"  5\' 5"  -  Weight 165 lbs 10 oz 163 lbs 10 oz -  BMI [id number] -  Systolic [id number] [id number]  Diastolic 80 71 64  Pulse 67 70 -    Gen: Alert, well appearing.  Patient is oriented to person, place, time, and situation. AFFECT: pleasant, lucid thought and speech. CV: RRR, no m/r/g.   LUNGS: CTA bilat, nonlabored resps, good aeration in all lung fields. EXT: no clubbing or cyanosis.  No edema.  Left hand: middle finger with mild diffuse swelling mainly proximally.  Pink and warm. Mild TTP from about mid metacarpal (3rd) to DIP jt of that finger.  Active and passive extension gets to 170 deg, active and passive flexion is about 50% of normal.  No catching/locking of joint. He does have firm/tight feeling to his flexor tendon over 3rd digit.  ?palpable nodule?  LABS:    Chemistry      Component Value Date/Time   NA 142 09/13/2019 0914   K 4.7 09/13/2019 0914   CL 106 09/13/2019 0914   CO2 29 09/13/2019 0914   BUN 17 09/13/2019 0914   CREATININE 1.00 09/13/2019 0914      Component Value Date/Time   CALCIUM 9.2 09/13/2019 0914   ALKPHOS 76 09/13/2019 0914   AST 17 09/13/2019 0914   ALT 13 09/13/2019 0914   BILITOT 0.9 09/13/2019 0914     Lab Results  Component Value Date   CHOL 132 09/13/2019   HDL 48.80 09/13/2019   LDLCALC 65 09/13/2019   TRIG 91.0 09/13/2019   CHOLHDL 3 09/13/2019   Lab Results  Component Value Date   TSH 0.49 12/08/2019   Lab Results  Component Value Date   WBC 6.4 09/13/2019   HGB 16.1 09/13/2019   HCT 48.0 09/13/2019   MCV 95.7 09/13/2019   PLT 195.0 09/13/2019     IMPRESSION AND PLAN:  1) Hypothyroidism: monitor TSH again today. If normal then we can resume q16mo to annual monitoring.  2) HLD: tolerating statin. LDL 65 and hepatic panel normal 6 mo ago. Plan repeat 6 mo.  3) Left hand middle finger pain: suspect flexor tenosynovitis/flexor tendon sheath cyst. Refer to ortho hand specialist  for further eval.  An After Visit Summary was printed and given to the patient.  FOLLOW UP: Return in about 6 months (around 09/10/2020) for annual CPE (fasting).  Signed:  Crissie Sickles, MD           03/10/2020

## 2020-03-28 DIAGNOSIS — M65332 Trigger finger, left middle finger: Secondary | ICD-10-CM | POA: Diagnosis not present

## 2020-03-29 HISTORY — PX: OTHER SURGICAL HISTORY: SHX169

## 2020-04-01 ENCOUNTER — Other Ambulatory Visit: Payer: Self-pay | Admitting: Family Medicine

## 2020-05-16 DIAGNOSIS — M65332 Trigger finger, left middle finger: Secondary | ICD-10-CM | POA: Diagnosis not present

## 2020-07-20 ENCOUNTER — Other Ambulatory Visit: Payer: Self-pay

## 2020-07-20 ENCOUNTER — Ambulatory Visit (INDEPENDENT_AMBULATORY_CARE_PROVIDER_SITE_OTHER): Payer: Federal, State, Local not specified - PPO | Admitting: Family Medicine

## 2020-07-20 ENCOUNTER — Encounter: Payer: Self-pay | Admitting: Family Medicine

## 2020-07-20 DIAGNOSIS — Z23 Encounter for immunization: Secondary | ICD-10-CM | POA: Diagnosis not present

## 2020-07-24 ENCOUNTER — Encounter: Payer: Self-pay | Admitting: Family Medicine

## 2020-09-06 ENCOUNTER — Ambulatory Visit: Payer: Self-pay | Attending: Internal Medicine

## 2020-09-06 ENCOUNTER — Other Ambulatory Visit (HOSPITAL_COMMUNITY): Payer: Self-pay | Admitting: Internal Medicine

## 2020-09-06 DIAGNOSIS — Z23 Encounter for immunization: Secondary | ICD-10-CM

## 2020-09-06 NOTE — Progress Notes (Signed)
   Covid-19 Vaccination Clinic  Name:  Joel Kelley    MRN: [id number] DOB: Feb 04, 1934  09/06/2020  Mr. Joel Kelley was observed post Covid-19 immunization for 15 minutes without incident. He was provided with Vaccine Information Sheet and instruction to access the V-Safe system.   Mr. Joel Kelley was instructed to call 911 with any severe reactions post vaccine: Marland Kitchen Difficulty breathing  . Swelling of face and throat  . A fast heartbeat  . A bad rash all over body  . Dizziness and weakness

## 2020-09-13 ENCOUNTER — Other Ambulatory Visit: Payer: Self-pay

## 2020-09-14 ENCOUNTER — Encounter: Payer: Self-pay | Admitting: Family Medicine

## 2020-09-14 ENCOUNTER — Ambulatory Visit (INDEPENDENT_AMBULATORY_CARE_PROVIDER_SITE_OTHER): Payer: Federal, State, Local not specified - PPO | Admitting: Family Medicine

## 2020-09-14 VITALS — BP 132/69 | HR 65 | Temp 97.5°F | Resp 16 | Ht 65.0 in | Wt 166.4 lb

## 2020-09-14 DIAGNOSIS — G589 Mononeuropathy, unspecified: Secondary | ICD-10-CM | POA: Diagnosis not present

## 2020-09-14 DIAGNOSIS — Z0000 Encounter for general adult medical examination without abnormal findings: Secondary | ICD-10-CM | POA: Diagnosis not present

## 2020-09-14 DIAGNOSIS — E039 Hypothyroidism, unspecified: Secondary | ICD-10-CM

## 2020-09-14 DIAGNOSIS — E7800 Pure hypercholesterolemia, unspecified: Secondary | ICD-10-CM

## 2020-09-14 LAB — CBC WITH DIFFERENTIAL/PLATELET
Basophils Absolute: 0.1 10*3/uL (ref 0.0–0.1)
Basophils Relative: 1.3 % (ref 0.0–3.0)
Eosinophils Absolute: 0.3 10*3/uL (ref 0.0–0.7)
Eosinophils Relative: 5.1 % — ABNORMAL HIGH (ref 0.0–5.0)
HCT: 46.1 % (ref 39.0–52.0)
Hemoglobin: 15.4 g/dL (ref 13.0–17.0)
Lymphocytes Relative: 25.2 % (ref 12.0–46.0)
Lymphs Abs: 1.3 10*3/uL (ref 0.7–4.0)
MCHC: 33.5 g/dL (ref 30.0–36.0)
MCV: 95.3 fl (ref 78.0–100.0)
Monocytes Absolute: 0.6 10*3/uL (ref 0.1–1.0)
Monocytes Relative: 10.9 % (ref 3.0–12.0)
Neutro Abs: 3 10*3/uL (ref 1.4–7.7)
Neutrophils Relative %: 57.5 % (ref 43.0–77.0)
Platelets: 194 10*3/uL (ref 150.0–400.0)
RBC: 4.83 Mil/uL (ref 4.22–5.81)
RDW: 13.9 % (ref 11.5–15.5)
WBC: 5.2 10*3/uL (ref 4.0–10.5)

## 2020-09-14 LAB — TSH: TSH: 0.08 u[IU]/mL — ABNORMAL LOW (ref 0.35–4.50)

## 2020-09-14 LAB — COMPREHENSIVE METABOLIC PANEL
ALT: 12 U/L (ref 0–53)
AST: 20 U/L (ref 0–37)
Albumin: 3.9 g/dL (ref 3.5–5.2)
Alkaline Phosphatase: 70 U/L (ref 39–117)
BUN: 19 mg/dL (ref 6–23)
CO2: 28 mEq/L (ref 19–32)
Calcium: 8.8 mg/dL (ref 8.4–10.5)
Chloride: 108 mEq/L (ref 96–112)
Creatinine, Ser: 1.01 mg/dL (ref 0.40–1.50)
GFR: 68.4 mL/min (ref >60.00)
Glucose, Bld: 96 mg/dL (ref 70–99)
Potassium: 4.7 mEq/L (ref 3.5–5.1)
Sodium: 142 mEq/L (ref 135–145)
Total Bilirubin: 0.9 mg/dL (ref 0.2–1.2)
Total Protein: 6.2 g/dL (ref 6.0–8.3)

## 2020-09-14 LAB — LIPID PANEL
Cholesterol: 121 mg/dL (ref 0–200)
HDL: 41.5 mg/dL (ref >39.00)
LDL Cholesterol: 67 mg/dL (ref 0–99)
NonHDL: 79.65
Total CHOL/HDL Ratio: 3
Triglycerides: 63 mg/dL (ref 0.0–149.0)
VLDL: 12.6 mg/dL (ref 0.0–40.0)

## 2020-09-14 NOTE — Progress Notes (Signed)
Office Note 09/14/2020  CC:  Chief Complaint  Patient presents with  . Annual Exam    pt is fasting    HPI:  Joel Kelley is a 86 y.o. White male who is here for annual health maintenance exam and 6 mo f/u hypothyroidism and HLD. A/P as of last visit: ") Hypothyroidism: monitor TSH again today. If normal then we can resume q53mo to annual monitoring.  2) HLD: tolerating statin. LDL 65 and hepatic panel normal 6 mo ago. Plan repeat 6 mo.  3) Left hand middle finger pain: suspect flexor tenosynovitis/flexor tendon sheath cyst. Refer to ortho hand specialist for further eval."  INTERIM HX: Finger inj x 2 since last visit and this feels good now.  HLD: no probs with daily statin.  Hypoth: Takes T4 on empty stomach w/out any other meds.  Home bp monitoring all normal.  Reports tingling/burning/numbness in fingers, hard to tell whether worse in any particular fingers but seems probably 4th and 5th, palmar >dorsal?  About 2 mo hx of this now.  No neck pain. Worse with driving and sometimes wakes him up at night. No weakness. Working with hands---they don't bother him at all.    Past Medical History:  Diagnosis Date  . Adenomatous colon polyp 2012; 01/2016; 03/2019   Bx 2012 showed tubular adenoma but no HG dysplasia.  2017 : adenomatous.  03/2019: adenomatous->Recall 3 yrs vs no repeat (patient's choice per GI).  . Asymptomatic cholelithiasis 2017  . Diverticulosis 03/2019   Sigmoid (on colonoscopy)  . Erectile dysfunction   . Hearing loss of both ears    +hearing aids  . HTN (hypertension)   . Hypothyroidism   . Increased prostate specific antigen (PSA) velocity 11/11/2011   Biopsy negative 03/11/12.  Followed by Dr. Alinda Money (mild rise in PSA 2015, but Dr. Alinda Money and pt agreed to continue with annual PSA/DRE, no invasive procedure).  PSA 4.19 04/2017.  Dr. Alinda Money recommended no further PSA testing, just annual DRE.  Marland Kitchen Pleural effusion associated with pulmonary infection  04/2014   PULM: benign parapneumonic effusion; resolved 05/2014  . Pulmonary nodule 2015   with subcarinal adenopathy; stable on serial CT scans in 2015 and 09/2015, and 03/2016.  F/u CT 03/2017 stable---MAY BE CONSIDERED BENIGN.  NO FURTHER CT's needed.  . Stress fracture of foot 2017   right    Past Surgical History:  Procedure Laterality Date  . CARDIOVASCULAR STRESS TEST  07/06/14   NORMAL Myocardial perfusion imaging, normal EF, normal LV function, no wall motion abnormality  . COLONOSCOPY W/ POLYPECTOMY  2012; 01/2016; 03/2019   2017 adenomatous polyp.  Rpt 03/2019->adenomatous polyp->rpt 3 yrs OR no further due to aging out->pt's choice per GI  . KNEE SURGERY     2009, laperoscopic  . TONSILLECTOMY  1970  . TRIGGER FINGER INJECTION Left 03/29/2020  . UMBILICAL HERNIA REPAIR  1980    Family History  Problem Relation Age of Onset  . Hypertension Mother   . Hypertension Father     Social History   Socioeconomic History  . Marital status: Married    Spouse name: [name]  . Number of children: Not on file  . Years of education: Not on file  . Highest education level: Not on file  Occupational History  . Occupation: Retired Actor   Tobacco Use  . Smoking status: Former Smoker    Packs/day: 0.25    Years: 40.00    Pack years: 10.00    Types: Cigarettes  Quit date: 11/04/1988    Years since quitting: 31.8  . Smokeless tobacco: Never Used  Substance and Sexual Activity  . Alcohol use: No    Comment: quit in 1990   . Drug use: No  . Sexual activity: Not on file  Other Topics Concern  . Not on file  Social History Narrative   Divorced, remarried, one son and one step daughter (step-daughter is local), 2 grandchildren.   Relocated from [place] about 2010.   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   Distant tobacco abuse: avg 5 cigs day for 20 yrs on and off, quit for good around 1990.   No alcohol.   Daily walking at the park.   Social Determinants of Health   Financial  Resource Strain:   . Difficulty of Paying Living Expenses: Not on file  Food Insecurity:   . Worried About Charity fundraiser in the Last Year: Not on file  . Ran Out of Food in the Last Year: Not on file  Transportation Needs:   . Lack of Transportation (Medical): Not on file  . Lack of Transportation (Non-Medical): Not on file  Physical Activity:   . Days of Exercise per Week: Not on file  . Minutes of Exercise per Session: Not on file  Stress:   . Feeling of Stress : Not on file  Social Connections:   . Frequency of Communication with Friends and Family: Not on file  . Frequency of Social Gatherings with Friends and Family: Not on file  . Attends Religious Services: Not on file  . Active Member of Clubs or Organizations: Not on file  . Attends Archivist Meetings: Not on file  . Marital Status: Not on file  Intimate Partner Violence:   . Fear of Current or Ex-Partner: Not on file  . Emotionally Abused: Not on file  . Physically Abused: Not on file  . Sexually Abused: Not on file    Outpatient Medications Prior to Visit  Medication Sig Dispense Refill  . atorvastatin (LIPITOR) 10 MG tablet TAKE 1 TABLET BY MOUTH EVERY DAY 90 tablet 1  . calcium carbonate (CALCIUM 600) 600 MG TABS Take 600 mg by mouth daily. Reported on 02/07/2016    . ibuprofen (ADVIL,MOTRIN) 800 MG tablet Take 800 mg by mouth every 8 (eight) hours as needed for mild pain. Reported on 02/07/2016    . levothyroxine (SYNTHROID) 150 MCG tablet TAKE 1 TABLET BY MOUTH DAILY 90 tablet 1  . loratadine (CLARITIN) 10 MG tablet Take 10 mg by mouth daily. Reported on 02/07/2016    . Multiple Vitamins-Minerals (CENTRUM SILVER PO) Take 1 tablet by mouth daily. Reported on 02/07/2016    . niacin 500 MG tablet Take 500 mg by mouth daily with breakfast. Reported on 02/07/2016    . Omega-3 Fatty Acids (FISH OIL) 1000 MG CAPS Take 1 capsule by mouth daily. Reported on 02/07/2016    . pantoprazole (PROTONIX) 40 MG tablet Take 1  tablet (40 mg total) by mouth daily. 30 tablet 3  . Psyllium (METAMUCIL PO) Take 1 scoop by mouth daily. Reported on 02/07/2016     No facility-administered medications prior to visit.    No Known Allergies  ROS Review of Systems  Constitutional: Negative for appetite change, chills, fatigue and fever.  HENT: Negative for congestion, dental problem, ear pain and sore throat.   Eyes: Negative for discharge, redness and visual disturbance.  Respiratory: Negative for cough, chest tightness, shortness of breath and wheezing.  Cardiovascular: Negative for chest pain, palpitations and leg swelling.  Gastrointestinal: Negative for abdominal pain, blood in stool, diarrhea, nausea and vomiting.  Genitourinary: Negative for difficulty urinating, dysuria, flank pain, frequency, hematuria and urgency.  Musculoskeletal: Negative for arthralgias, back pain, joint swelling, myalgias and neck stiffness.  Skin: Negative for pallor and rash.  Neurological: Negative for dizziness, speech difficulty, weakness and headaches.  Hematological: Negative for adenopathy. Does not bruise/bleed easily.  Psychiatric/Behavioral: Negative for confusion and sleep disturbance. The patient is not nervous/anxious.     PE; Vitals with BMI 09/14/2020 03/10/2020 09/13/2019  Height 5\' 5"  5\' 5"  5\' 5"   Weight 166 lbs 6 oz 165 lbs 10 oz 163 lbs 10 oz  BMI 27.69 42.35 36.14  Systolic 431 540 086  Diastolic 69 80 71  Pulse 65 67 70     Gen: Alert, well appearing.  Patient is oriented to person, place, time, and situation. AFFECT: pleasant, lucid thought and speech. ENT: Ears: EACs clear, normal epithelium.  TMs with good light reflex and landmarks bilaterally.  Eyes: no injection, icteris, swelling, or exudate.  EOMI, PERRLA. Nose: no drainage or turbinate edema/swelling.  No injection or focal lesion.  Mouth: lips without lesion/swelling.  Oral mucosa pink and moist.  Dentition intact and without obvious caries or gingival  swelling.  Oropharynx without erythema, exudate, or swelling.  Neck: supple/nontender.  No LAD, mass, or TM.  Carotid pulses 2+ bilaterally, without bruits. CV: RRR, no m/r/g.   LUNGS: CTA bilat, nonlabored resps, good aeration in all lung fields. ABD: soft, NT, ND, BS normal.  No hepatospenomegaly or mass.  No bruits. EXT: no clubbing, cyanosis, or edema.  Hands: no muscle waiting, deformity, or weakness. No pallor or erythema.  Radial pulses strong bilat. No sensory abnormalities, phalen's and Tinel's testing NORMAL/NEG bilat. Percussion over ulnar groove/tunnel neg bilat. Musculoskeletal: no joint swelling, erythema, warmth, or tenderness.  ROM of all joints intact. Skin - no sores or suspicious lesions or rashes or color changes   Pertinent labs:  Lab Results  Component Value Date   TSH 1.78 03/10/2020   Lab Results  Component Value Date   WBC 6.4 09/13/2019   HGB 16.1 09/13/2019   HCT 48.0 09/13/2019   MCV 95.7 09/13/2019   PLT 195.0 09/13/2019   Lab Results  Component Value Date   CREATININE 1.00 09/13/2019   BUN 17 09/13/2019   NA 142 09/13/2019   K 4.7 09/13/2019   CL 106 09/13/2019   CO2 29 09/13/2019   Lab Results  Component Value Date   ALT 13 09/13/2019   AST 17 09/13/2019   ALKPHOS 76 09/13/2019   BILITOT 0.9 09/13/2019   Lab Results  Component Value Date   CHOL 132 09/13/2019   Lab Results  Component Value Date   HDL 48.80 09/13/2019   Lab Results  Component Value Date   LDLCALC 65 09/13/2019   Lab Results  Component Value Date   TRIG 91.0 09/13/2019   Lab Results  Component Value Date   CHOLHDL 3 09/13/2019   Lab Results  Component Value Date   PSA 3.94 11/11/2011   PSA 2.61 12/17/2010    ASSESSMENT AND PLAN:   1) HLD: tolerating statin.  Diet and activity level are good. FLP and hepatic panel today.  2) Hypothyroidism: compliant with T4, taking med correctly. TSH today.  3) Periph neuropathy: sensory sx's consistent with this  dx---bilat hands. No exam abnormalities today. Discussed likely dx with pt, possible further investigations (neuro  ref for consideration of NCS/EMGs). He elects to follow expectantly at this time, call if progressing.  4) Health maintenance exam: Reviewed age and gender appropriate health maintenance issues (prudent diet, regular exercise, health risks of tobacco and excessive alcohol, use of seatbelts, fire alarms in home, use of sunscreen).  Also reviewed age and gender appropriate health screening as well as vaccine recommendations. Vaccines: ALL UTD. Labs: fasting HP labs ordered. Prostate ca screening: no further prostate ca screening indicated due to age. Colon ca screening: repeat 03/2022 IF pt elects to pursue further screening.  An After Visit Summary was printed and given to the patient.  FOLLOW UP:  Return in about 6 months (around 03/14/2021) for routine chronic illness f/u.  Signed:  Crissie Sickles, MD           09/14/2020

## 2020-09-14 NOTE — Patient Instructions (Signed)

## 2020-09-15 ENCOUNTER — Other Ambulatory Visit: Payer: Self-pay

## 2020-09-15 DIAGNOSIS — E039 Hypothyroidism, unspecified: Secondary | ICD-10-CM

## 2020-09-28 ENCOUNTER — Other Ambulatory Visit: Payer: Self-pay | Admitting: Family Medicine

## 2020-10-30 ENCOUNTER — Other Ambulatory Visit: Payer: Self-pay

## 2020-10-30 ENCOUNTER — Ambulatory Visit (INDEPENDENT_AMBULATORY_CARE_PROVIDER_SITE_OTHER): Payer: Federal, State, Local not specified - PPO

## 2020-10-30 DIAGNOSIS — E039 Hypothyroidism, unspecified: Secondary | ICD-10-CM | POA: Diagnosis not present

## 2020-10-30 LAB — TSH: TSH: 0.57 u[IU]/mL (ref 0.35–4.50)

## 2021-03-19 ENCOUNTER — Ambulatory Visit: Payer: Federal, State, Local not specified - PPO | Admitting: Family Medicine

## 2021-03-19 ENCOUNTER — Other Ambulatory Visit: Payer: Self-pay

## 2021-03-19 ENCOUNTER — Encounter: Payer: Self-pay | Admitting: Family Medicine

## 2021-03-19 VITALS — BP 137/61 | HR 71 | Temp 97.5°F | Resp 16 | Ht 65.0 in | Wt 169.2 lb

## 2021-03-19 DIAGNOSIS — E039 Hypothyroidism, unspecified: Secondary | ICD-10-CM | POA: Diagnosis not present

## 2021-03-19 DIAGNOSIS — E7800 Pure hypercholesterolemia, unspecified: Secondary | ICD-10-CM

## 2021-03-19 NOTE — Addendum Note (Signed)
Addended by: Kavin Leech on: 03/19/2021 08:39 AM   Modules accepted: Orders

## 2021-03-19 NOTE — Progress Notes (Signed)
OFFICE VISIT  03/19/2021  CC:  Chief Complaint  Patient presents with  . Follow-up    HLD, hypothyroidism. Pt is fasting   HPI:    Patient is a 87 y.o. Caucasian male who presents for 6 mo f/u hyperlipidemia and hypothyroidism. A/P as of last visit: "1) HLD: tolerating statin.  Diet and activity level are good. FLP and hepatic panel today.  2) Hypothyroidism: compliant with T4, taking med correctly. TSH today.  3) Periph neuropathy: sensory sx's consistent with this dx---bilat hands. No exam abnormalities today. Discussed likely dx with pt, possible further investigations (neuro ref for consideration of NCS/EMGs). He elects to follow expectantly at this time, call if progressing.  4) Health maintenance exam: Reviewed age and gender appropriate health maintenance issues (prudent diet, regular exercise, health risks of tobacco and excessive alcohol, use of seatbelts, fire alarms in home, use of sunscreen).  Also reviewed age and gender appropriate health screening as well as vaccine recommendations. Vaccines: ALL UTD. Labs: fasting HP labs ordered. Prostate ca screening: no further prostate ca screening indicated due to age. Colon ca screening: repeat 03/2022 IF pt elects to pursue further screening."  INTERIM HX: Feeling well.   Walking regularly.   Thyroid: TSH a little low last visit so small down-titration of dosing was done (1/2 150 mcg tab M-F, whole tab Sat and Sun), and subsequent f/u TSH 4-6 wks later was 0.57 (WNL).   HLD: lipids excellent 6 mo ago (LDL 67)  Past Medical History:  Diagnosis Date  . Adenomatous colon polyp 2012; 01/2016; 03/2019   Bx 2012 showed tubular adenoma but no HG dysplasia.  2017 : adenomatous.  03/2019: adenomatous->Recall 3 yrs vs no repeat (patient's choice per GI).  . Asymptomatic cholelithiasis 2017  . Diverticulosis 03/2019   Sigmoid (on colonoscopy)  . Erectile dysfunction   . Hearing loss of both ears    +hearing aids  . HTN  (hypertension)   . Hypothyroidism   . Increased prostate specific antigen (PSA) velocity 11/11/2011   Biopsy negative 03/11/12.  Followed by Dr. Alinda Money (mild rise in PSA 2015, but Dr. Alinda Money and pt agreed to continue with annual PSA/DRE, no invasive procedure).  PSA 4.19 04/2017.  Dr. Alinda Money recommended no further PSA testing, just annual DRE.  Marland Kitchen Pleural effusion associated with pulmonary infection 04/2014   PULM: benign parapneumonic effusion; resolved 05/2014  . Pulmonary nodule 2015   with subcarinal adenopathy; stable on serial CT scans in 2015 and 09/2015, and 03/2016.  F/u CT 03/2017 stable---MAY BE CONSIDERED BENIGN.  NO FURTHER CT's needed.  . Stress fracture of foot 2017   right    Past Surgical History:  Procedure Laterality Date  . CARDIOVASCULAR STRESS TEST  07/06/14   NORMAL Myocardial perfusion imaging, normal EF, normal LV function, no wall motion abnormality  . COLONOSCOPY W/ POLYPECTOMY  2012; 01/2016; 03/2019   2017 adenomatous polyp.  Rpt 03/2019->adenomatous polyp->rpt 3 yrs OR no further due to aging out->pt's choice per GI  . KNEE SURGERY     2009, laperoscopic  . TONSILLECTOMY  1970  . TRIGGER FINGER INJECTION Left 03/29/2020  . [place]    Outpatient Medications Prior to Visit  Medication Sig Dispense Refill  . atorvastatin (LIPITOR) 10 MG tablet TAKE 1 TABLET BY MOUTH EVERY DAY 90 tablet 1  . calcium carbonate (OS-CAL) 600 MG TABS tablet Take 600 mg by mouth daily. Reported on 02/07/2016    . levothyroxine (SYNTHROID) 150 MCG tablet TAKE 1  TABLET BY MOUTH DAILY 90 tablet 1  . loratadine (CLARITIN) 10 MG tablet Take 10 mg by mouth daily. Reported on 02/07/2016    . Multiple Vitamins-Minerals (CENTRUM SILVER PO) Take 1 tablet by mouth daily. Reported on 02/07/2016    . niacin 500 MG tablet Take 500 mg by mouth daily with breakfast. Reported on 02/07/2016    . Omega-3 Fatty Acids (FISH OIL) 1000 MG CAPS Take 1 capsule by mouth daily. Reported on 02/07/2016    .  pantoprazole (PROTONIX) 40 MG tablet Take 1 tablet (40 mg total) by mouth daily. 30 tablet 3  . Psyllium (METAMUCIL PO) Take 1 scoop by mouth daily. Reported on 02/07/2016    . ibuprofen (ADVIL,MOTRIN) 800 MG tablet Take 800 mg by mouth every 8 (eight) hours as needed for mild pain. Reported on 02/07/2016 (Patient not taking: Reported on 03/19/2021)    . COVID-19 mRNA vaccine, Pfizer, 30 MCG/0.3ML injection INJECT AS DIRECTED .3 mL 0   No facility-administered medications prior to visit.    No Known Allergies  ROS As per HPI  PE: Vitals with BMI 03/19/2021 09/14/2020 03/10/2020  Height 5\' 5"  5\' 5"  5\' 5"   Weight 169 lbs 3 oz 166 lbs 6 oz 165 lbs 10 oz  BMI 28.16 29.93 71.69  Systolic 678 938 101  Diastolic 61 69 80  Pulse 71 65 67    Gen: Alert, well appearing.  Patient is oriented to person, place, time, and situation. AFFECT: pleasant, lucid thought and speech. CV: RRR, no m/r/g.   LUNGS: CTA bilat, nonlabored resps, good aeration in all lung fields. EXT: no clubbing or cyanosis.  Scattered non-inflamed varicose veins but no edema.    LABS:  Lab Results  Component Value Date   TSH 0.57 10/30/2020   Lab Results  Component Value Date   WBC 5.2 09/14/2020   HGB 15.4 09/14/2020   HCT 46.1 09/14/2020   MCV 95.3 09/14/2020   PLT 194.0 09/14/2020   Lab Results  Component Value Date   CREATININE 1.01 09/14/2020   BUN 19 09/14/2020   NA 142 09/14/2020   K 4.7 09/14/2020   CL 108 09/14/2020   CO2 28 09/14/2020   Lab Results  Component Value Date   ALT 12 09/14/2020   AST 20 09/14/2020   ALKPHOS 70 09/14/2020   BILITOT 0.9 09/14/2020   Lab Results  Component Value Date   CHOL 121 09/14/2020   Lab Results  Component Value Date   HDL 41.50 09/14/2020   Lab Results  Component Value Date   LDLCALC 67 09/14/2020   Lab Results  Component Value Date   TRIG 63.0 09/14/2020   Lab Results  Component Value Date   CHOLHDL 3 09/14/2020   Lab Results  Component Value  Date   PSA 3.94 11/11/2011   PSA 2.61 12/17/2010    IMPRESSION AND PLAN:  1) Hypothyroidism: doing well. TSH monitoring today.  2) HLD: tolerating atorvastatin 10mg  qd and lipids excellent 6 mo ago. Rpt FLP and hepatic panel 68mo.  An After Visit Summary was printed and given to the patient.  FOLLOW UP: Return in about 6 months (around 09/19/2021) for annual CPE (fasting).  Signed:  Crissie Sickles, MD           03/19/2021

## 2021-03-20 LAB — TSH: TSH: 1.51 mIU/L (ref 0.40–4.50)

## 2021-04-04 ENCOUNTER — Other Ambulatory Visit: Payer: Self-pay | Admitting: Family Medicine

## 2021-06-08 DIAGNOSIS — G5603 Carpal tunnel syndrome, bilateral upper limbs: Secondary | ICD-10-CM | POA: Diagnosis not present

## 2021-06-11 ENCOUNTER — Encounter: Payer: Self-pay | Admitting: Family Medicine

## 2021-07-04 ENCOUNTER — Telehealth: Payer: Self-pay

## 2021-07-04 NOTE — Telephone Encounter (Signed)
Patient refill request.  Patient has changed pharmacy locations.  [facility] is telling patient that he needs new prescriptions on both meds below sent to them via fax. Please update patient when complete at [phone].  atorvastatin (LIPITOR) 10 MG tablet [id number]   levothyroxine (SYNTHROID) 150 MCG tablet [id number]  Dosage has changed since last fill date.  Patient states he takes 1/2 tab.  This prescription does not need to be filled, please note on prescription to put on file.   New pharmacy location:  [place], [place]

## 2021-07-04 NOTE — Telephone Encounter (Signed)
Pt's pharmacy has been updated. LM for pt to return call.

## 2021-07-04 NOTE — Telephone Encounter (Signed)
Patient returned call, I relayed Britt's message that pharmacy has been updated. Patient voiced understanding.

## 2021-07-05 ENCOUNTER — Other Ambulatory Visit: Payer: Self-pay

## 2021-07-05 MED ORDER — LEVOTHYROXINE SODIUM 150 MCG PO TABS
ORAL_TABLET | ORAL | 1 refills | Status: DC
Start: 2021-07-05 — End: 2021-10-03

## 2021-07-05 NOTE — Telephone Encounter (Signed)
Should be 1/2 tab M-F, whole tab Sat and Sun.

## 2021-07-05 NOTE — Telephone Encounter (Addendum)
Refills not needed for meds currently but please verify if dosing should be 1/2 or 1 tab for levothyroxine. Pt has been taking 1/2 tab qd

## 2021-07-05 NOTE — Telephone Encounter (Signed)
Spoke with pt regarding rx dosing, he verified this is what he has been taking. Rx updated but not sent to pharmacy. When he needs a refill, he will contact the pharmacy to send request.

## 2021-07-24 ENCOUNTER — Other Ambulatory Visit (HOSPITAL_BASED_OUTPATIENT_CLINIC_OR_DEPARTMENT_OTHER): Payer: Self-pay

## 2021-07-24 ENCOUNTER — Ambulatory Visit: Payer: Federal, State, Local not specified - PPO | Attending: Internal Medicine

## 2021-07-24 DIAGNOSIS — Z23 Encounter for immunization: Secondary | ICD-10-CM

## 2021-07-24 MED ORDER — INFLUENZA VAC A&B SA ADJ QUAD 0.5 ML IM PRSY
PREFILLED_SYRINGE | INTRAMUSCULAR | 0 refills | Status: DC
  Filled 2021-07-24: qty 0.5, 1d supply, fill #0

## 2021-07-24 NOTE — Progress Notes (Signed)
   [facility]  Name:  Nicola Quesnell    MRN: [id number] DOB: 1934/05/02  07/24/2021  Mr. Hidrogo was observed post Covid-19 immunization for 15 minutes without incident. He was provided with Vaccine Information Sheet and instruction to access the V-Safe system.   Mr. Keeble was instructed to call 911 with any severe reactions post vaccine: Difficulty breathing  Swelling of face and throat  A fast heartbeat  A bad rash all over body  Dizziness and weakness

## 2021-07-31 ENCOUNTER — Other Ambulatory Visit (HOSPITAL_BASED_OUTPATIENT_CLINIC_OR_DEPARTMENT_OTHER): Payer: Self-pay

## 2021-07-31 MED ORDER — COVID-19MRNA BIVAL VACC PFIZER 30 MCG/0.3ML IM SUSP
INTRAMUSCULAR | 0 refills | Status: DC
  Filled 2021-07-31: qty 0.3, 1d supply, fill #0

## 2021-09-19 ENCOUNTER — Ambulatory Visit (INDEPENDENT_AMBULATORY_CARE_PROVIDER_SITE_OTHER): Payer: Federal, State, Local not specified - PPO | Admitting: Family Medicine

## 2021-09-19 ENCOUNTER — Other Ambulatory Visit: Payer: Self-pay

## 2021-09-19 ENCOUNTER — Encounter: Payer: Self-pay | Admitting: Family Medicine

## 2021-09-19 VITALS — BP 145/75 | HR 76 | Temp 97.8°F | Ht 65.5 in | Wt 166.8 lb

## 2021-09-19 DIAGNOSIS — K219 Gastro-esophageal reflux disease without esophagitis: Secondary | ICD-10-CM | POA: Diagnosis not present

## 2021-09-19 DIAGNOSIS — Z0000 Encounter for general adult medical examination without abnormal findings: Secondary | ICD-10-CM | POA: Diagnosis not present

## 2021-09-19 DIAGNOSIS — M720 Palmar fascial fibromatosis [Dupuytren]: Secondary | ICD-10-CM | POA: Diagnosis not present

## 2021-09-19 DIAGNOSIS — Z23 Encounter for immunization: Secondary | ICD-10-CM

## 2021-09-19 DIAGNOSIS — E039 Hypothyroidism, unspecified: Secondary | ICD-10-CM | POA: Diagnosis not present

## 2021-09-19 DIAGNOSIS — E7800 Pure hypercholesterolemia, unspecified: Secondary | ICD-10-CM

## 2021-09-19 LAB — LIPID PANEL
Cholesterol: 129 mg/dL (ref 0–200)
HDL: 48.4 mg/dL (ref >39.00)
LDL Cholesterol: 68 mg/dL (ref 0–99)
NonHDL: 80.99
Total CHOL/HDL Ratio: 3
Triglycerides: 65 mg/dL (ref 0.0–149.0)
VLDL: 13 mg/dL (ref 0.0–40.0)

## 2021-09-19 LAB — CBC WITH DIFFERENTIAL/PLATELET
Basophils Absolute: 0.1 10*3/uL (ref 0.0–0.1)
Basophils Relative: 1 % (ref 0.0–3.0)
Eosinophils Absolute: 0.2 10*3/uL (ref 0.0–0.7)
Eosinophils Relative: 4.6 % (ref 0.0–5.0)
HCT: 45.8 % (ref 39.0–52.0)
Hemoglobin: 15.1 g/dL (ref 13.0–17.0)
Lymphocytes Relative: 29.2 % (ref 12.0–46.0)
Lymphs Abs: 1.5 10*3/uL (ref 0.7–4.0)
MCHC: 33 g/dL (ref 30.0–36.0)
MCV: 95.5 fl (ref 78.0–100.0)
Monocytes Absolute: 0.5 10*3/uL (ref 0.1–1.0)
Monocytes Relative: 10.3 % (ref 3.0–12.0)
Neutro Abs: 2.8 10*3/uL (ref 1.4–7.7)
Neutrophils Relative %: 54.9 % (ref 43.0–77.0)
Platelets: 202 10*3/uL (ref 150.0–400.0)
RBC: 4.79 Mil/uL (ref 4.22–5.81)
RDW: 14.3 % (ref 11.5–15.5)
WBC: 5.1 10*3/uL (ref 4.0–10.5)

## 2021-09-19 LAB — COMPREHENSIVE METABOLIC PANEL
ALT: 11 U/L (ref 0–53)
AST: 17 U/L (ref 0–37)
Albumin: 4.1 g/dL (ref 3.5–5.2)
Alkaline Phosphatase: 71 U/L (ref 39–117)
BUN: 18 mg/dL (ref 6–23)
CO2: 28 mEq/L (ref 19–32)
Calcium: 8.8 mg/dL (ref 8.4–10.5)
Chloride: 107 mEq/L (ref 96–112)
Creatinine, Ser: 0.98 mg/dL (ref 0.40–1.50)
GFR: 70.42 mL/min (ref >60.00)
Glucose, Bld: 92 mg/dL (ref 70–99)
Potassium: 4.3 mEq/L (ref 3.5–5.1)
Sodium: 144 mEq/L (ref 135–145)
Total Bilirubin: 0.8 mg/dL (ref 0.2–1.2)
Total Protein: 6.5 g/dL (ref 6.0–8.3)

## 2021-09-19 LAB — TSH: TSH: 0.96 u[IU]/mL (ref 0.35–5.50)

## 2021-09-19 NOTE — Patient Instructions (Signed)

## 2021-09-19 NOTE — Progress Notes (Signed)
Office Note 09/19/2021  CC:  Chief Complaint  Patient presents with   Annual Exam    Pt is fasting   HPI:  Patient is a 87 y.o. male who is here for annual health maintenance exam and annual health maintenance exam and 6 mo f/u HLD, GERD, and hypothyroidism. A/P as of last visit: "1) Hypothyroidism: doing well. TSH monitoring today.   2) HLD: tolerating atorvastatin 10mg  qd and lipids excellent 6 mo ago. Rpt FLP and hepatic panel 46mo."  INTERIM HX: Labs last visit normal. Patient is feeling good other than right hand discomfort and contracture deformity on palmar surface of the middle finger.  Bothers him more more lately and he has a history of similar thing on left hand and a steroid injection by Dr. Jeannie Fend resulted in excellent results. Dr. Jeannie Fend apparently left his practice and patient was assigned to see Dr. Caralyn Guile when he had right hand issues recently.  Patient says Dr. Caralyn Guile recommended surgery, but patient reports this is being negative experience and he is requesting a second opinion.  Laverna Peace is walking regularly on a treadmill.  He takes pantoprazole to 40 mg a day and if he does not take this he has quick return of reflux.  No problems taking Lipitor 10 mg/day and thyroid medication daily on empty stomach.  Past Medical History:  Diagnosis Date   Adenomatous colon polyp 2012; 01/2016; 03/2019   Bx 2012 showed tubular adenoma but no HG dysplasia.  2017 : adenomatous.  03/2019: adenomatous->Recall 3 yrs vs no repeat (patient's choice per GI).   Asymptomatic cholelithiasis 2017   Carpal tunnel syndrome, bilateral    Diverticulosis 03/2019   Sigmoid (on colonoscopy)   Erectile dysfunction    Hearing loss of both ears    +hearing aids   HTN (hypertension)    Hypothyroidism    Increased prostate specific antigen (PSA) velocity 11/11/2011   Biopsy negative 03/11/12.  Followed by Dr. Alinda Money (mild rise in PSA 2015, but Dr. Alinda Money and pt agreed to continue with  annual PSA/DRE, no invasive procedure).  PSA 4.19 04/2017.  Dr. Alinda Money recommended no further PSA testing, just annual DRE.   Pleural effusion associated with pulmonary infection 04/2014   PULM: benign parapneumonic effusion; resolved 05/2014   Pulmonary nodule 2015   with subcarinal adenopathy; stable on serial CT scans in 2015 and 09/2015, and 03/2016.  F/u CT 03/2017 stable---MAY BE CONSIDERED BENIGN.  NO FURTHER CT's needed.   Stress fracture of foot 2017   right    Past Surgical History:  Procedure Laterality Date   CARDIOVASCULAR STRESS TEST  07/06/14   NORMAL Myocardial perfusion imaging, normal EF, normal LV function, no wall motion abnormality   COLONOSCOPY W/ POLYPECTOMY  2012; 01/2016; 03/2019   2017 adenomatous polyp.  Rpt 03/2019->adenomatous polyp->rpt 3 yrs OR no further due to aging out->pt's choice per GI   KNEE SURGERY     2009, laperoscopic   TONSILLECTOMY  1970   TRIGGER FINGER INJECTION Left 35/57/3220   UMBILICAL HERNIA REPAIR  1980    Family History  Problem Relation Age of Onset   Hypertension Mother    Hypertension Father     Social History   Socioeconomic History   Marital status: Married    Spouse name: [name]   Number of children: Not on file   Years of education: Not on file   Highest education level: Not on file  Occupational History   Occupation: Retired Actor   Tobacco Use  Smoking status: Former    Packs/day: 0.25    Years: 40.00    Pack years: 10.00    Types: Cigarettes    Quit date: 11/04/1988    Years since quitting: 32.8   Smokeless tobacco: Never  Substance and Sexual Activity   Alcohol use: No    Comment: quit in 1990    Drug use: No   Sexual activity: Not on file  Other Topics Concern   Not on file  Social History Narrative   Divorced, remarried, one son and one step daughter (step-daughter is local), 2 grandchildren.   Relocated from [place] about 2010.   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   Distant tobacco abuse: avg 5 cigs day  for 20 yrs on and off, quit for good around 1990.   No alcohol.   Daily walking at the park.   Social Determinants of Health   Financial Resource Strain: Not on file  Food Insecurity: Not on file  Transportation Needs: Not on file  Physical Activity: Not on file  Stress: Not on file  Social Connections: Not on file  Intimate Partner Violence: Not on file    Outpatient Medications Prior to Visit  Medication Sig Dispense Refill   atorvastatin (LIPITOR) 10 MG tablet TAKE 1 TABLET BY MOUTH EVERY DAY 90 tablet 1   calcium carbonate (OS-CAL) 600 MG TABS tablet Take 600 mg by mouth daily. Reported on 02/07/2016     COVID-19 mRNA bivalent vaccine, Pfizer, injection Inject into the muscle. 0.3 mL 0   influenza vaccine adjuvanted (FLUAD) 0.5 ML injection Inject into the muscle. 0.5 mL 0   levothyroxine (SYNTHROID) 150 MCG tablet TAKE 1/2 TAB MONDAY THRU FRIDAY AND WHOLE TABLET ON SATURDAYS AND SUNDAYS 90 tablet 1   loratadine (CLARITIN) 10 MG tablet Take 10 mg by mouth daily. Reported on 02/07/2016     Multiple Vitamins-Minerals (CENTRUM SILVER PO) Take 1 tablet by mouth daily. Reported on 02/07/2016     niacin 500 MG tablet Take 500 mg by mouth daily with breakfast. Reported on 02/07/2016     Omega-3 Fatty Acids (FISH OIL) 1000 MG CAPS Take 1 capsule by mouth daily. Reported on 02/07/2016     pantoprazole (PROTONIX) 40 MG tablet Take 1 tablet (40 mg total) by mouth daily. 30 tablet 3   Psyllium (METAMUCIL PO) Take 1 scoop by mouth daily. Reported on 02/07/2016     ibuprofen (ADVIL,MOTRIN) 800 MG tablet Take 800 mg by mouth every 8 (eight) hours as needed for mild pain. Reported on 02/07/2016 (Patient not taking: No sig reported)     No facility-administered medications prior to visit.    No Known Allergies  ROS Review of Systems  Constitutional:  Negative for appetite change, chills, fatigue and fever.  HENT:  Negative for congestion, dental problem, ear pain and sore throat.   Eyes:  Negative for  discharge, redness and visual disturbance.  Respiratory:  Negative for cough, chest tightness, shortness of breath and wheezing.   Cardiovascular:  Negative for chest pain, palpitations and leg swelling.  Gastrointestinal:  Negative for abdominal pain, blood in stool, diarrhea, nausea and vomiting.  Genitourinary:  Negative for difficulty urinating, dysuria, flank pain, frequency, hematuria and urgency.  Musculoskeletal:  Negative for arthralgias, back pain, joint swelling, myalgias and neck stiffness.  Skin:  Negative for pallor and rash.  Neurological:  Negative for dizziness, speech difficulty, weakness and headaches.  Hematological:  Negative for adenopathy. Does not bruise/bleed easily.  Psychiatric/Behavioral:  Negative for confusion and  sleep disturbance. The patient is not nervous/anxious.    PE; Vitals with BMI 09/19/2021 03/19/2021 09/14/2020  Height 5' 5.5" 5\' 5"  5\' 5"   Weight 166 lbs 13 oz 169 lbs 3 oz 166 lbs 6 oz  BMI 27.32 58.52 77.82  Systolic 423 536 144  Diastolic 75 61 69  Pulse 76 71 65     Gen: Alert, well appearing.  Patient is oriented to person, place, time, and situation. AFFECT: pleasant, lucid thought and speech. ENT: Ears: EACs clear, normal epithelium.  TMs with good light reflex and landmarks bilaterally.  Eyes: no injection, icteris, swelling, or exudate.  EOMI, PERRLA. Nose: no drainage or turbinate edema/swelling.  No injection or focal lesion.  Mouth: lips without lesion/swelling.  Oral mucosa pink and moist.  Dentition intact and without obvious caries or gingival swelling.  Oropharynx without erythema, exudate, or swelling.  Neck: supple/nontender.  No LAD, mass, or TM.  Carotid pulses 2+ bilaterally, without bruits. CV: RRR, no m/r/g.   LUNGS: CTA bilat, nonlabored resps, good aeration in all lung fields. ABD: soft, NT, ND, BS normal.  No hepatospenomegaly or mass.  No bruits. EXT: no clubbing, cyanosis, or edema.  Musculoskeletal: R hand palmar  thickening over flexor tendon of R middle finger, mild flexion contracture. Otherwise no joint swelling, erythema, warmth, or tenderness.  ROM of all joints intact. Skin - no sores or suspicious lesions or rashes or color changes  Pertinent labs:  Lab Results  Component Value Date   TSH 1.51 03/19/2021   Lab Results  Component Value Date   WBC 5.2 09/14/2020   HGB 15.4 09/14/2020   HCT 46.1 09/14/2020   MCV 95.3 09/14/2020   PLT 194.0 09/14/2020   Lab Results  Component Value Date   CREATININE 1.01 09/14/2020   BUN 19 09/14/2020   NA 142 09/14/2020   K 4.7 09/14/2020   CL 108 09/14/2020   CO2 28 09/14/2020   Lab Results  Component Value Date   ALT 12 09/14/2020   AST 20 09/14/2020   ALKPHOS 70 09/14/2020   BILITOT 0.9 09/14/2020   Lab Results  Component Value Date   CHOL 121 09/14/2020   Lab Results  Component Value Date   HDL 41.50 09/14/2020   Lab Results  Component Value Date   LDLCALC 67 09/14/2020   Lab Results  Component Value Date   TRIG 63.0 09/14/2020   Lab Results  Component Value Date   CHOLHDL 3 09/14/2020   Lab Results  Component Value Date   PSA 3.94 11/11/2011   PSA 2.61 12/17/2010   ASSESSMENT AND PLAN:   1) Palmar fibromatosis: R hand middle finger now. Patient requesting second opinion so will refer to [facility] and request hand specialist.  Next  2.  Hyperlipidemia.  Tolerating atorvastatin 10 mg a day. Lipid panel and hepatic panel today.  #3 hypothyroidism.Hypoth: Takes T4 on empty stomach w/out any other meds.  Hold 150 mcg tab Saturdays and Sundays, half tab all other days.  TSH checked today.  #4 GERD.  Requiring maintenance pantoprazole 40 mg daily.  5. Health maintenance exam: Reviewed age and gender appropriate health maintenance issues (prudent diet, regular exercise, health risks of tobacco and excessive alcohol, use of seatbelts, fire alarms in home, use of sunscreen).  Also reviewed age and gender  appropriate health screening as well as vaccine recommendations. Vaccines: Tdap->given today.  Otherwise ALL UTD. Labs: fasting HP labs ordered. Prostate ca screening: no further prostate ca screening indicated due  to age. Colon ca screening: repeat 03/2022 IF pt elects to pursue further screening.  An After Visit Summary was printed and given to the patient.  FOLLOW UP:  Return in about 6 months (around 03/19/2022) for routine chronic illness f/u.  Signed:  Crissie Sickles, MD           09/19/2021

## 2021-10-03 ENCOUNTER — Other Ambulatory Visit: Payer: Self-pay

## 2021-10-03 MED ORDER — ATORVASTATIN CALCIUM 10 MG PO TABS
10.0000 mg | ORAL_TABLET | Freq: Every day | ORAL | 1 refills | Status: DC
Start: 2021-10-03 — End: 2022-03-14

## 2021-10-03 MED ORDER — LEVOTHYROXINE SODIUM 150 MCG PO TABS: ORAL_TABLET | ORAL | 1 refills | Status: DC

## 2021-10-16 ENCOUNTER — Other Ambulatory Visit: Payer: Self-pay

## 2021-10-16 NOTE — Telephone Encounter (Signed)
Refill request received from [facility] via fax for levothyroxine and atorvastatin. Medications were refilled on 11/30 and sent to [facility]. LVM for pt to verify of pharmacy change and if meds need to be transferred.

## 2021-10-17 NOTE — Telephone Encounter (Signed)
Spoke with pt and verified he no longer uses [facility] in [place] any longer.

## 2021-10-18 ENCOUNTER — Other Ambulatory Visit: Payer: Self-pay | Admitting: Medical

## 2021-10-18 ENCOUNTER — Telehealth (INDEPENDENT_AMBULATORY_CARE_PROVIDER_SITE_OTHER): Payer: Federal, State, Local not specified - PPO | Admitting: Medical

## 2021-10-18 DIAGNOSIS — R051 Acute cough: Secondary | ICD-10-CM

## 2021-10-18 DIAGNOSIS — U071 COVID-19: Secondary | ICD-10-CM

## 2021-10-18 MED ORDER — BENZONATATE 100 MG PO CAPS: 100.0000 mg | ORAL_CAPSULE | Freq: Three times a day (TID) | ORAL | 0 refills | Status: DC | PRN

## 2021-10-18 MED ORDER — MOLNUPIRAVIR EUA 200MG CAPSULE: 4.0000 | ORAL_CAPSULE | Freq: Two times a day (BID) | ORAL | 0 refills | Status: AC

## 2021-10-18 MED ORDER — FLUTICASONE PROPIONATE 50 MCG/ACT NA SUSP: 2.0000 | Freq: Every day | NASAL | 1 refills | Status: DC

## 2021-10-18 NOTE — Progress Notes (Signed)
° °  Subjective:    Patient ID: Joel Kelley, male    DOB: 08/23/1934, 87 y.o.   MRN: [id number]  HPI Virtual Visit via Video Note  I connected with Joel Kelley on 10/18/21 at  1:40 PM EST by a video enabled telemedicine application and verified that I am speaking with the correct person using two identifiers.  Location: Patient: home Provider: office   I discussed the limitations of evaluation and management by telemedicine and the availability of in person appointments. The patient expressed understanding and agreed to proceed.  History of Present Illness: Pt recently diagnosed with covid on   Test used-  History of covid vaccine x   Covid risk score- 5  History of covid infection-  Current symptoms-3 days ago had onset of severe cough. Cough has subsided a lot but still present. No fever, no sweats, no body aches, no sob and no wheezing.  Pt has been vaccinated x 4. Had most recent bi valent booster.    Pt 02 sat- 94-96%(but machine appeared to have hard time reading?)     Observations/Objective:  General-no acute distress, pleasant, oriented. Lungs- on inspection lungs appear unlabored. Neck- no tracheal deviation or jvd on inspection. Neuro- gross motor function appears intact.   Assessment and Plan: Patient Instructions  COVID infection.  Mild symptoms presently with risk score of 5.  Vaccinated x4 with most recent by valent booster in the fall.  Prescribing molnupiravir antiviral to start today.  Counseled patient on starting the patient day since past to start the antiviral within 5 days of symptom onset.  Prescribed Flonase nasal spray to use if nasal congestion occurs.  Can use benzonatate for cough.  Check O2 sats daily to make sure O2 sats above 96%.  Make sure that your finger is warm and that you keep the O2 sat monitor on your finger for adequate time.  Is getting shortness of breath or chest congestion and recommend getting outpatient chest x-ray.   Future order chest x-ray placed for med center location.  If signs symptoms worsen or change dramatically then ED evaluation.  Follow-up in 7 days or sooner if needed.   Mackie Pai, PA-C   Follow Up Instructions:    I discussed the assessment and treatment plan with the patient. The patient was provided an opportunity to ask questions and all were answered. The patient agreed with the plan and demonstrated an understanding of the instructions.   The patient was advised to call back or seek an in-person evaluation if the symptoms worsen or if the condition fails to improve as anticipated.  Time spent with patient today was 25 minutes which consisted of chart revdiew, discussing diagnosis, work up treatment and documentation.    Mackie Pai, PA-C    Review of Systems  Constitutional:  Negative for chills, fatigue and fever.  HENT:  Negative for congestion.   Respiratory:  Positive for cough. Negative for chest tightness, shortness of breath and wheezing.   Cardiovascular:  Negative for chest pain and palpitations.  Gastrointestinal:  Negative for abdominal pain.  Genitourinary:  Negative for dysuria.  Musculoskeletal:  Negative for back pain, myalgias and neck stiffness.  Skin:  Negative for rash.  Neurological:  Negative for dizziness, speech difficulty, weakness, light-headedness and headaches.  Hematological:  Negative for adenopathy.  Psychiatric/Behavioral:  Negative for behavioral problems and confusion.       Objective:   Physical Exam        Assessment & Plan:

## 2021-10-18 NOTE — Patient Instructions (Signed)
COVID infection.  Mild symptoms presently with risk score of 5.  Vaccinated x4 with most recent by valent booster in the fall.  Prescribing molnupiravir antiviral to start today.  Counseled patient on starting the patient day since past to start the antiviral within 5 days of symptom onset.  Prescribed Flonase nasal spray to use if nasal congestion occurs.  Can use benzonatate for cough.  Check O2 sats daily to make sure O2 sats above 96%.  Make sure that your finger is warm and that you keep the O2 sat monitor on your finger for adequate time.  Is getting shortness of breath or chest congestion and recommend getting outpatient chest x-ray.  Future order chest x-ray placed for med center location.  If signs symptoms worsen or change dramatically then ED evaluation.  Follow-up in 7 days or sooner if needed.

## 2021-10-19 ENCOUNTER — Telehealth: Payer: Federal, State, Local not specified - PPO | Admitting: Family Medicine

## 2021-11-23 DIAGNOSIS — M25441 Effusion, right hand: Secondary | ICD-10-CM | POA: Diagnosis not present

## 2021-11-23 DIAGNOSIS — M1811 Unilateral primary osteoarthritis of first carpometacarpal joint, right hand: Secondary | ICD-10-CM | POA: Diagnosis not present

## 2021-11-23 DIAGNOSIS — M65331 Trigger finger, right middle finger: Secondary | ICD-10-CM | POA: Diagnosis not present

## 2022-01-04 DIAGNOSIS — M65331 Trigger finger, right middle finger: Secondary | ICD-10-CM | POA: Diagnosis not present

## 2022-03-07 DIAGNOSIS — Z86010 Personal history of colonic polyps: Secondary | ICD-10-CM | POA: Diagnosis not present

## 2022-03-19 ENCOUNTER — Encounter: Payer: Self-pay | Admitting: Family Medicine

## 2022-03-19 ENCOUNTER — Ambulatory Visit: Payer: Federal, State, Local not specified - PPO | Admitting: Family Medicine

## 2022-03-19 VITALS — BP 130/80 | HR 59 | Temp 97.4°F | Resp 12 | Wt 171.0 lb

## 2022-03-19 DIAGNOSIS — M79644 Pain in right finger(s): Secondary | ICD-10-CM

## 2022-03-19 DIAGNOSIS — K219 Gastro-esophageal reflux disease without esophagitis: Secondary | ICD-10-CM

## 2022-03-19 DIAGNOSIS — M65341 Trigger finger, right ring finger: Secondary | ICD-10-CM

## 2022-03-19 DIAGNOSIS — E7800 Pure hypercholesterolemia, unspecified: Secondary | ICD-10-CM

## 2022-03-19 DIAGNOSIS — E039 Hypothyroidism, unspecified: Secondary | ICD-10-CM | POA: Diagnosis not present

## 2022-03-19 LAB — COMPREHENSIVE METABOLIC PANEL
ALT: 13 U/L (ref 0–53)
AST: 16 U/L (ref 0–37)
Albumin: 4.1 g/dL (ref 3.5–5.2)
Alkaline Phosphatase: 69 U/L (ref 39–117)
BUN: 19 mg/dL (ref 6–23)
CO2: 29 mEq/L (ref 19–32)
Calcium: 8.7 mg/dL (ref 8.4–10.5)
Chloride: 107 mEq/L (ref 96–112)
Creatinine, Ser: 1.01 mg/dL (ref 0.40–1.50)
GFR: 67.68 mL/min (ref >60.00)
Glucose, Bld: 107 mg/dL — ABNORMAL HIGH (ref 70–99)
Potassium: 4.2 mEq/L (ref 3.5–5.1)
Sodium: 143 mEq/L (ref 135–145)
Total Bilirubin: 0.8 mg/dL (ref 0.2–1.2)
Total Protein: 6.4 g/dL (ref 6.0–8.3)

## 2022-03-19 LAB — LIPID PANEL
Cholesterol: 129 mg/dL (ref 0–200)
HDL: 43.8 mg/dL (ref >39.00)
LDL Cholesterol: 71 mg/dL (ref 0–99)
NonHDL: 85.31
Total CHOL/HDL Ratio: 3
Triglycerides: 72 mg/dL (ref 0.0–149.0)
VLDL: 14.4 mg/dL (ref 0.0–40.0)

## 2022-03-19 LAB — TSH: TSH: 4.95 u[IU]/mL (ref 0.35–5.50)

## 2022-03-19 MED ORDER — ATORVASTATIN CALCIUM 10 MG PO TABS: 10.0000 mg | ORAL_TABLET | Freq: Every day | ORAL | 1 refills | Status: DC

## 2022-03-19 MED ORDER — LEVOTHYROXINE SODIUM 150 MCG PO TABS: ORAL_TABLET | ORAL | 1 refills | Status: DC

## 2022-03-19 MED ORDER — FLUTICASONE PROPIONATE 50 MCG/ACT NA SUSP: NASAL | 1 refills | Status: DC

## 2022-03-19 NOTE — Progress Notes (Signed)
OFFICE VISIT ?03/19/2022 ? ?CC:  ?Chief Complaint  ?Patient presents with  ? Follow-up  ?  6 mo follow up, pt denies concerns   ? ? ?Patient is a 88 y.o. male who presents for 6 mo f/u HLD, hypothyroidism, and GERD. ?A/P as of last visit: ?"1) Palmar fibromatosis: R hand middle finger now. ?Patient requesting second opinion so will refer to [facility] and request hand specialist.  Next ? ?2.  Hyperlipidemia.  Tolerating atorvastatin 10 mg a day. ?Lipid panel and hepatic panel today. ?  ?#3 hypothyroidism.Hypoth: Takes T4 on empty stomach w/out any other meds.  Hold 150 mcg tab Saturdays and Sundays, half tab all other days.  TSH checked today. ?  ?#4 GERD.  Requiring maintenance pantoprazole 40 mg daily. ?  ?5. Health maintenance exam: ?Reviewed age and gender appropriate health maintenance issues (prudent diet, regular exercise, health risks of tobacco and excessive alcohol, use of seatbelts, fire alarms in home, use of sunscreen).  Also reviewed age and gender appropriate health screening as well as vaccine recommendations. ?Vaccines: Tdap->given today.  Otherwise ALL UTD. ?Labs: fasting HP labs ordered. ?Prostate ca screening: no further prostate ca screening indicated due to age. ?Colon ca screening: repeat 03/2022 IF pt elects to pursue further screening." ? ?INTERIM HX: ?Laverna Peace feels very well. ?Home blood pressure monitoring shows consistently 130/80 or better. ? ?He saw GI 03/07/2022 to discuss possible repeat colonoscopy--this is planned for June. ? ?Trigger finger injection of the right middle by his hand surgeon did result in significant improvement but not quite all the way back to normal. ?Now says right hand ring finger with some stiffness and tenderness. ?He wants to try a steroid injection for this today. ? ?ROS as above, plus--> no fevers, no CP, no SOB, no wheezing, no cough, no dizziness, no HAs, no rashes, no melena/hematochezia.  No polyuria or polydipsia.  No myalgias or arthralgias.  No  focal weakness, paresthesias, or tremors.  No acute vision or hearing abnormalities.  No dysuria or unusual/new urinary urgency or frequency.  No recent changes in lower legs. ?No n/v/d or abd pain.  No palpitations.   ? ?Past Medical History:  ?Diagnosis Date  ? Adenomatous colon polyp 2012; 01/2016; 03/2019  ? Bx 2012 showed tubular adenoma but no HG dysplasia.  2017 : adenomatous.  03/2019: adenomatous->Recall 3 yrs vs no repeat (patient's choice per GI).  ? Asymptomatic cholelithiasis 2017  ? Carpal tunnel syndrome, bilateral   ? Diverticulosis 03/2019  ? Sigmoid (on colonoscopy)  ? Erectile dysfunction   ? Hearing loss of both ears   ? +hearing aids  ? HTN (hypertension)   ? Hypothyroidism   ? Increased prostate specific antigen (PSA) velocity 11/11/2011  ? Biopsy negative 03/11/12.  Followed by Dr. Alinda Money (mild rise in PSA 2015, but Dr. Alinda Money and pt agreed to continue with annual PSA/DRE, no invasive procedure).  PSA 4.19 04/2017.  Dr. Alinda Money recommended no further PSA testing, just annual DRE.  ? Pleural effusion associated with pulmonary infection 04/2014  ? PULM: benign parapneumonic effusion; resolved 05/2014  ? Pulmonary nodule 2015  ? with subcarinal adenopathy; stable on serial CT scans in 2015 and 09/2015, and 03/2016.  F/u CT 03/2017 stable---MAY BE CONSIDERED BENIGN.  NO FURTHER CT's needed.  ? Stress fracture of foot 2017  ? right  ? ? ?Past Surgical History:  ?Procedure Laterality Date  ? CARDIOVASCULAR STRESS TEST  07/06/14  ? NORMAL Myocardial perfusion imaging, normal EF, normal LV function, no  wall motion abnormality  ? COLONOSCOPY W/ POLYPECTOMY  2012; 01/2016; 03/2019  ? 2017 adenomatous polyp.  Rpt 03/2019->adenomatous polyp->rpt 3 yrs OR no further due to aging out->pt's choice per GI  ? KNEE SURGERY    ? 2009, laperoscopic  ? TONSILLECTOMY  1970  ? TRIGGER FINGER INJECTION Left 03/29/2020  ? [place]  ? ? ?Outpatient Medications Prior to Visit  ?Medication Sig Dispense Refill  ?  benzonatate (TESSALON) 100 MG capsule Take 1 capsule (100 mg total) by mouth 3 (three) times daily as needed for cough. 30 capsule 0  ? calcium carbonate (OS-CAL) 600 MG TABS tablet Take 600 mg by mouth daily. Reported on 02/07/2016    ? ibuprofen (ADVIL,MOTRIN) 800 MG tablet Take 800 mg by mouth every 8 (eight) hours as needed for mild pain. Reported on 02/07/2016    ? loratadine (CLARITIN) 10 MG tablet Take 10 mg by mouth daily. Reported on 02/07/2016    ? Multiple Vitamins-Minerals (CENTRUM SILVER PO) Take 1 tablet by mouth daily. Reported on 02/07/2016    ? niacin 500 MG tablet Take 500 mg by mouth daily with breakfast. Reported on 02/07/2016    ? Omega-3 Fatty Acids (FISH OIL) 1000 MG CAPS Take 1 capsule by mouth daily. Reported on 02/07/2016    ? pantoprazole (PROTONIX) 40 MG tablet Take 1 tablet (40 mg total) by mouth daily. 30 tablet 3  ? Psyllium (METAMUCIL PO) Take 1 scoop by mouth daily. Reported on 02/07/2016    ? atorvastatin (LIPITOR) 10 MG tablet Take 1 tablet (10 mg total) by mouth daily. 90 tablet 1  ? fluticasone (FLONASE) 50 MCG/ACT nasal spray SHAKE LIQUID AND USE 2 SPRAYS IN EACH NOSTRIL DAILY 48 g 0  ? levothyroxine (SYNTHROID) 150 MCG tablet TAKE 1/2 TAB MONDAY THRU FRIDAY AND WHOLE TABLET ON SATURDAYS AND SUNDAYS 90 tablet 1  ? ?No facility-administered medications prior to visit.  ? ? ?No Known Allergies ? ?ROS ?As per HPI ? ?PE: ? ?  03/19/2022  ?  8:11 AM 03/19/2022  ?  8:06 AM 09/19/2021  ?  8:07 AM  ?Vitals with BMI  ?Height   5' 5.5"  ?Weight  171 lbs 166 lbs 13 oz  ?BMI   27.32  ?Systolic 572 620 355  ?Diastolic 78 81 75  ?Pulse 59 59 76  ? ? ? ?Physical Exam ? ?Gen: Alert, well appearing.  Patient is oriented to person, place, time, and situation. ?AFFECT: pleasant, lucid thought and speech. ?CV: RRR, no m/r/g.   ?LUNGS: CTA bilat, nonlabored resps, good aeration in all lung fields. ?EXT: no clubbing or cyanosis.  no edema.  ?Right long finger and ring finger with extension to about 160 degrees at  the MCP joint. ?Mild tenderness over ring finger MCP region.  Swelling or erythema.  No rash. ? ?LABS:  ?Last CBC ?Lab Results  ?Component Value Date  ? WBC 5.1 09/19/2021  ? HGB 15.1 09/19/2021  ? HCT 45.8 09/19/2021  ? MCV 95.5 09/19/2021  ? MCH 32.6 03/23/2014  ? RDW 14.3 09/19/2021  ? PLT 202.0 09/19/2021  ? ?Last metabolic panel ?Lab Results  ?Component Value Date  ? GLUCOSE 92 09/19/2021  ? NA 144 09/19/2021  ? K 4.3 09/19/2021  ? CL 107 09/19/2021  ? CO2 28 09/19/2021  ? BUN 18 09/19/2021  ? CREATININE 0.98 09/19/2021  ? GFRNONAA 78 (L) 03/23/2014  ? CALCIUM 8.8 09/19/2021  ? PROT 6.5 09/19/2021  ? ALBUMIN 4.1 09/19/2021  ?  BILITOT 0.8 09/19/2021  ? ALKPHOS 71 09/19/2021  ? AST 17 09/19/2021  ? ALT 11 09/19/2021  ? ?Last lipids ?Lab Results  ?Component Value Date  ? CHOL 129 09/19/2021  ? HDL 48.40 09/19/2021  ? [place] 68 09/19/2021  ? TRIG 65.0 09/19/2021  ? CHOLHDL 3 09/19/2021  ? ?Last thyroid functions ?Lab Results  ?Component Value Date  ? TSH 0.96 09/19/2021  ? ?IMPRESSION AND PLAN: ? ?#1 hypertension, normal blood pressure without medication.  Initial blood pressure up here today but recheck 130/80. ?Continue with no medication. ? ?2 hyperlipidemia, doing well on atorvastatin 10 mg a day. ?Lipid panel and hepatic panel today. ? ?3.  Hypothyroidism. ?Hypoth: Takes T4 on empty stomach w/out any other meds. ?Continue 150 mcg tab, half tab Monday through Friday and whole tab on Saturdays and Sundays. ?TSH today. ? ?#4 right hand ring finger pain, trigger finger. ?Pt did desire trial of steroid injection today. ?Ultrasound-guided injection is preferred based on studies that show increased duration, increased effect, greater accuracy, decreased procedural pain, increased response rate, and decreased cost with ultrasound-guided versus blind injection. ?Procedure: Real-time ultrasound guided injection of right ring finger A1 pulley. ?Device: GE LogiQ E ?Verbal informed consent obtained.  Timeout conducted.   No overlying erythema, induration, or other signs of local infection. ?After sterile prep with Betadine, injected 1/2 cc of 1% lidocaine without epinephrine and 20 mg kenalog using 25G 1" needle, out of plane

## 2022-03-25 ENCOUNTER — Ambulatory Visit (INDEPENDENT_AMBULATORY_CARE_PROVIDER_SITE_OTHER): Payer: Federal, State, Local not specified - PPO

## 2022-03-25 DIAGNOSIS — R7309 Other abnormal glucose: Secondary | ICD-10-CM | POA: Diagnosis not present

## 2022-03-25 LAB — POCT GLYCOSYLATED HEMOGLOBIN (HGB A1C): Hemoglobin A1C: 5.4 % (ref 4.0–5.6)

## 2022-03-25 NOTE — Progress Notes (Signed)
Per the orders of  Dr. Anitra Lauth pt is here for POCT: A1C , pt tolerated Finger stick well.   A1C: 5.4%

## 2022-04-02 DIAGNOSIS — M79644 Pain in right finger(s): Secondary | ICD-10-CM | POA: Diagnosis not present

## 2022-04-02 DIAGNOSIS — E7800 Pure hypercholesterolemia, unspecified: Secondary | ICD-10-CM | POA: Diagnosis not present

## 2022-04-02 DIAGNOSIS — M65341 Trigger finger, right ring finger: Secondary | ICD-10-CM | POA: Diagnosis not present

## 2022-04-02 MED ORDER — TRIAMCINOLONE ACETONIDE 40 MG/ML IJ SUSP
40.0000 mg | Freq: Once | INTRAMUSCULAR | Status: AC
  Administered 2022-04-02: 40 mg via INTRAMUSCULAR

## 2022-04-02 NOTE — Addendum Note (Signed)
Addended by: Deveron Furlong D on: 04/02/2022 01:43 PM   Modules accepted: Orders

## 2022-04-23 DIAGNOSIS — D122 Benign neoplasm of ascending colon: Secondary | ICD-10-CM | POA: Diagnosis not present

## 2022-04-23 DIAGNOSIS — Z86010 Personal history of colonic polyps: Secondary | ICD-10-CM | POA: Diagnosis not present

## 2022-04-23 DIAGNOSIS — D125 Benign neoplasm of sigmoid colon: Secondary | ICD-10-CM | POA: Diagnosis not present

## 2022-04-23 DIAGNOSIS — D123 Benign neoplasm of transverse colon: Secondary | ICD-10-CM | POA: Diagnosis not present

## 2022-04-23 LAB — HM COLONOSCOPY

## 2022-07-03 ENCOUNTER — Telehealth: Payer: Self-pay

## 2022-07-03 MED ORDER — ATORVASTATIN CALCIUM 10 MG PO TABS: 10.0000 mg | ORAL_TABLET | Freq: Every day | ORAL | 0 refills | Status: DC

## 2022-07-03 NOTE — Telephone Encounter (Signed)
Patient refill request.  Tiger told patient no refills left on current prescription  atorvastatin (LIPITOR) 10 MG tablet [[id number]]

## 2022-07-03 NOTE — Telephone Encounter (Signed)
Patient advised of update. New refill sent to [facility].

## 2022-07-03 NOTE — Telephone Encounter (Signed)
Last refill completed 03/19/22 #90 with 1 refill. Spoke with pharmacy, confirmed patient had 1 refill remaining but on 8/10 transferred to another store [facility] # [phone]). Patient will be made aware.

## 2022-07-17 ENCOUNTER — Ambulatory Visit (INDEPENDENT_AMBULATORY_CARE_PROVIDER_SITE_OTHER): Payer: Federal, State, Local not specified - PPO

## 2022-07-17 ENCOUNTER — Encounter: Payer: Self-pay | Admitting: Family Medicine

## 2022-07-17 DIAGNOSIS — Z23 Encounter for immunization: Secondary | ICD-10-CM | POA: Diagnosis not present

## 2022-08-16 ENCOUNTER — Encounter: Payer: Self-pay | Admitting: Family Medicine

## 2022-08-16 ENCOUNTER — Ambulatory Visit: Payer: Federal, State, Local not specified - PPO | Admitting: Family Medicine

## 2022-08-16 VITALS — BP 113/65 | HR 80 | Temp 97.7°F | Ht 65.5 in | Wt 171.8 lb

## 2022-08-16 DIAGNOSIS — M7050 Other bursitis of knee, unspecified knee: Secondary | ICD-10-CM

## 2022-08-16 DIAGNOSIS — M25561 Pain in right knee: Secondary | ICD-10-CM

## 2022-08-16 MED ORDER — MELOXICAM 15 MG PO TABS: 15.0000 mg | ORAL_TABLET | Freq: Every day | ORAL | 0 refills | Status: DC

## 2022-08-16 NOTE — Progress Notes (Signed)
OFFICE VISIT  08/16/2022  CC:  Chief Complaint  Patient presents with   Knee Pain    Right x4 days, has not taken any otc meds. Unsure if he injured himself. Applying pressure hurts and hurts more at night when lying down. Denies swelling   Patient is a 88 y.o. male who presents for right knee pain.  HPI: Onset about 4 days ago.  He did not twist or hit. He has been doing more yard work lately, pushing and aerator up and down a mild slope.  The pain is focal along the medial aspect of the knee.  He has not noticed any significant swelling of the knee.  It causes him to limp quite a bit. He tried some ice. No medications tried. No history of recent problems with this knee in the past.  He has a very remote history of a lateral meniscus repair in this knee. No history of gout.  ROS: no f/c/malaise  Past Medical History:  Diagnosis Date   Adenomatous colon polyp 2012; 01/2016; 03/2019   Bx 2012 showed tubular adenoma but no HG dysplasia.  2017 : adenomatous.  03/2019: adenomatous->Recall 3 yrs vs no repeat (patient's choice per GI).   Asymptomatic cholelithiasis 2017   Carpal tunnel syndrome, bilateral    Diverticulosis 03/2019   Sigmoid (on colonoscopy)   Erectile dysfunction    Hearing loss of both ears    +hearing aids   HTN (hypertension)    Hypothyroidism    Increased prostate specific antigen (PSA) velocity 11/11/2011   Biopsy negative 03/11/12.  Followed by Dr. Alinda Money (mild rise in PSA 2015, but Dr. Alinda Money and pt agreed to continue with annual PSA/DRE, no invasive procedure).  PSA 4.19 04/2017.  Dr. Alinda Money recommended no further PSA testing, just annual DRE.   Pleural effusion associated with pulmonary infection 04/2014   PULM: benign parapneumonic effusion; resolved 05/2014   Pulmonary nodule 2015   with subcarinal adenopathy; stable on serial CT scans in 2015 and 09/2015, and 03/2016.  F/u CT 03/2017 stable---MAY BE CONSIDERED BENIGN.  NO FURTHER CT's needed.   Stress fracture  of foot 2017   right    Past Surgical History:  Procedure Laterality Date   CARDIOVASCULAR STRESS TEST  07/06/14   NORMAL Myocardial perfusion imaging, normal EF, normal LV function, no wall motion abnormality   COLONOSCOPY W/ POLYPECTOMY  2012; 01/2016; 03/2019   2017 adenomatous polyp.  Rpt 03/2019->adenomatous polyp->rpt 3 yrs OR no further due to aging out->pt's choice per GI   KNEE SURGERY     2009, laperoscopic   TONSILLECTOMY  1970   TRIGGER FINGER INJECTION Left 67/67/2094   UMBILICAL HERNIA REPAIR  1980    Outpatient Medications Prior to Visit  Medication Sig Dispense Refill   atorvastatin (LIPITOR) 10 MG tablet Take 1 tablet (10 mg total) by mouth daily. 90 tablet 0   calcium carbonate (OS-CAL) 600 MG TABS tablet Take 600 mg by mouth daily. Reported on 02/07/2016     fluticasone (FLONASE) 50 MCG/ACT nasal spray SHAKE LIQUID AND USE 2 SPRAYS IN EACH NOSTRIL DAILY 48 g 1   ibuprofen (ADVIL,MOTRIN) 800 MG tablet Take 800 mg by mouth every 8 (eight) hours as needed for mild pain. Reported on 02/07/2016     levothyroxine (SYNTHROID) 150 MCG tablet TAKE 1/2 TAB MONDAY THRU FRIDAY AND WHOLE TABLET ON SATURDAYS AND SUNDAYS 90 tablet 1   loratadine (CLARITIN) 10 MG tablet Take 10 mg by mouth daily. Reported on 02/07/2016  Multiple Vitamins-Minerals (CENTRUM SILVER PO) Take 1 tablet by mouth daily. Reported on 02/07/2016     niacin 500 MG tablet Take 500 mg by mouth daily with breakfast. Reported on 02/07/2016     Omega-3 Fatty Acids (FISH OIL) 1000 MG CAPS Take 1 capsule by mouth daily. Reported on 02/07/2016     pantoprazole (PROTONIX) 40 MG tablet Take 1 tablet (40 mg total) by mouth daily. 30 tablet 3   Psyllium (METAMUCIL PO) Take 1 scoop by mouth daily. Reported on 02/07/2016     benzonatate (TESSALON) 100 MG capsule Take 1 capsule (100 mg total) by mouth 3 (three) times daily as needed for cough. (Patient not taking: Reported on 08/16/2022) 30 capsule 0   No facility-administered medications  prior to visit.    No Known Allergies  ROS As per HPI  PE:    08/16/2022   10:40 AM 03/19/2022    8:55 AM 03/19/2022    8:11 AM  Vitals with BMI  Height 5' 5.5"    Weight 171 lbs 13 oz    BMI 08.14    Systolic 481 856 314  Diastolic 65 80 78  Pulse 80  59   Physical Exam  Gen: Alert, well appearing.  Patient is oriented to person, place, time, and situation. AFFECT: pleasant, lucid thought and speech. Significantly antalgic gait. Knee exam - full range of motion, no pain on motion, no effusion.  Just a touch of warmth to the anterior aspect of the knee diffusely. No joint line tenderness but he does have a significant amount of focal tenderness in the pes anserine area.  Quad and patellar tendons feel normal and without tenderness.  No crepitus. No ligamentous instability or deformity noted. Leg strength normal in proximal and distal muscles.  LABS:  None  IMPRESSION AND PLAN:  Acute right knee pain, suspect pes anserine bursitis versus osteoarthritis flare (favor the former). (Bedside u/s today showed just a bit more than physiologic level of fluid in suprapatellar pouch and lateral and medial gutters. Some degenerative changes in the medial meniscus without hyperemia. Mild hypoechoic changes in pes anserine region.  Minimal DJD changes medially).  Plan: Meloxicam 15 mg a day x10 days, Voltaren over-the-counter gel pea-sized amount applied to focal area of tenderness 4 times daily. Ice 20 minutes twice a day. Range of motion exercises.   Relative rest. Consider injection and/or x-ray if not improving in 4 to 5 days.  An After Visit Summary was printed and given to the patient.  FOLLOW UP: Return for call in about 4-5 days if not improving significantly.  Signed:  Crissie Sickles, MD           08/16/2022

## 2022-08-16 NOTE — Patient Instructions (Addendum)
Use otc voltaren gel on the area 4 times a day.  Ice the area of pain on your knee for 20 min twice a day.  Do Range of motion exercises a few times a day.  Minimize activity some.  Pes Anserine Bursitis  The pes anserine is an area on the inside of your knee, just below the joint, where the muscles are attached to the bone by tendons. This area is cushioned by a fluid-filled sac (bursa). Pes anserine bursitis is a condition that happens when the bursa gets swollen and irritated and causes knee pain. What are the causes? This condition may be caused by: Making the same movement over and over. A direct hit (trauma) to the inside of the leg below the knee joint. What increases the risk? You are more likely to develop this condition if you: Are a runner. Play sports that involve a lot of running and quick side-to-side movements (cutting). Are an athlete who plays contact sports. Swim using an inward angle of the knee, such as with the breaststroke. Have tight hamstring muscles. Are a woman. Are overweight. Have flat feet. Have diabetes or osteoarthritis. What are the signs or symptoms? Symptoms of this condition include: Knee pain that gets better with rest and worse with activities like climbing stairs, walking, running, or getting in and out of a chair. Swelling. Warmth. Tenderness when pressing at the inside of the lower leg, just below the knee joint. How is this diagnosed? This condition is usually diagnosed based on: Your symptoms. Your medical history. A physical exam. During your physical exam, your health care provider will press on the tendon attachment to see if you feel pain. Your health care provider will check your hip and knee motion and strength. In rare cases, tests are used to check for swelling and fluid buildup in the bursa and to look at muscles, bones, and tendons. These tests might include: X-rays. MRI. Ultrasound. How is this treated? This condition  may be treated by: Resting your knee. You may be told to raise (elevate) your knee while sitting or lying down. Avoiding activities that cause pain. Icing the inside of your knee. Applying heat to your knee. Wearing an elastic wrap or compression knee sleeve to support your knee. Sleeping with a pillow between your knees. This will cushion your injured knee. Taking medicine to reduce pain and swelling. Getting corticosteroid injections into the knee to reduce pain and swelling. Doing strengthening and stretching exercises (physical therapy). If these treatments do not work or if the condition keeps coming back, you may need to have surgery to remove the bursa. Follow these instructions at home: If you have a removable compression wrap or sleeve: Wear it as told by your health care provider. Remove it only as told by your health care provider. Loosen the wrap or sleeve if your foot or toes tingle, become numb, or turn cold and blue. Keep the wrap or sleeve clean. If the wrap or sleeve is not waterproof: Remove it if allowed by your health care provider. Do not let it get wet. Cover it with a watertight covering when you take a bath or shower if you must wear it. Managing pain, stiffness, and swelling     If directed, put ice on the injured area. Put ice in a plastic bag. Place a towel between your skin and the bag. Leave the ice on for 20 minutes, 2-3 times a day. Remove the ice if your skin turns bright red. This  is very important. If you cannot feel pain, heat, or cold, you have a greater risk of damage to the area. Move your toes often to reduce stiffness and swelling. Elevate the injured area above the level of your heart while you are sitting or lying down. If directed, apply heat to the affected area. Use the heat source that your health care provider recommends, such as a moist heat pack or a heating pad. Place a towel between your skin and the heat source. Leave the heat on  for 20-30 minutes. Remove the heat if your skin turns bright red. This is especially important if you are unable to feel pain, heat, or cold. You may have a greater risk of getting burned. Activity Return to your normal activities as told by your health care provider. Ask your health care provider what activities are safe for you. Do exercises as told by your health care provider and physical therapist. General instructions Take over-the-counter and prescription medicines only as told by your health care provider. Sleep with a pillow between your knees. Do not use any products that contain nicotine or tobacco. These products include cigarettes, chewing tobacco, and vaping devices, such as e-cigarettes. These can delay healing. If you need help quitting, ask your health care provider. If you are overweight, work with your health care provider and a dietitian to set a weight-loss goal that is healthy and reasonable for you. Keep all follow-up visits. This is important. How is this prevented? Warm up and stretch before being active. Cool down and stretch after being active. Give your body time to rest between periods of activity. Use equipment that fits you. Be safe and responsible while being active to avoid falls. Maintain a healthy weight. Maintain physical fitness, including: Strength. Flexibility. Cardiovascular fitness. Endurance. Contact a health care provider if: Your symptoms do not improve. Your symptoms get worse. Summary Pes anserine bursitis is a condition that happens when the fluid-filled sac (bursa) at the inside of your knee gets swollen, irritated, and causes pain. Treatment for pes anserine bursitis may include resting your knee, icing the inside of your knee, sleeping with a pillow between your knees, taking medicine by mouth or by injection, and doing strengthening and stretching exercises (physical therapy). Follow instructions for managing pain, stiffness, and  swelling. Take over-the-counter and prescription medicines only as told by your health care provider. This information is not intended to replace advice given to you by your health care provider. Make sure you discuss any questions you have with your health care provider. Document Revised: 10/16/2021 Document Reviewed: 10/16/2021 Elsevier Patient Education  [place].

## 2022-08-22 ENCOUNTER — Encounter: Payer: Self-pay | Admitting: Family Medicine

## 2022-08-22 ENCOUNTER — Ambulatory Visit: Payer: Federal, State, Local not specified - PPO | Admitting: Family Medicine

## 2022-08-22 VITALS — BP 124/66 | HR 69 | Temp 97.7°F | Ht 65.5 in | Wt 174.2 lb

## 2022-08-22 DIAGNOSIS — M7051 Other bursitis of knee, right knee: Secondary | ICD-10-CM

## 2022-08-22 DIAGNOSIS — M25561 Pain in right knee: Secondary | ICD-10-CM | POA: Diagnosis not present

## 2022-08-22 MED ORDER — TRIAMCINOLONE ACETONIDE 40 MG/ML IJ SUSP
40.0000 mg | Freq: Once | INTRAMUSCULAR | Status: AC
  Administered 2022-08-22: 40 mg via INTRA_ARTICULAR

## 2022-08-22 NOTE — Addendum Note (Signed)
Addended by: Deveron Furlong D on: 08/22/2022 11:43 AM   Modules accepted: Orders

## 2022-08-22 NOTE — Progress Notes (Signed)
OFFICE VISIT  08/22/2022  CC:  Chief Complaint  Patient presents with   Knee Pain    Right; no improvement with Meloxicam, he has 3 tabs left. Still hurting like crazy   Follow-up   Patient is a 88 y.o. male who presents for right knee pain. I saw him 6 days ago for this concern. A/P as of last visit: "Acute right knee pain, suspect pes anserine bursitis versus osteoarthritis flare (favor the former). (Bedside u/s today showed just a bit more than physiologic level of fluid in suprapatellar pouch and lateral and medial gutters. Some degenerative changes in the medial meniscus without hyperemia. Mild hypoechoic changes in pes anserine region.  Minimal DJD changes medially).   Plan: Meloxicam 15 mg a day x10 days, Voltaren over-the-counter gel pea-sized amount applied to focal area of tenderness 4 times daily. Ice 20 minutes twice a day. Range of motion exercises.   Relative rest. Consider injection and/or x-ray if not improving in 4 to 5 days."  INTERIM HX  Right knee pain is no better. Doing meloxicam and icing as recommended.  Has been resting it as well. No change in the location of pain--points to proximal tibia area medially. No fever, chills, or malaise.  Past Medical History:  Diagnosis Date   Adenomatous colon polyp 2012; 01/2016; 03/2019   Bx 2012 showed tubular adenoma but no HG dysplasia.  2017 : adenomatous.  03/2019: adenomatous->Recall 3 yrs vs no repeat (patient's choice per GI).   Asymptomatic cholelithiasis 2017   Carpal tunnel syndrome, bilateral    Diverticulosis 03/2019   Sigmoid (on colonoscopy)   Erectile dysfunction    Hearing loss of both ears    +hearing aids   HTN (hypertension)    Hypothyroidism    Increased prostate specific antigen (PSA) velocity 11/11/2011   Biopsy negative 03/11/12.  Followed by Dr. Alinda Money (mild rise in PSA 2015, but Dr. Alinda Money and pt agreed to continue with annual PSA/DRE, no invasive procedure).  PSA 4.19 04/2017.  Dr. Alinda Money  recommended no further PSA testing, just annual DRE.   Pleural effusion associated with pulmonary infection 04/2014   PULM: benign parapneumonic effusion; resolved 05/2014   Pulmonary nodule 2015   with subcarinal adenopathy; stable on serial CT scans in 2015 and 09/2015, and 03/2016.  F/u CT 03/2017 stable---MAY BE CONSIDERED BENIGN.  NO FURTHER CT's needed.   Stress fracture of foot 2017   right    Past Surgical History:  Procedure Laterality Date   CARDIOVASCULAR STRESS TEST  07/06/14   NORMAL Myocardial perfusion imaging, normal EF, normal LV function, no wall motion abnormality   COLONOSCOPY W/ POLYPECTOMY  2012; 01/2016; 03/2019   2017 adenomatous polyp.  Rpt 03/2019->adenomatous polyp->rpt 3 yrs OR no further due to aging out->pt's choice per GI   KNEE SURGERY     2009, laperoscopic   TONSILLECTOMY  1970   TRIGGER FINGER INJECTION Left 19/14/7829   UMBILICAL HERNIA REPAIR  1980    Outpatient Medications Prior to Visit  Medication Sig Dispense Refill   atorvastatin (LIPITOR) 10 MG tablet Take 1 tablet (10 mg total) by mouth daily. 90 tablet 0   calcium carbonate (OS-CAL) 600 MG TABS tablet Take 600 mg by mouth daily. Reported on 02/07/2016     fluticasone (FLONASE) 50 MCG/ACT nasal spray SHAKE LIQUID AND USE 2 SPRAYS IN EACH NOSTRIL DAILY 48 g 1   ibuprofen (ADVIL,MOTRIN) 800 MG tablet Take 800 mg by mouth every 8 (eight) hours as needed for mild pain.  Reported on 02/07/2016     levothyroxine (SYNTHROID) 150 MCG tablet TAKE 1/2 TAB MONDAY THRU FRIDAY AND WHOLE TABLET ON SATURDAYS AND SUNDAYS 90 tablet 1   loratadine (CLARITIN) 10 MG tablet Take 10 mg by mouth daily. Reported on 02/07/2016     meloxicam (MOBIC) 15 MG tablet Take 1 tablet (15 mg total) by mouth daily. 10 tablet 0   Multiple Vitamins-Minerals (CENTRUM SILVER PO) Take 1 tablet by mouth daily. Reported on 02/07/2016     niacin 500 MG tablet Take 500 mg by mouth daily with breakfast. Reported on 02/07/2016     Omega-3 Fatty Acids  (FISH OIL) 1000 MG CAPS Take 1 capsule by mouth daily. Reported on 02/07/2016     pantoprazole (PROTONIX) 40 MG tablet Take 1 tablet (40 mg total) by mouth daily. 30 tablet 3   Psyllium (METAMUCIL PO) Take 1 scoop by mouth daily. Reported on 02/07/2016     benzonatate (TESSALON) 100 MG capsule Take 1 capsule (100 mg total) by mouth 3 (three) times daily as needed for cough. (Patient not taking: Reported on 08/16/2022) 30 capsule 0   No facility-administered medications prior to visit.    No Known Allergies  ROS As per HPI  PE:    08/22/2022   10:44 AM 08/16/2022   10:40 AM 03/19/2022    8:55 AM  Vitals with BMI  Height 5' 5.5" 5' 5.5"   Weight 174 lbs 3 oz 171 lbs 13 oz   BMI 16.10 96.04   Systolic 540 981 191  Diastolic 66 65 80  Pulse 69 80      Physical Exam  General: Alert and well-appearing. Right knee without erythema or effusion.  Range of motion active and passive are full. No tenderness over the joint lines or around the patella.  Flexion and extension do not reproduce his pain.  He has significant focal tenderness over the pes anserine bursa.  LABS:  Last metabolic panel Lab Results  Component Value Date   GLUCOSE 107 (H) 03/19/2022   NA 143 03/19/2022   K 4.2 03/19/2022   CL 107 03/19/2022   CO2 29 03/19/2022   BUN 19 03/19/2022   CREATININE 1.01 03/19/2022   GFRNONAA 78 (L) 03/23/2014   CALCIUM 8.7 03/19/2022   PROT 6.4 03/19/2022   ALBUMIN 4.1 03/19/2022   BILITOT 0.8 03/19/2022   ALKPHOS 69 03/19/2022   AST 16 03/19/2022   ALT 13 03/19/2022   IMPRESSION AND PLAN:  Pes anserine bursitis. No improvement with conservative management. No significant concern for competing diagnosis at this time. We discussed steroid injection today and he was in favor of this.  Ultrasound-guided injection is preferred based on studies that show increased duration, increased effect, greater accuracy, decreased procedural pain, increased response rate, and decreased  cost with ultrasound-guided versus blind injection. Procedure: Real-time ultrasound guided injection of right leg pes anserine bursa. Device: [employer] informed consent obtained.  Timeout conducted.  No overlying erythema, induration, or other signs of local infection. After sterile prep with Betadine, injected 3 mils 1% plain lidocaine +40 mg triamcinolone into pes anserine bursa.  Patient tolerated the procedure well.  No immediate complications.  Post-injection care discussed. Advised to call if fever/chills, erythema, drainage, or persistent bleeding. Impression: Technically successful ultrasound-guided injection.  If no significant improvement with this then we will get plain radiographs of the right knee.  An After Visit Summary was printed and given to the patient.  FOLLOW UP: Return for Keep appointment  scheduled for next month.  Signed:  Crissie Sickles, MD           08/22/2022

## 2022-08-29 ENCOUNTER — Telehealth: Payer: Self-pay | Admitting: Family Medicine

## 2022-08-29 DIAGNOSIS — M25561 Pain in right knee: Secondary | ICD-10-CM

## 2022-08-29 NOTE — Telephone Encounter (Signed)
Joel Kelley is asking for an x-ray to be ordered. He has seen Dr. Anitra Lauth a couple of time regarding his knee and thinks an x-ray is necessary at this point. Please contact patient with additional information and if this request can be honored.

## 2022-08-29 NOTE — Telephone Encounter (Signed)
Pt was seen on 10/19 and steroid injection completed. Please advise if imaging can be ordered.

## 2022-08-29 NOTE — Telephone Encounter (Signed)
Pt advised.

## 2022-08-29 NOTE — Telephone Encounter (Signed)
OK, knee x-ray ordered for [facility]

## 2022-08-30 ENCOUNTER — Ambulatory Visit (HOSPITAL_BASED_OUTPATIENT_CLINIC_OR_DEPARTMENT_OTHER)
Admission: RE | Admit: 2022-08-30 | Discharge: 2022-08-30 | Disposition: A | Discharge status: Home / Self Care | Payer: Federal, State, Local not specified - PPO | Source: Ambulatory Visit | Attending: Family Medicine | Admitting: Family Medicine

## 2022-08-30 DIAGNOSIS — M25561 Pain in right knee: Secondary | ICD-10-CM | POA: Diagnosis not present

## 2022-09-03 ENCOUNTER — Telehealth: Payer: Self-pay

## 2022-09-03 NOTE — Telephone Encounter (Signed)
Patient calling in regards to knee xray results. Please call [phone]

## 2022-09-03 NOTE — Telephone Encounter (Signed)
Joel Kelley, [facility]  09/03/2022  9:00 AM EDT     Patient called.  Patient aware. Patient stated he wants to think about whether he wants to do physical therapy or not and he will call back and let us know.

## 2022-09-18 NOTE — Patient Instructions (Signed)
Health Maintenance, Male Adopting a healthy lifestyle and getting preventive care are important in promoting health and wellness. Ask your health care provider about: The right schedule for you to have regular tests and exams. Things you can do on your own to prevent diseases and keep yourself healthy. What should I know about diet, weight, and exercise? Eat a healthy diet  Eat a diet that includes plenty of vegetables, fruits, low-fat dairy products, and lean protein. Do not eat a lot of foods that are high in solid fats, added sugars, or sodium. Maintain a healthy weight Body mass index (BMI) is a measurement that can be used to identify possible weight problems. It estimates body fat based on height and weight. Your health care provider can help determine your BMI and help you achieve or maintain a healthy weight. Get regular exercise Get regular exercise. This is one of the most important things you can do for your health. Most adults should: Exercise for at least 150 minutes each week. The exercise should increase your heart rate and make you sweat (moderate-intensity exercise). Do strengthening exercises at least twice a week. This is in addition to the moderate-intensity exercise. Spend less time sitting. Even light physical activity can be beneficial. Watch cholesterol and blood lipids Have your blood tested for lipids and cholesterol at 86 years of age, then have this test every 5 years. You may need to have your cholesterol levels checked more often if: Your lipid or cholesterol levels are high. You are older than 86 years of age. You are at high risk for heart disease. What should I know about cancer screening? Many types of cancers can be detected early and may often be prevented. Depending on your health history and family history, you may need to have cancer screening at various ages. This may include screening for: Colorectal cancer. Prostate cancer. Skin cancer. Lung  cancer. What should I know about heart disease, diabetes, and high blood pressure? Blood pressure and heart disease High blood pressure causes heart disease and increases the risk of stroke. This is more likely to develop in people who have high blood pressure readings or are overweight. Talk with your health care provider about your target blood pressure readings. Have your blood pressure checked: Every 3-5 years if you are 18-39 years of age. Every year if you are 40 years old or older. If you are between the ages of 65 and 75 and are a current or former smoker, ask your health care provider if you should have a one-time screening for abdominal aortic aneurysm (AAA). Diabetes Have regular diabetes screenings. This checks your fasting blood sugar level. Have the screening done: Once every three years after age 45 if you are at a normal weight and have a low risk for diabetes. More often and at a younger age if you are overweight or have a high risk for diabetes. What should I know about preventing infection? Hepatitis B If you have a higher risk for hepatitis B, you should be screened for this virus. Talk with your health care provider to find out if you are at risk for hepatitis B infection. Hepatitis C Blood testing is recommended for: Everyone born from 1945 through 1965. Anyone with known risk factors for hepatitis C. Sexually transmitted infections (STIs) You should be screened each year for STIs, including gonorrhea and chlamydia, if: You are sexually active and are younger than 86 years of age. You are older than 86 years of age and your   health care provider tells you that you are at risk for this type of infection. Your sexual activity has changed since you were last screened, and you are at increased risk for chlamydia or gonorrhea. Ask your health care provider if you are at risk. Ask your health care provider about whether you are at high risk for HIV. Your health care provider  may recommend a prescription medicine to help prevent HIV infection. If you choose to take medicine to prevent HIV, you should first get tested for HIV. You should then be tested every 3 months for as long as you are taking the medicine. Follow these instructions at home: Alcohol use Do not drink alcohol if your health care provider tells you not to drink. If you drink alcohol: Limit how much you have to 0-2 drinks a day. Know how much alcohol is in your drink. In the [place] one drink equals one 12 oz bottle of beer (355 mL), one 5 oz glass of wine (148 mL), or one 1 oz glass of hard liquor (44 mL). Lifestyle Do not use any products that contain nicotine or tobacco. These products include cigarettes, chewing tobacco, and vaping devices, such as e-cigarettes. If you need help quitting, ask your health care provider. Do not use street drugs. Do not share needles. Ask your health care provider for help if you need support or information about quitting drugs. General instructions Schedule regular health, dental, and eye exams. Stay current with your vaccines. Tell your health care provider if: You often feel depressed. You have ever been abused or do not feel safe at home. Summary Adopting a healthy lifestyle and getting preventive care are important in promoting health and wellness. Follow your health care provider's instructions about healthy diet, exercising, and getting tested or screened for diseases. Follow your health care provider's instructions on monitoring your cholesterol and blood pressure. This information is not intended to replace advice given to you by your health care provider. Make sure you discuss any questions you have with your health care provider. Document Revised: 03/12/2021 Document Reviewed: 03/12/2021 Elsevier Patient Education  2023 Elsevier Inc.  

## 2022-09-19 ENCOUNTER — Ambulatory Visit (INDEPENDENT_AMBULATORY_CARE_PROVIDER_SITE_OTHER): Payer: Federal, State, Local not specified - PPO | Admitting: Family Medicine

## 2022-09-19 ENCOUNTER — Encounter: Payer: Self-pay | Admitting: Family Medicine

## 2022-09-19 VITALS — BP 143/70 | HR 63 | Temp 97.6°F | Ht 65.5 in | Wt 169.6 lb

## 2022-09-19 DIAGNOSIS — E039 Hypothyroidism, unspecified: Secondary | ICD-10-CM

## 2022-09-19 DIAGNOSIS — E7800 Pure hypercholesterolemia, unspecified: Secondary | ICD-10-CM | POA: Diagnosis not present

## 2022-09-19 DIAGNOSIS — Z0000 Encounter for general adult medical examination without abnormal findings: Secondary | ICD-10-CM | POA: Diagnosis not present

## 2022-09-19 DIAGNOSIS — I10 Essential (primary) hypertension: Secondary | ICD-10-CM | POA: Diagnosis not present

## 2022-09-19 LAB — COMPREHENSIVE METABOLIC PANEL
ALT: 15 U/L (ref 0–53)
AST: 17 U/L (ref 0–37)
Albumin: 4 g/dL (ref 3.5–5.2)
Alkaline Phosphatase: 68 U/L (ref 39–117)
BUN: 15 mg/dL (ref 6–23)
CO2: 33 mEq/L — ABNORMAL HIGH (ref 19–32)
Calcium: 8.8 mg/dL (ref 8.4–10.5)
Chloride: 106 mEq/L (ref 96–112)
Creatinine, Ser: 0.95 mg/dL (ref 0.40–1.50)
GFR: 72.58 mL/min (ref >60.00)
Glucose, Bld: 102 mg/dL — ABNORMAL HIGH (ref 70–99)
Potassium: 4.8 mEq/L (ref 3.5–5.1)
Sodium: 143 mEq/L (ref 135–145)
Total Bilirubin: 0.7 mg/dL (ref 0.2–1.2)
Total Protein: 6.4 g/dL (ref 6.0–8.3)

## 2022-09-19 LAB — CBC
HCT: 46.3 % (ref 39.0–52.0)
Hemoglobin: 15.3 g/dL (ref 13.0–17.0)
MCHC: 33 g/dL (ref 30.0–36.0)
MCV: 98.1 fl (ref 78.0–100.0)
Platelets: 201 10*3/uL (ref 150.0–400.0)
RBC: 4.72 Mil/uL (ref 4.22–5.81)
RDW: 14.3 % (ref 11.5–15.5)
WBC: 5.1 10*3/uL (ref 4.0–10.5)

## 2022-09-19 LAB — TSH: TSH: 9.85 u[IU]/mL — ABNORMAL HIGH (ref 0.35–5.50)

## 2022-09-19 MED ORDER — FLUTICASONE PROPIONATE 50 MCG/ACT NA SUSP: NASAL | 1 refills | Status: AC

## 2022-09-19 MED ORDER — ATORVASTATIN CALCIUM 10 MG PO TABS: 10.0000 mg | ORAL_TABLET | Freq: Every day | ORAL | 1 refills | Status: DC

## 2022-09-19 MED ORDER — LEVOTHYROXINE SODIUM 150 MCG PO TABS: ORAL_TABLET | ORAL | 1 refills | Status: DC

## 2022-09-19 NOTE — Progress Notes (Signed)
Office Note 09/19/2022  CC:  Chief Complaint  Patient presents with   Annual Exam    HPI:  Patient is a 88 y.o. male who is here for annual health maintenance exam and 53-monthfollow-up hypertension, hypothyroidism, and hyperlipidemia.  He feels well.  His knee does not bother him anymore. Home bps normal. He remains very active and vigorous.  No acute concerns.  Past Medical History:  Diagnosis Date   Adenomatous colon polyp 2012; 01/2016; 03/2019   Bx 2012 showed tubular adenoma but no HG dysplasia.  2017 : adenomatous.  03/2019: adenomatous->Recall 3 yrs vs no repeat (patient's choice per GI).   Asymptomatic cholelithiasis 2017   Carpal tunnel syndrome, bilateral    Diverticulosis 03/2019   Sigmoid (on colonoscopy)   Erectile dysfunction    Hearing loss of both ears    +hearing aids   HTN (hypertension)    Hypothyroidism    Increased prostate specific antigen (PSA) velocity 11/11/2011   Biopsy negative 03/11/12.  Followed by Dr. BAlinda Money(mild rise in PSA 2015, but Dr. BAlinda Moneyand pt agreed to continue with annual PSA/DRE, no invasive procedure).  PSA 4.19 04/2017.  Dr. BAlinda Moneyrecommended no further PSA testing, just annual DRE.   Pleural effusion associated with pulmonary infection 04/2014   PULM: benign parapneumonic effusion; resolved 05/2014   Pulmonary nodule 2015   with subcarinal adenopathy; stable on serial CT scans in 2015 and 09/2015, and 03/2016.  F/u CT 03/2017 stable---MAY BE CONSIDERED BENIGN.  NO FURTHER CT's needed.   Stress fracture of foot 2017   right    Past Surgical History:  Procedure Laterality Date   CARDIOVASCULAR STRESS TEST  07/06/14   NORMAL Myocardial perfusion imaging, normal EF, normal LV function, no wall motion abnormality   COLONOSCOPY W/ POLYPECTOMY  2012; 01/2016; 03/2019   2017 adenomatous polyp.  Rpt 03/2019->adenomatous polyp->rpt 3 yrs OR no further due to aging out->pt's choice per GI   KNEE SURGERY     2009, laperoscopic    TONSILLECTOMY  1970   TRIGGER FINGER INJECTION Left 081/11/7508  UMBILICAL HERNIA REPAIR  1980    Family History  Problem Relation Age of Onset   Hypertension Mother    Hypertension Father     Social History   Socioeconomic History   Marital status: Married    Spouse name: DHilda Blades  Number of children: Not on file   Years of education: Not on file   Highest education level: 12th grade  Occupational History   Occupation: Retired pActor  Tobacco Use   Smoking status: Former    Packs/day: 0.25    Years: 40.00    Total pack years: 10.00    Types: Cigarettes    Quit date: 11/04/1988    Years since quitting: 33.8   Smokeless tobacco: Never  Substance and Sexual Activity   Alcohol use: No    Comment: quit in 1990    Drug use: No   Sexual activity: Not on file  Other Topics Concern   Not on file  Social History Narrative   Divorced, remarried, one son and one step daughter (step-daughter is local), 2 grandchildren.   [place]   Retired lQuarry managercarrier.   Distant tobacco abuse: avg 5 cigs day for 20 yrs on and off, quit for good around 1990.   No alcohol.   Daily walking at the park.   Social Determinants of Health   Financial Resource Strain: Not on file  Food Insecurity: No Food Insecurity (03/15/2022)   Hunger Vital Sign    Worried About Running Out of Food in the Last Year: Never true    Ran Out of Food in the Last Year: Never true  Transportation Needs: No Transportation Needs (03/15/2022)   PRAPARE - Hydrologist (Medical): No    Lack of Transportation (Non-Medical): No  Physical Activity: Sufficiently Active (03/15/2022)   Exercise Vital Sign    Days of Exercise per Week: 7 days    Minutes of Exercise per Session: 90 min  Stress: No Stress Concern Present (03/15/2022)   [place]    Feeling of Stress : Not at all  Social Connections:  Moderately Integrated (03/15/2022)   Social Connection and Isolation Panel [NHANES]    Frequency of Communication with Friends and Family: More than three times a week    Frequency of Social Gatherings with Friends and Family: More than three times a week    Attends Religious Services: 1 to 4 times per year    Active Member of [employer] or Organizations: No    Attends Music therapist: Not on file    Marital Status: Married  Human resources officer Violence: Not on file    Outpatient Medications Prior to Visit  Medication Sig Dispense Refill   atorvastatin (LIPITOR) 10 MG tablet Take 1 tablet (10 mg total) by mouth daily. 90 tablet 0   benzonatate (TESSALON) 100 MG capsule Take 1 capsule (100 mg total) by mouth 3 (three) times daily as needed for cough. 30 capsule 0   calcium carbonate (OS-CAL) 600 MG TABS tablet Take 600 mg by mouth daily. Reported on 02/07/2016     fluticasone (FLONASE) 50 MCG/ACT nasal spray SHAKE LIQUID AND USE 2 SPRAYS IN EACH NOSTRIL DAILY 48 g 1   ibuprofen (ADVIL,MOTRIN) 800 MG tablet Take 800 mg by mouth every 8 (eight) hours as needed for mild pain. Reported on 02/07/2016     levothyroxine (SYNTHROID) 150 MCG tablet TAKE 1/2 TAB MONDAY THRU FRIDAY AND WHOLE TABLET ON SATURDAYS AND SUNDAYS 90 tablet 1   loratadine (CLARITIN) 10 MG tablet Take 10 mg by mouth daily. Reported on 02/07/2016     meloxicam (MOBIC) 15 MG tablet Take 1 tablet (15 mg total) by mouth daily. 10 tablet 0   Multiple Vitamins-Minerals (CENTRUM SILVER PO) Take 1 tablet by mouth daily. Reported on 02/07/2016     niacin 500 MG tablet Take 500 mg by mouth daily with breakfast. Reported on 02/07/2016     Omega-3 Fatty Acids (FISH OIL) 1000 MG CAPS Take 1 capsule by mouth daily. Reported on 02/07/2016     pantoprazole (PROTONIX) 40 MG tablet Take 1 tablet (40 mg total) by mouth daily. 30 tablet 3   Psyllium (METAMUCIL PO) Take 1 scoop by mouth daily. Reported on 02/07/2016     No facility-administered medications  prior to visit.    No Known Allergies  ROS Review of Systems  Constitutional:  Negative for appetite change, chills, fatigue and fever.  HENT:  Negative for congestion, dental problem, ear pain and sore throat.   Eyes:  Negative for discharge, redness and visual disturbance.  Respiratory:  Negative for cough, chest tightness, shortness of breath and wheezing.   Cardiovascular:  Negative for chest pain, palpitations and leg swelling.  Gastrointestinal:  Negative for abdominal pain, blood in stool, diarrhea, nausea and vomiting.  Genitourinary:  Negative for difficulty urinating, dysuria,  flank pain, frequency, hematuria and urgency.  Musculoskeletal:  Negative for arthralgias, back pain, joint swelling, myalgias and neck stiffness.  Skin:  Negative for pallor and rash.  Neurological:  Negative for dizziness, speech difficulty, weakness and headaches.  Hematological:  Negative for adenopathy. Does not bruise/bleed easily.  Psychiatric/Behavioral:  Negative for confusion and sleep disturbance. The patient is not nervous/anxious.     PE;    09/19/2022    7:53 AM 08/22/2022   10:44 AM 08/16/2022   10:40 AM  Vitals with BMI  Height 5' 5.5" 5' 5.5" 5' 5.5"  Weight 169 lbs 10 oz 174 lbs 3 oz 171 lbs 13 oz  BMI 27.78 47.65 46.50  Systolic 354 656 812  Diastolic 62 66 65  Pulse 63 69 80    Gen: Alert, well appearing.  Patient is oriented to person, place, time, and situation. AFFECT: pleasant, lucid thought and speech. ENT: Ears: EACs clear, normal epithelium.  TMs with good light reflex and landmarks bilaterally.  Eyes: no injection, icteris, swelling, or exudate.  EOMI, PERRLA. Nose: no drainage or turbinate edema/swelling.  No injection or focal lesion.  Mouth: lips without lesion/swelling.  Oral mucosa pink and moist.  Dentition intact and without obvious caries or gingival swelling.  Oropharynx without erythema, exudate, or swelling.  Neck: supple/nontender.  No LAD, mass, or TM.   Carotid pulses 2+ bilaterally, without bruits. CV: RRR, no m/r/g.   LUNGS: CTA bilat, nonlabored resps, good aeration in all lung fields. ABD: soft, NT, ND, BS normal.  No hepatospenomegaly or mass.  No bruits. EXT: no clubbing, cyanosis, or edema.  Just minimal discomfort to palpation over pes anserine region on R. Musculoskeletal: no joint swelling, erythema, warmth, or tenderness.  ROM of all joints intact. Skin - no sores or suspicious lesions or rashes or color changes  Pertinent labs:  Lab Results  Component Value Date   TSH 4.95 03/19/2022   Lab Results  Component Value Date   WBC 5.1 09/19/2021   HGB 15.1 09/19/2021   HCT 45.8 09/19/2021   MCV 95.5 09/19/2021   PLT 202.0 09/19/2021   Lab Results  Component Value Date   CREATININE 1.01 03/19/2022   BUN 19 03/19/2022   NA 143 03/19/2022   K 4.2 03/19/2022   CL 107 03/19/2022   CO2 29 03/19/2022   Lab Results  Component Value Date   ALT 13 03/19/2022   AST 16 03/19/2022   ALKPHOS 69 03/19/2022   BILITOT 0.8 03/19/2022   Lab Results  Component Value Date   CHOL 129 03/19/2022   Lab Results  Component Value Date   HDL 43.80 03/19/2022   Lab Results  Component Value Date   LDLCALC 71 03/19/2022   Lab Results  Component Value Date   TRIG 72.0 03/19/2022   Lab Results  Component Value Date   CHOLHDL 3 03/19/2022   Lab Results  Component Value Date   PSA 3.94 11/11/2011   PSA 2.61 12/17/2010   Lab Results  Component Value Date   HGBA1C 5.4 03/25/2022   ASSESSMENT AND PLAN:   #1 health maintenance exam: Reviewed age and gender appropriate health maintenance issues (prudent diet, regular exercise, health risks of tobacco and excessive alcohol, use of seatbelts, fire alarms in home, use of sunscreen).  Also reviewed age and gender appropriate health screening as well as vaccine recommendations. Vaccines: ALL UTD. Labs: fasting HP labs ordered. Prostate ca screening: no further prostate ca screening  indicated due to age. Colon  ca screening: Per patient report a repeat colonoscopy this year with digestive health revealed some polyps, benign.  Will get records.  #2 hypertension, history of. Home blood pressures normal.  #3 hypercholesterolemia, doing well on atorvastatin 10 mg a day. Lipid panel today.  #4 Hypoth: Takes T4 on empty stomach w/out any other meds. Continue 150 mcg tabs: Half tab Monday through Friday and whole tab on Saturdays and 'Sundays. TSH today.  #5 right knee pes anserine bursitis. Resolved.  An After Visit Summary was printed and given to the patient.  FOLLOW UP:  Return in about 6 months (around 03/20/2023) for routine chronic illness f/u.  Signed:  Phil Anjali Manzella, MD           11'$ /16/2023

## 2022-09-20 LAB — LIPID PANEL
Cholesterol: 138 mg/dL (ref 0–200)
HDL: 52.4 mg/dL (ref >39.00)
LDL Cholesterol: 73 mg/dL (ref 0–99)
NonHDL: 85.92
Total CHOL/HDL Ratio: 3
Triglycerides: 66 mg/dL (ref 0.0–149.0)
VLDL: 13.2 mg/dL (ref 0.0–40.0)

## 2022-09-23 ENCOUNTER — Telehealth: Payer: Self-pay

## 2022-09-23 DIAGNOSIS — E039 Hypothyroidism, unspecified: Secondary | ICD-10-CM

## 2022-09-23 MED ORDER — LEVOTHYROXINE SODIUM 150 MCG PO TABS: ORAL_TABLET | ORAL | 1 refills | Status: DC

## 2022-09-23 NOTE — Telephone Encounter (Signed)
-----   Message from Tammi Sou, MD sent at 09/22/2022  9:19 AM EST ----- Thyroid level a little low. Take whole tab on M/W/F and 1/2 tab all other days (150 mcg tabs). Recheck TSH 6 wks, dx acquired hypothyroidism.  All other labs normal.

## 2022-10-02 ENCOUNTER — Other Ambulatory Visit: Payer: Self-pay | Admitting: Family Medicine

## 2022-10-31 ENCOUNTER — Encounter: Payer: Self-pay | Admitting: Family Medicine

## 2022-11-04 DIAGNOSIS — R202 Paresthesia of skin: Secondary | ICD-10-CM

## 2022-11-04 HISTORY — DX: Paresthesia of skin: R20.2

## 2022-11-06 ENCOUNTER — Other Ambulatory Visit (INDEPENDENT_AMBULATORY_CARE_PROVIDER_SITE_OTHER): Payer: Federal, State, Local not specified - PPO

## 2022-11-06 DIAGNOSIS — E039 Hypothyroidism, unspecified: Secondary | ICD-10-CM | POA: Diagnosis not present

## 2022-11-06 LAB — TSH: TSH: 5.39 u[IU]/mL (ref 0.35–5.50)

## 2022-12-31 ENCOUNTER — Other Ambulatory Visit: Payer: Self-pay

## 2023-01-06 ENCOUNTER — Other Ambulatory Visit: Payer: Self-pay

## 2023-03-18 ENCOUNTER — Other Ambulatory Visit: Payer: Self-pay | Admitting: Family Medicine

## 2023-03-20 ENCOUNTER — Encounter: Payer: Self-pay | Admitting: Family Medicine

## 2023-03-20 ENCOUNTER — Ambulatory Visit: Payer: Federal, State, Local not specified - PPO | Admitting: Family Medicine

## 2023-03-20 VITALS — BP 138/70 | HR 67 | Wt 172.2 lb

## 2023-03-20 DIAGNOSIS — E039 Hypothyroidism, unspecified: Secondary | ICD-10-CM | POA: Diagnosis not present

## 2023-03-20 DIAGNOSIS — R030 Elevated blood-pressure reading, without diagnosis of hypertension: Secondary | ICD-10-CM

## 2023-03-20 DIAGNOSIS — E7800 Pure hypercholesterolemia, unspecified: Secondary | ICD-10-CM

## 2023-03-20 DIAGNOSIS — R202 Paresthesia of skin: Secondary | ICD-10-CM

## 2023-03-20 LAB — COMPREHENSIVE METABOLIC PANEL
ALT: 10 U/L (ref 0–53)
AST: 16 U/L (ref 0–37)
Albumin: 3.9 g/dL (ref 3.5–5.2)
Alkaline Phosphatase: 69 U/L (ref 39–117)
BUN: 15 mg/dL (ref 6–23)
CO2: 29 mEq/L (ref 19–32)
Calcium: 8.9 mg/dL (ref 8.4–10.5)
Chloride: 106 mEq/L (ref 96–112)
Creatinine, Ser: 0.99 mg/dL (ref 0.40–1.50)
GFR: 68.84 mL/min (ref >60.00)
Glucose, Bld: 98 mg/dL (ref 70–99)
Potassium: 4.4 mEq/L (ref 3.5–5.1)
Sodium: 143 mEq/L (ref 135–145)
Total Bilirubin: 0.9 mg/dL (ref 0.2–1.2)
Total Protein: 6.4 g/dL (ref 6.0–8.3)

## 2023-03-20 LAB — LIPID PANEL
Cholesterol: 132 mg/dL (ref 0–200)
HDL: 42.3 mg/dL (ref >39.00)
LDL Cholesterol: 72 mg/dL (ref 0–99)
NonHDL: 90.12
Total CHOL/HDL Ratio: 3
Triglycerides: 93 mg/dL (ref 0.0–149.0)
VLDL: 18.6 mg/dL (ref 0.0–40.0)

## 2023-03-20 LAB — VITAMIN B12: Vitamin B-12: 1072 pg/mL — ABNORMAL HIGH (ref 211–911)

## 2023-03-20 LAB — TSH: TSH: 9.82 u[IU]/mL — ABNORMAL HIGH (ref 0.35–5.50)

## 2023-03-20 MED ORDER — LEVOTHYROXINE SODIUM 150 MCG PO TABS: ORAL_TABLET | ORAL | 1 refills | Status: DC

## 2023-03-20 MED ORDER — GABAPENTIN 100 MG PO CAPS: ORAL_CAPSULE | ORAL | 0 refills | Status: DC

## 2023-03-20 NOTE — Progress Notes (Signed)
OFFICE VISIT  03/20/2023  CC:  Chief Complaint  Patient presents with   Medical Management of Chronic Issues    Pt is fasting.   Patient is a 89 y.o. male who presents for 30-month follow-up hypertension (hx of), hyperlipidemia, and hypothyroidism. A/P as of last visit: "1 hypertension, history of. Home blood pressures normal.   #2 hypercholesterolemia, doing well on atorvastatin 10 mg a day. Lipid panel today.   #3 Hypoth: Takes T4 on empty stomach w/out any other meds. Continue 150 mcg tabs: Half tab Monday through Friday and whole tab on Saturdays and Sundays. TSH today.   #4 right knee pes anserine bursitis. Resolved."  INTERIM HX: Joel Kelley has had about a 2-month history of feeling burning in the distal half of all fingers only at night.  No weakness.  No color changes.  No numbness.  No wrist pain.  No fingers swelling. No daytime symptoms at all.  No symptom in his feet or toes. Denies any neck pain.  ROS as above, plus--> no fevers, no CP, no SOB, no wheezing, no cough, no dizziness, no HAs, no rashes, no melena/hematochezia.  No polyuria or polydipsia.  No myalgias or arthralgias.  No focal weakness or tremors.  No acute vision or hearing abnormalities.  No dysuria or unusual/new urinary urgency or frequency.  No recent changes in lower legs. No n/v/d or abd pain.  No palpitations.     Past Medical History:  Diagnosis Date   Adenomatous colon polyp 2012; 01/2016; 03/2019   Bx 2012 showed tubular adenoma but no HG dysplasia.  2017 : adenomatous.  03/2019: adenomatous.  04/2022 adenoma x 3   Asymptomatic cholelithiasis 2017   Carpal tunnel syndrome, bilateral    Diverticulosis 03/2019   Sigmoid (on colonoscopy)   Erectile dysfunction    Hearing loss of both ears    +hearing aids   HTN (hypertension)    Hypothyroidism    Increased prostate specific antigen (PSA) velocity 11/11/2011   Biopsy negative 03/11/12.  Followed by Dr. Borden (mild rise in PSA 2015, but Dr. Borden  and pt agreed to continue with annual PSA/DRE, no invasive procedure).  PSA 4.19 04/2017.  Dr. Borden recommended no further PSA testing, just annual DRE.   Pleural effusion associated with pulmonary infection 04/2014   PULM: benign parapneumonic effusion; resolved 05/2014   Pulmonary nodule 2015   with subcarinal adenopathy; stable on serial CT scans in 2015 and 09/2015, and 03/2016.  F/u CT 03/2017 stable---MAY BE CONSIDERED BENIGN.  NO FURTHER CT\'s needed.   Stress fracture of foot 2017   right    Past Surgical History:  Procedure Laterality Date   CARDIOVASCULAR STRESS TEST  07/06/2014   NORMAL Myocardial perfusion imaging, normal EF, normal LV function, no wall motion abnormality   COLONOSCOPY W/ POLYPECTOMY  2012; 01/2016; 03/2019   2017 adenomatous polyp.  Rpt 03/2019->adenomatous polyp.  04/2022 adenoma x 3   KNEE SURGERY     20 09, laperoscopic   TONSILLECTOMY  1970   TRIGGER FINGER INJECTION Left 03/29/2020   UMBILICAL HERNIA REPAIR  1980    Outpatient Medications Prior to Visit  Medication Sig Dispense Refill   atorvastatin (LIPITOR) 10 MG tablet Take 1 tablet (10 mg total) by mouth daily. 90 tablet 1   benzonatate (TESSALON) 100 MG capsule Take 1 capsule (100 mg total) by mouth 3 (three) times daily as needed for cough. 30 capsule 0   calcium carbonate (OS-CAL) 600 MG TABS tablet Take 600 mg by mouth  daily. Reported on 02/07/2016     fluticasone (FLONASE) 50 MCG/ACT nasal spray SHAKE LIQUID AND USE 2 SPRAYS IN EACH NOSTRIL DAILY 48 g 1   ibuprofen (ADVIL,MOTRIN) 800 MG tablet Take 800 mg by mouth every 8 (eight) hours as needed for mild pain. Reported on 02/07/2016     levothyroxine (SYNTHROID) 150 MCG tablet TAKE 1 WHOLE TABLET ON MONDAY/WEDNESDAY/FRIDAY AND 1/2 TABLET ALL OTHER DAYS. 90 tablet 1   loratadine (CLARITIN) 10 MG tablet Take 10 mg by mouth daily. Reported on 02/07/2016     meloxicam (MOBIC) 15 MG tablet Take 1 tablet (15 mg total) by mouth daily. 10 tablet 0   Multiple  Vitamins-Minerals (CENTRUM SILVER PO) Take 1 tablet by mouth daily. Reported on 02/07/2016     niacin 500 MG tablet Take 500 mg by mouth daily with breakfast. Reported on 02/07/2016     Omega-3 Fatty Acids (FISH OIL) 1000 MG CAPS Take 1 capsule by mouth daily. Reported on 02/07/2016     pantoprazole (PROTONIX) 40 MG tablet Take 1 tablet (40 mg total) by mouth daily. 30 tablet 3   Psyllium (METAMUCIL PO) Take 1 scoop by mouth daily. Reported on 02/07/2016     No facility-administered medications prior to visit.    No Known Allergies  Review of Systems As per HPI  PE:    03/20/2023    9:58 AM 03/20/2023    9:46 AM 09/19/2022    8:18 AM  Vitals with BMI  Weight  172 lbs 3 oz   Systolic 138 149 161  Diastolic 70 78 70  Pulse  67    Initial blood pressure today 149/78. Repeat was 138/70.  Physical Exam  Gen: Alert, well appearing.  Patient is oriented to person, place, time, and situation. AFFECT: pleasant, lucid thought and speech. Wrists and hands without erythema, warmth, or swelling.  He has mild general bony hypertrophy of all the knuckles of his hands. No angulation deformities. Range of motion fully intact.  He has slight fixed flexion contracture and firm focal nodularity palpable over the MCP joint of the middle finger.  No triggering.  Sensation intact.  Strength 5 out of 5 bilateral.  LABS:  Last CBC Lab Results  Component Value Date   WBC 5.1 09/19/2022   HGB 15.3 09/19/2022   HCT 46.3 09/19/2022   MCV 98.1 09/19/2022   MCH 32.6 03/23/2014   RDW 14.3 09/19/2022   PLT 201.0 09/19/2022   Last metabolic panel Lab Results  Component Value Date   GLUCOSE 102 (H) 09/19/2022   NA 143 09/19/2022   K 4.8 09/19/2022   CL 106 09/19/2022   CO2 33 (H) 09/19/2022   BUN 15 09/19/2022   CREATININE 0.95 09/19/2022   GFRNONAA 78 (L) 03/23/2014   CALCIUM 8.8 09/19/2022   PROT 6.4 09/19/2022   ALBUMIN 4.0 09/19/2022   BILITOT 0.7 09/19/2022   ALKPHOS 68 09/19/2022   AST 17  09/19/2022   ALT 15 09/19/2022   Last lipids Lab Results  Component Value Date   CHOL 138 09/19/2022   HDL 52.40 09/19/2022   LDLCALC 73 09/19/2022   TRIG 66.0 09/19/2022   CHOLHDL 3 09/19/2022   Last hemoglobin A1c Lab Results  Component Value Date   HGBA1C 5.4 03/25/2022   Last thyroid functions Lab Results  Component Value Date   TSH 5.39 11/06/2022   IMPRESSION AND PLAN:  #1 burning paresthesias in all fingers, nighttime only. Unknown etiology. Discussed possible symptomatic treatment versus referral to  neurologist for workup. Decided on trial of gabapentin 100 mg nightly and increase to 200 mg nightly in 1 week if not significantly improved.  #2 hypertension, history of.  His blood pressure remains normal off of medication.  3.  Hypercholesterolemia, doing well on atorvastatin 10 mg a day. Lipid panel and complete metabolic panel today.  4.  Hypothyroidism. Takes T4 on empty stomach w/out any other meds. Continue 150 mcg tabs: Half tab Monday through Friday and whole tab on Saturdays and Sundays. TSH today.  An After Visit Summary was printed and given to the patient.  FOLLOW UP: No follow-ups on file. Next CPE 09/20/2023  Signed:  Santiago Bumpers, MD           03/20/2023

## 2023-03-20 NOTE — Patient Instructions (Signed)
Take ONE of the gabapentin tabs around bedtime every night for 1 wk. If no significant improvement in burning fingers at that time then increase to 2 tabs every night.

## 2023-03-21 ENCOUNTER — Encounter: Payer: Self-pay | Admitting: Family Medicine

## 2023-03-27 ENCOUNTER — Other Ambulatory Visit: Payer: Self-pay

## 2023-03-27 MED ORDER — MELOXICAM 15 MG PO TABS: 15.0000 mg | ORAL_TABLET | Freq: Every day | ORAL | 0 refills | Status: AC

## 2023-04-15 NOTE — Patient Instructions (Signed)

## 2023-04-17 ENCOUNTER — Ambulatory Visit: Payer: Federal, State, Local not specified - PPO | Admitting: Family Medicine

## 2023-04-17 ENCOUNTER — Encounter: Payer: Self-pay | Admitting: Family Medicine

## 2023-04-17 VITALS — BP 128/73 | HR 71 | Wt 172.2 lb

## 2023-04-17 DIAGNOSIS — E039 Hypothyroidism, unspecified: Secondary | ICD-10-CM | POA: Diagnosis not present

## 2023-04-17 DIAGNOSIS — R202 Paresthesia of skin: Secondary | ICD-10-CM

## 2023-04-17 LAB — TSH: TSH: 8.57 u[IU]/mL — ABNORMAL HIGH (ref 0.35–5.50)

## 2023-04-17 MED ORDER — GABAPENTIN 300 MG PO CAPS: 300.0000 mg | ORAL_CAPSULE | Freq: Every day | ORAL | 1 refills | Status: DC

## 2023-04-17 NOTE — Progress Notes (Signed)
OFFICE VISIT  04/17/2023  CC:  Chief Complaint  Patient presents with   Hand Paraesthesia     F/u. Doing better.     Patient is a 89 y.o. male who presents for 1 month follow-up hand paresthesias. A/P as of last visit: "1 burning paresthesias in all fingers, nighttime only. Unknown etiology. Discussed possible symptomatic treatment versus referral to neurologist for workup. Decided on trial of gabapentin 100 mg nightly and increase to 200 mg nightly in 1 week if not significantly improved.   #2 hypertension, history of.  His blood pressure remains normal off of medication.   3.  Hypercholesterolemia, doing well on atorvastatin 10 mg a day. Lipid panel and complete metabolic panel today.   4.  Hypothyroidism. Takes T4 on empty stomach w/out any other meds. Continue 150 mcg tabs: Half tab Monday through Friday and whole tab on Saturdays and Sundays. TSH today."  INTERIM HX: Joel Kelley feels better.  He feels like his fingers are 80% improved with taking 2 of the gabapentin 100 mg tabs around bedtime.  No side effects. Again, the burning and numbness sensation is felt in the distal half all fingers on the palmar aspect only (bilat).  Labs last visit were all normal except TSH was a little high. I recommended he take his 150 mcg levothyroxine whole tab daily alt with half tab a day.   ROS as above, plus--> No fatigue, no heat or cold intol, no fevers, no dizziness, no HAs, No myalgias or arthralgias.  No focal weakness or tremors.  No acute vision or hearing abnormalities.  No recent changes in legs.  Past Medical History:  Diagnosis Date   Adenomatous colon polyp 2012; 01/2016; 03/2019   Bx 2012 showed tubular adenoma but no HG dysplasia.  2017 : adenomatous.  03/2019: adenomatous.  04/2022 adenoma x 3   Asymptomatic cholelithiasis 2017   Carpal tunnel syndrome, bilateral    Diverticulosis 03/2019   Sigmoid (on colonoscopy)   Erectile dysfunction    Flexion contracture of joint of  hand, right    ?dupuytrens? Asymptomatic   Hearing loss of both ears    +hearing aids   HTN (hypertension)    Hypothyroidism    Increased prostate specific antigen (PSA) velocity 11/11/2011   Biopsy negative 03/11/12.  Followed by Dr. Borden (mild rise in PSA 2015, but Dr. Borden and pt agreed to continue with annual PSA/DRE, no invasive procedure).  PSA 4.19 04/2017.  Dr. Borden recommended no further PSA testing, just annual DRE.   Pleural effusion associated with pulmonary infection 04/2014   PULM: benign parapneumonic effusion; resolved 05/2014   Pulmonary nodule 2015   with subcarinal adenopathy; stable on serial CT scans in 2015 and 09/2015, and 03/2016.  F/u CT 03/2017 stable---MAY BE CONSIDERED BENIGN.  NO FURTHER CT\'s needed.   Stress fracture of foot 2017   right    Past Surgical History:  Procedure Laterality Date   CARDIOVASCULAR STRESS TEST  07/06/2014   NORMAL Myocardial perfusion imaging, normal EF, normal LV function, no wall motion abnormality   COLONOSCOPY W/ POLYPECTOMY  2012; 01/2016; 03/2019   2017 adenomatous polyp.  Rpt 03/2019->adenomatous polyp.  04/2022 adenoma x 3   KNEE SURGERY     20 09, laperoscopic   TONSILLECTOMY  1970   TRIGGER FINGER INJECTION Left 03/29/2020   UMBILICAL HERNIA REPAIR  1980    Outpatient Medications Prior to Visit  Medication Sig Dispense Refill   atorvastatin (LIPITOR) 10 MG tablet Take 1 tablet (10 mg  total) by mouth daily. 90 tablet 1   benzonatate (TESSALON) 100 MG capsule Take 1 capsule (100 mg total) by mouth 3 (three) times daily as needed for cough. 30 capsule 0   calcium carbonate (OS-CAL) 600 MG TABS tablet Take 600 mg by mouth daily. Reported on 02/07/2016     fluticasone (FLONASE) 50 MCG/ACT nasal spray SHAKE LIQUID AND USE 2 SPRAYS IN EACH NOSTRIL DAILY 48 g 1   ibuprofen (ADVIL,MOTRIN) 800 MG tablet Take 800 mg by mouth every 8 (eight) hours as needed for mild pain. Reported on 02/07/2016     levothyroxine (SYNTHROID) 150 MCG  tablet TAKE 1 WHOLE TABLET ON MONDAY/WEDNESDAY/FRIDAY AND 1/2 TABLET ALL OTHER DAYS. 90 tablet 1   loratadine (CLARITIN) 10 MG tablet Take 10 mg by mouth daily. Reported on 02/07/2016     meloxicam (MOBIC) 15 MG tablet Take 1 tablet (15 mg total) by mouth daily. 10 tablet 0   Multiple Vitamins-Minerals (CENTRUM SILVER PO) Take 1 tablet by mouth daily. Reported on 02/07/2016     niacin 500 MG tablet Take 500 mg by mouth daily with breakfast. Reported on 02/07/2016     Omega-3 Fatty Acids (FISH OIL) 1000 MG CAPS Take 1 capsule by mouth daily. Reported on 02/07/2016     pantoprazole (PROTONIX) 40 MG tablet Take 1 tablet (40 mg total) by mouth daily. 30 tablet 3   Psyllium (METAMUCIL PO) Take 1 scoop by mouth daily. Reported on 02/07/2016     gabapentin (NEURONTIN) 100 MG capsule 2 tabs po qhs 60 capsule 0   No facility-administered medications prior to visit.    No Known Allergies  Review of Systems As per HPI  PE:    04/17/2023    8:46 AM 03/20/2023    9:58 AM 03/20/2023    9:46 AM  Vitals with BMI  Weight 172 lbs 3 oz  172 lbs 3 oz  Systolic 128 138 409  Diastolic 73 70 78  Pulse 71  67     Physical Exam  Gen: Alert, well appearing.  Patient is oriented to person, place, time, and situation. AFFECT: pleasant, lucid thought and speech. Hands warm and pink with normal range of motion.  No sensory deficit.  LABS:  Last CBC Lab Results  Component Value Date   WBC 5.1 09/19/2022   HGB 15.3 09/19/2022   HCT 46.3 09/19/2022   MCV 98.1 09/19/2022   MCH 32.6 03/23/2014   RDW 14.3 09/19/2022   PLT 201.0 09/19/2022   Last metabolic panel Lab Results  Component Value Date   GLUCOSE 98 03/20/2023   NA 143 03/20/2023   K 4.4 03/20/2023   CL 106 03/20/2023   CO2 29 03/20/2023   BUN 15 03/20/2023   CREATININE 0.99 03/20/2023   GFRNONAA 78 (L) 03/23/2014   CALCIUM 8.9 03/20/2023   PROT 6.4 03/20/2023   ALBUMIN 3.9 03/20/2023   BILITOT 0.9 03/20/2023   ALKPHOS 69 03/20/2023   AST 16  03/20/2023   ALT 10 03/20/2023   Last lipids Lab Results  Component Value Date   CHOL 132 03/20/2023   HDL 42.30 03/20/2023   LDLCALC 72 03/20/2023   TRIG 93.0 03/20/2023   CHOLHDL 3 03/20/2023   Last hemoglobin A1c Lab Results  Component Value Date   HGBA1C 5.4 03/25/2022   Last thyroid functions Lab Results  Component Value Date   TSH 9.82 (H) 03/20/2023   Last vitamin B12 and Folate Lab Results  Component Value Date   VITAMINB12 1,072 (  H) 03/20/2023   IMPRESSION AND PLAN:  #1 paresthesias/neuropathy. Significantly improved on 200 mg gabapentin nightly. Will increase to a 300 mg gabapentin dose every night.  #2 hypothyroidism.  Most recent TSH 9.82.  We adjusted the dose 1 month ago-->150 mcg levothyroxine whole tab daily alt with half tab a day.  Monitor TSH today.  An After Visit Summary was printed and given to the patient.  FOLLOW UP: Return in about 6 months (around 10/17/2023) for annual CPE (fasting). Next CPE 09/20/2023  Signed:  Santiago Bumpers, MD           04/17/2023

## 2023-04-17 NOTE — Addendum Note (Signed)
Addended by: Emi Holes D on: 04/17/2023 09:19 AM   Modules accepted: Orders

## 2023-04-18 ENCOUNTER — Other Ambulatory Visit: Payer: Self-pay | Admitting: Family Medicine

## 2023-04-18 DIAGNOSIS — E039 Hypothyroidism, unspecified: Secondary | ICD-10-CM

## 2023-04-18 MED ORDER — LEVOTHYROXINE SODIUM 137 MCG PO CAPS: ORAL_CAPSULE | ORAL | 1 refills | Status: DC

## 2023-04-21 ENCOUNTER — Other Ambulatory Visit: Payer: Self-pay

## 2023-04-21 ENCOUNTER — Telehealth: Payer: Self-pay

## 2023-04-21 MED ORDER — LEVOTHYROXINE SODIUM 137 MCG PO CAPS: ORAL_CAPSULE | ORAL | 1 refills | Status: DC

## 2023-04-21 NOTE — Telephone Encounter (Signed)
Spoke with patient regarding results/recommendations.  

## 2023-04-21 NOTE — Telephone Encounter (Signed)
Patient rec'd mychart message from Dr. Milinda Cave that he was going to increase thyroid medicine.  Patient went to pharmacy yesterday and pharmacy did not have a new prescription.  Please resend to [facility] - [place]  Levothyroxine Sodium 137 MCG CAPS    Please call patient when completed.

## 2023-04-21 NOTE — Telephone Encounter (Signed)
RX resent

## 2023-06-24 ENCOUNTER — Other Ambulatory Visit: Payer: Self-pay

## 2023-06-24 MED ORDER — ATORVASTATIN CALCIUM 10 MG PO TABS: 10.0000 mg | ORAL_TABLET | Freq: Every day | ORAL | 0 refills | Status: DC

## 2023-08-06 ENCOUNTER — Encounter: Payer: Self-pay | Admitting: Family Medicine

## 2023-08-06 ENCOUNTER — Ambulatory Visit: Payer: Federal, State, Local not specified - PPO | Admitting: Family Medicine

## 2023-08-06 VITALS — BP 157/84 | HR 64 | Wt 172.2 lb

## 2023-08-06 DIAGNOSIS — R030 Elevated blood-pressure reading, without diagnosis of hypertension: Secondary | ICD-10-CM

## 2023-08-06 DIAGNOSIS — E039 Hypothyroidism, unspecified: Secondary | ICD-10-CM

## 2023-08-06 DIAGNOSIS — Z23 Encounter for immunization: Secondary | ICD-10-CM

## 2023-08-06 DIAGNOSIS — R42 Dizziness and giddiness: Secondary | ICD-10-CM

## 2023-08-06 LAB — CBC
HCT: 46.1 % (ref 39.0–52.0)
Hemoglobin: 14.9 g/dL (ref 13.0–17.0)
MCHC: 32.4 g/dL (ref 30.0–36.0)
MCV: 96.4 fL (ref 78.0–100.0)
Platelets: 199 10*3/uL (ref 150.0–400.0)
RBC: 4.78 Mil/uL (ref 4.22–5.81)
RDW: 14.2 % (ref 11.5–15.5)
WBC: 5.7 10*3/uL (ref 4.0–10.5)

## 2023-08-06 LAB — BASIC METABOLIC PANEL
BUN: 16 mg/dL (ref 6–23)
CO2: 29 meq/L (ref 19–32)
Calcium: 8.9 mg/dL (ref 8.4–10.5)
Chloride: 107 meq/L (ref 96–112)
Creatinine, Ser: 0.87 mg/dL (ref 0.40–1.50)
GFR: 77.76 mL/min (ref >60.00)
Glucose, Bld: 106 mg/dL — ABNORMAL HIGH (ref 70–99)
Potassium: 4.2 meq/L (ref 3.5–5.1)
Sodium: 143 meq/L (ref 135–145)

## 2023-08-06 LAB — TSH: TSH: 4.89 u[IU]/mL (ref 0.35–5.50)

## 2023-08-06 NOTE — Progress Notes (Signed)
OFFICE VISIT  08/06/2023  CC:  Chief Complaint  Patient presents with   Medication Management    Want TSH; Pt states he has been having dizzy spells, started about a week ago. Pt states he notices the dizziness more when he sits down and stands up.     Patient is a 89 y.o. male who presents for dizziness, discuss thyroid medication.  HPI: 1 wk hx lightheaded when goes from sitting to standing or from head leaning way down and they his rises. The longer he sits the more likely it will happen.  Lasts 20-30 sec.  Does not happen consistently. No vertigo, no presyncope, no falls.  No nausea, palpitations, diaphoresis, CP, arm pain, or jaw pain.  No headaches, no hearing or vision changes. No changes in diet, meds, or fluid intake prior to onset. Drinks about 65 oz clear fluids daily.  Energy level has been good. Home blood pressures 120s to 150s systolic over 70s to 80s diastolic.    3 months ago his TSH was a little high so I increased his levothyroxine to 137 mcg tab daily.  ROS as above, plus--> no fevers, he has a chronic slightly productive cough, unchanged lately.  no rashes, no melena/hematochezia.  No polyuria or polydipsia.  No myalgias or arthralgias.  No focal weakness, paresthesias, or tremors. No dysuria or unusual/new urinary urgency or frequency.  No recent changes in lower legs. No n/v/d or abd pain.      Past Medical History:  Diagnosis Date   Adenomatous colon polyp 2012; 01/2016; 03/2019   Bx 2012 showed tubular adenoma but no HG dysplasia.  2017 : adenomatous.  03/2019: adenomatous.  04/2022 adenoma x 3   Asymptomatic cholelithiasis 2017   Carpal tunnel syndrome, bilateral    Diverticulosis 03/2019   Sigmoid (on colonoscopy)   Erectile dysfunction    Flexion contracture of joint of hand, right    ?dupuytrens? Asymptomatic   Hearing loss of both ears    +hearing aids   HTN (hypertension)    Hypothyroidism    Increased prostate specific antigen (PSA) velocity  11/11/2011   Biopsy negative 03/11/12.  Followed by Dr. Laverle Patter (mild rise in PSA 2015, but Dr. Laverle Patter and pt agreed to continue with annual PSA/DRE, no invasive procedure).  PSA 4.19 04/2017.  Dr. Laverle Patter recommended no further PSA testing, just annual DRE.   Paresthesias 2024   All fingers-->gabapentin helpful   Pleural effusion associated with pulmonary infection 04/2014   PULM: benign parapneumonic effusion; resolved 05/2014   Pulmonary nodule 2015   with subcarinal adenopathy; stable on serial CT scans in 2015 and 09/2015, and 03/2016.  F/u CT 03/2017 stable---MAY BE CONSIDERED BENIGN.  NO FURTHER CT's needed.   Stress fracture of foot 2017   right    Past Surgical History:  Procedure Laterality Date   CARDIOVASCULAR STRESS TEST  07/06/2014   NORMAL Myocardial perfusion imaging, normal EF, normal LV function, no wall motion abnormality   COLONOSCOPY W/ POLYPECTOMY  2012; 01/2016; 03/2019   2017 adenomatous polyp.  Rpt 03/2019->adenomatous polyp.  04/2022 adenoma x 3   KNEE SURGERY     2009, laperoscopic   TONSILLECTOMY  1970   TRIGGER FINGER INJECTION Left 03/29/2020   UMBILICAL HERNIA REPAIR  1980    Outpatient Medications Prior to Visit  Medication Sig Dispense Refill   atorvastatin (LIPITOR) 10 MG tablet Take 1 tablet (10 mg total) by mouth daily. 90 tablet 0   benzonatate (TESSALON) 100 MG capsule Take 1  capsule (100 mg total) by mouth 3 (three) times daily as needed for cough. 30 capsule 0   calcium carbonate (OS-CAL) 600 MG TABS tablet Take 600 mg by mouth daily. Reported on 02/07/2016     fluticasone (FLONASE) 50 MCG/ACT nasal spray SHAKE LIQUID AND USE 2 SPRAYS IN EACH NOSTRIL DAILY 48 g 1   gabapentin (NEURONTIN) 300 MG capsule Take 1 capsule (300 mg total) by mouth at bedtime. 90 capsule 1   ibuprofen (ADVIL,MOTRIN) 800 MG tablet Take 800 mg by mouth every 8 (eight) hours as needed for mild pain. Reported on 02/07/2016     Levothyroxine Sodium 137 MCG CAPS 1 tab po qd 30 capsule 1    loratadine (CLARITIN) 10 MG tablet Take 10 mg by mouth daily. Reported on 02/07/2016     meloxicam (MOBIC) 15 MG tablet Take 1 tablet (15 mg total) by mouth daily. 10 tablet 0   Multiple Vitamins-Minerals (CENTRUM SILVER PO) Take 1 tablet by mouth daily. Reported on 02/07/2016     niacin 500 MG tablet Take 500 mg by mouth daily with breakfast. Reported on 02/07/2016     Omega-3 Fatty Acids (FISH OIL) 1000 MG CAPS Take 1 capsule by mouth daily. Reported on 02/07/2016     pantoprazole (PROTONIX) 40 MG tablet Take 1 tablet (40 mg total) by mouth daily. 30 tablet 3   Psyllium (METAMUCIL PO) Take 1 scoop by mouth daily. Reported on 02/07/2016     No facility-administered medications prior to visit.    No Known Allergies  Review of Systems  As per HPI  PE:    08/06/2023    8:07 AM 04/17/2023    8:46 AM 03/20/2023    9:58 AM  Vitals with BMI  Weight 172 lbs 3 oz 172 lbs 3 oz   Systolic 157 128 604  Diastolic 84 73 70  Pulse 64 71   Orthostatics: Supine blood pressure 142/82, heart rate 61.  Sitting upright blood pressure 148/84, heart rate 64.  Standing blood pressure 144/80, heart rate 67.  Physical Exam  Gen: Alert, well appearing.  Patient is oriented to person, place, time, and situation. AFFECT: pleasant, lucid thought and speech. VWU:JWJX: no injection, icteris, swelling, or exudate.  EOMI, PERRLA. Mouth: lips without lesion/swelling.  Oral mucosa pink and moist. Oropharynx without erythema, exudate, or swelling.  CV: RRR, no m/r/g.   LUNGS: CTA bilat, nonlabored resps, good aeration in all lung fields. EXT: no clubbing or cyanosis.  no edema.   LABS:  Last CBC Lab Results  Component Value Date   WBC 5.1 09/19/2022   HGB 15.3 09/19/2022   HCT 46.3 09/19/2022   MCV 98.1 09/19/2022   MCH 32.6 03/23/2014   RDW 14.3 09/19/2022   PLT 201.0 09/19/2022   Last metabolic panel Lab Results  Component Value Date   GLUCOSE 98 03/20/2023   NA 143 03/20/2023   K 4.4 03/20/2023   CL 106  03/20/2023   CO2 29 03/20/2023   BUN 15 03/20/2023   CREATININE 0.99 03/20/2023   GFR 68.84 03/20/2023   CALCIUM 8.9 03/20/2023   PROT 6.4 03/20/2023   ALBUMIN 3.9 03/20/2023   BILITOT 0.9 03/20/2023   ALKPHOS 69 03/20/2023   AST 16 03/20/2023   ALT 10 03/20/2023   Last thyroid functions Lab Results  Component Value Date   TSH 8.57 (H) 04/17/2023   IMPRESSION AND PLAN  #1 orthostatic dizziness. Mild blood pressure elevations at home. Is not clear why he started recently getting  this but there are no red flags today for neurologic, cardiovascular, infectious, or other serious etiology. Discussed good hydration, possibly use of compression hose in the future if worsening. Behavioral/activity adjustment to avoid symptoms and to prevent falls. Will hold off on treating his mild hypertension at this time. Will check CBC and be met.  2.  Hypothyroidism, most recent TSH elevated about 4 months ago.  He has been on levothyroxine 137 micrograms per day since that time. I do not think his symptoms have to do with his thyroid. Recheck TSH today.  An After Visit Summary was printed and given to the patient.  FOLLOW UP: No follow-ups on file.  Signed:  Santiago Bumpers, MD           08/06/2023

## 2023-08-11 ENCOUNTER — Other Ambulatory Visit: Payer: Self-pay

## 2023-08-11 MED ORDER — LEVOTHYROXINE SODIUM 137 MCG PO CAPS: ORAL_CAPSULE | ORAL | 2 refills | Status: DC

## 2023-08-13 ENCOUNTER — Telehealth: Payer: Self-pay

## 2023-08-13 MED ORDER — LEVOTHYROXINE SODIUM 137 MCG PO TABS: 137.0000 ug | ORAL_TABLET | Freq: Every day | ORAL | 1 refills | Status: DC

## 2023-08-13 NOTE — Telephone Encounter (Signed)
Okay levothyroxine 137 mcg tablets sent

## 2023-08-13 NOTE — Telephone Encounter (Signed)
Pt advised refill sent. °

## 2023-08-13 NOTE — Telephone Encounter (Signed)
Please confirm if pt should be taking tablet or capsule of Levothyroxine

## 2023-08-13 NOTE — Telephone Encounter (Signed)
Patient was told by pharmacy they faxed and said, prescription  BUT they did have tablet in stock.  Nothing was approved or sent back to pharmacy, from what patient is being told.  Please send new prescription for   Levothyroxine Sodium   tablets  [facility] - [place]

## 2023-08-15 ENCOUNTER — Other Ambulatory Visit (HOSPITAL_BASED_OUTPATIENT_CLINIC_OR_DEPARTMENT_OTHER): Payer: Self-pay

## 2023-08-15 MED ORDER — COVID-19 MRNA VAC-TRIS(PFIZER) 30 MCG/0.3ML IM SUSY
0.3000 mL | PREFILLED_SYRINGE | Freq: Once | INTRAMUSCULAR | 0 refills | Status: AC
  Filled 2023-08-15: qty 0.3, 1d supply, fill #0

## 2023-09-29 ENCOUNTER — Telehealth: Payer: Self-pay | Admitting: Urgent Care

## 2023-09-29 ENCOUNTER — Ambulatory Visit: Payer: Federal, State, Local not specified - PPO | Admitting: Urgent Care

## 2023-09-29 ENCOUNTER — Ambulatory Visit (HOSPITAL_BASED_OUTPATIENT_CLINIC_OR_DEPARTMENT_OTHER)
Admission: RE | Admit: 2023-09-29 | Discharge: 2023-09-29 | Disposition: A | Discharge status: Home / Self Care | Payer: Federal, State, Local not specified - PPO | Source: Ambulatory Visit | Attending: Urgent Care | Admitting: Urgent Care

## 2023-09-29 VITALS — BP 149/78 | HR 82 | Temp 97.6°F | Wt 174.0 lb

## 2023-09-29 DIAGNOSIS — R051 Acute cough: Secondary | ICD-10-CM

## 2023-09-29 DIAGNOSIS — R7981 Abnormal blood-gas level: Secondary | ICD-10-CM | POA: Diagnosis not present

## 2023-09-29 DIAGNOSIS — R0689 Other abnormalities of breathing: Secondary | ICD-10-CM

## 2023-09-29 DIAGNOSIS — Z8701 Personal history of pneumonia (recurrent): Secondary | ICD-10-CM

## 2023-09-29 DIAGNOSIS — J209 Acute bronchitis, unspecified: Secondary | ICD-10-CM

## 2023-09-29 DIAGNOSIS — J40 Bronchitis, not specified as acute or chronic: Secondary | ICD-10-CM | POA: Diagnosis not present

## 2023-09-29 MED ORDER — PREDNISONE 20 MG PO TABS: 20.0000 mg | ORAL_TABLET | Freq: Every day | ORAL | 0 refills | Status: AC

## 2023-09-29 MED ORDER — BENZONATATE 100 MG PO CAPS: 100.0000 mg | ORAL_CAPSULE | Freq: Two times a day (BID) | ORAL | 0 refills | Status: DC | PRN

## 2023-09-29 NOTE — Telephone Encounter (Signed)
Patient returning [facility]. No one was available to take call.  Please call patient back when available.

## 2023-09-29 NOTE — Telephone Encounter (Signed)
Results and recommendations given to pt.

## 2023-09-29 NOTE — Progress Notes (Signed)
Established Patient Office Visit  Subjective:  Patient ID: Joel Kelley, male    DOB: April 11, 1934  Age: 89 y.o. MRN: [id number]  Chief Complaint  Patient presents with   Cough    Cough that started 2 days ago and is worse at night when he lays down. He has been coughing up yellow mucous.   Discussed the use of AI software for clinical note transcription with the patient who gave verbal consent to proceed.    89yo male with a history of pneumonia, pulmonary nodules and pleural effusion, presented with a two-day history of productive cough with yellow sputum. Hx of 40+ years smoking, none recently. The cough was associated with chest discomfort, localized to the substernum. The patient reported shortness of breath when lying flat but denied experiencing it at rest. There was no associated fever, body aches, or symptoms of upper respiratory tract infection such as nasal congestion, postnasal drainage, sore throat, or ear pressure. The patient denied recent travel or exposure to sick contacts.  The patient had been taking over-the-counter cough syrup and cough drops, which did not alleviate the symptoms. He also reported taking Mucinex. There is no history of smoking or congestive heart failure. The patient had a stable weight with no reported swelling in the feet or ankles.  The patient had a history of a benign lung nodule, which had been monitored with repeat CT scans. He had received a COVID-19 vaccination and had a history of COVID-19 infection approximately three years ago. There was no recent exposure to small children.  Cough    Patient Active Problem List   Diagnosis Date Noted   History of colonic polyps 01/24/2016   Cough 09/09/2014   DOE (dyspnea on exertion) 07/07/2014   Other malaise and fatigue 07/07/2014   Pleural effusion on right 04/19/2014   Hyperlipidemia 12/13/2011   Health maintenance examination 11/11/2011   Increased prostate specific antigen (PSA) velocity  11/11/2011   Hypothyroidism 11/07/2010   HYPERTENSION, BENIGN 11/07/2010   Past Medical History:  Diagnosis Date   Adenomatous colon polyp 2012; 01/2016; 03/2019   Bx 2012 showed tubular adenoma but no HG dysplasia.  2017 : adenomatous.  03/2019: adenomatous.  04/2022 adenoma x 3   Asymptomatic cholelithiasis 2017   Carpal tunnel syndrome, bilateral    Diverticulosis 03/2019   Sigmoid (on colonoscopy)   Erectile dysfunction    Flexion contracture of joint of hand, right    ?dupuytrens? Asymptomatic   Hearing loss of both ears    +hearing aids   HTN (hypertension)    Hypothyroidism    Increased prostate specific antigen (PSA) velocity 11/11/2011   Biopsy negative 03/11/12.  Followed by Dr. Laverle Patter (mild rise in PSA 2015, but Dr. Laverle Patter and pt agreed to continue with annual PSA/DRE, no invasive procedure).  PSA 4.19 04/2017.  Dr. Laverle Patter recommended no further PSA testing, just annual DRE.   Paresthesias 2024   All fingers-->gabapentin helpful   Pleural effusion associated with pulmonary infection 04/2014   PULM: benign parapneumonic effusion; resolved 05/2014   Pulmonary nodule 2015   with subcarinal adenopathy; stable on serial CT scans in 2015 and 09/2015, and 03/2016.  F/u CT 03/2017 stable---MAY BE CONSIDERED BENIGN.  NO FURTHER CT's needed.   Stress fracture of foot 2017   right   Past Surgical History:  Procedure Laterality Date   CARDIOVASCULAR STRESS TEST  07/06/2014   NORMAL Myocardial perfusion imaging, normal EF, normal LV function, no wall motion abnormality   COLONOSCOPY W/ POLYPECTOMY  2012; 01/2016; 03/2019   2017 adenomatous polyp.  Rpt 03/2019->adenomatous polyp.  04/2022 adenoma x 3   KNEE SURGERY     2009, laperoscopic   TONSILLECTOMY  1970   TRIGGER FINGER INJECTION Left 03/29/2020   UMBILICAL HERNIA REPAIR  1980   Social History   Tobacco Use   Smoking status: Former    Current packs/day: 0.00    Average packs/day: 0.3 packs/day for 40.0 years (10.0 ttl pk-yrs)     Types: Cigarettes    Start date: 11/04/1948    Quit date: 11/04/1988    Years since quitting: 34.9   Smokeless tobacco: Never  Substance Use Topics   Alcohol use: No    Comment: quit in 1990    Drug use: No      ROS: as noted in HPI  Objective:     BP (!) 149/78   Pulse 82   Temp 97.6 F (36.4 C) (Oral)   Wt 174 lb (78.9 kg)   SpO2 94%   BMI 28.51 kg/m  BP Readings from Last 3 Encounters:  09/29/23 (!) 149/78  08/06/23 (!) 157/84  04/17/23 128/73   Wt Readings from Last 3 Encounters:  09/29/23 174 lb (78.9 kg)  08/06/23 172 lb 3.2 oz (78.1 kg)  04/17/23 172 lb 3.2 oz (78.1 kg)      Physical Exam Vitals and nursing note reviewed.  Constitutional:      General: He is not in acute distress.    Appearance: Normal appearance. He is not ill-appearing, toxic-appearing or diaphoretic.  HENT:     Head: Normocephalic and atraumatic.     Jaw: There is normal jaw occlusion.     Salivary Glands: Right salivary gland is not diffusely enlarged or tender. Left salivary gland is not diffusely enlarged or tender.     Right Ear: Ear canal normal. No drainage, swelling or tenderness. A middle ear effusion is present. No hemotympanum. Tympanic membrane is not injected, scarred, perforated, erythematous or bulging.     Left Ear: No drainage, swelling or tenderness. A middle ear effusion is present. No hemotympanum. Tympanic membrane is not injected, scarred, perforated, erythematous or bulging.     Nose: Nose normal. No congestion or rhinorrhea.     Right Turbinates: Not enlarged or swollen.     Left Turbinates: Not enlarged or swollen.     Right Sinus: No maxillary sinus tenderness or frontal sinus tenderness.     Left Sinus: No maxillary sinus tenderness or frontal sinus tenderness.     Mouth/Throat:     Lips: Pink.     Mouth: Mucous membranes are moist.     Pharynx: Oropharynx is clear. Uvula midline. No pharyngeal swelling, oropharyngeal exudate, posterior oropharyngeal erythema,  uvula swelling or postnasal drip.     Comments: Prior tonsillectomy Cardiovascular:     Rate and Rhythm: Normal rate and regular rhythm.  Pulmonary:     Effort: Pulmonary effort is normal.     Breath sounds: No stridor. Rhonchi (LUL with increased tactile fremitus, and positive whispered pectoriloquy and egophony) present. No wheezing or rales.  Chest:     Chest wall: No tenderness.  Musculoskeletal:     Cervical back: Normal range of motion and neck supple. No rigidity or tenderness.  Lymphadenopathy:     Cervical: No cervical adenopathy.  Neurological:     Mental Status: He is alert.      No results found for any visits on 09/29/23.    The ASCVD Risk score (Arnett DK,  et al., 2019) failed to calculate for the following reasons:   The 2019 ASCVD risk score is only valid for ages 81 to 36  Assessment & Plan:  Acute cough -     DG Chest 2 View; Future  Adventitious breath sounds -     DG Chest 2 View; Future  Low oxygen saturation -     DG Chest 2 View; Future  History of pneumonia -     DG Chest 2 View; Future  Acute Cough with Sputum Production New onset of cough with yellow sputum production for 2 days. No fever, body aches, or recent sick contacts. History of pneumonia and pleural effusion. No current wheezing, sinus pain, nasal congestion, postnasal drainage, sore throat, or ear pressure. Chest discomfort on the right side. Decreased oxygen saturation compared to previous visit. Positive for two out of three physical exam tests suggestive of potential infection. -Order stat chest x-ray at [facility] location to rule out pneumonia. -Continue Mucinex (guaifenesin) as currently taking. -If chest x-ray is negative, consider other non-antibiotic treatments.   No follow-ups on file.   Maretta Bees, PA

## 2023-09-29 NOTE — Patient Instructions (Addendum)
Please go to [facility] off [address] to obtain a STAT chest xray. For now, please continue mucinex 600mg  twice daily. Drink plenty of water.  We will call with the results of the xray once received to discuss further treatment recommendations.

## 2023-09-29 NOTE — Telephone Encounter (Signed)
Called pt at 11:54am regarding CXR results.  Pt did not answer, VM was left for him to call back.  If he calls back, he should be notified that his chest xray is NEGATIVE for pneumonia but does indicate peribronchial thickening which is consistent with bronchitis, most likely viral.  I would recommend he continue his mucinex 600mg  twice daily with plenty of water.  I have called in prednisone for him to take, once daily with breakfast for the next 5 days. I have also called in a cough medication he can take before bed.  If he develops a fever or shortness of breath, he should contact our office.

## 2023-10-03 ENCOUNTER — Other Ambulatory Visit: Payer: Self-pay | Admitting: Family Medicine

## 2023-10-21 NOTE — Progress Notes (Deleted)
Office Note 10/21/2023  CC: No chief complaint on file.   HPI:  Patient is a 89 y.o. male who is here for annual health maintenance exam and f/u hypothyroidism and HLD.  O/V 11/25 for bronchitis, prednisone and tessalon rx'd. ***   Past Medical History:  Diagnosis Date   Adenomatous colon polyp 2012; 01/2016; 03/2019   Bx 2012 showed tubular adenoma but no HG dysplasia.  2017 : adenomatous.  03/2019: adenomatous.  04/2022 adenoma x 3   Asymptomatic cholelithiasis 2017   Carpal tunnel syndrome, bilateral    Diverticulosis 03/2019   Sigmoid (on colonoscopy)   Erectile dysfunction    Flexion contracture of joint of hand, right    ?dupuytrens? Asymptomatic   Hearing loss of both ears    +hearing aids   HTN (hypertension)    Hypothyroidism    Increased prostate specific antigen (PSA) velocity 11/11/2011   Biopsy negative 03/11/12.  Followed by Dr. Laverle Patter (mild rise in PSA 2015, but Dr. Laverle Patter and pt agreed to continue with annual PSA/DRE, no invasive procedure).  PSA 4.19 04/2017.  Dr. Laverle Patter recommended no further PSA testing, just annual DRE.   Paresthesias 2024   All fingers-->gabapentin helpful   Pleural effusion associated with pulmonary infection 04/2014   PULM: benign parapneumonic effusion; resolved 05/2014   Pulmonary nodule 2015   with subcarinal adenopathy; stable on serial CT scans in 2015 and 09/2015, and 03/2016.  F/u CT 03/2017 stable---MAY BE CONSIDERED BENIGN.  NO FURTHER CT's needed.   Stress fracture of foot 2017   right    Past Surgical History:  Procedure Laterality Date   CARDIOVASCULAR STRESS TEST  07/06/2014   NORMAL Myocardial perfusion imaging, normal EF, normal LV function, no wall motion abnormality   COLONOSCOPY W/ POLYPECTOMY  2012; 01/2016; 03/2019   2017 adenomatous polyp.  Rpt 03/2019->adenomatous polyp.  04/2022 adenoma x 3   KNEE SURGERY     2009, laperoscopic   TONSILLECTOMY  1970   TRIGGER FINGER INJECTION Left 03/29/2020   UMBILICAL HERNIA  REPAIR  1980    Family History  Problem Relation Age of Onset   Hypertension Mother    Hypertension Father     Social History   Socioeconomic History   Marital status: Married    Spouse name: Stanton Kidney   Number of children: Not on file   Years of education: Not on file   Highest education level: 12th grade  Occupational History   Occupation: Retired Research officer, political party   Tobacco Use   Smoking status: Former    Current packs/day: 0.00    Average packs/day: 0.3 packs/day for 40.0 years (10.0 ttl pk-yrs)    Types: Cigarettes    Start date: 11/04/1948    Quit date: 11/04/1988    Years since quitting: 34.9   Smokeless tobacco: Never  Substance and Sexual Activity   Alcohol use: No    Comment: quit in 1990    Drug use: No   Sexual activity: Not on file  Other Topics Concern   Not on file  Social History Narrative   Divorced, remarried, one son and one step daughter (step-daughter is local), 2 grandchildren.   Relocated from [place] about 2010.   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   Distant tobacco abuse: avg 5 cigs day for 20 yrs on and off, quit for good around 1990.   No alcohol.   Daily walking at the park.   Social Drivers of Health   Financial Resource Strain: Low Risk  (09/29/2023)  Overall Financial Resource Strain (CARDIA)    Difficulty of Paying Living Expenses: Not hard at all  Food Insecurity: No Food Insecurity (09/29/2023)   Hunger Vital Sign    Worried About Running Out of Food in the Last Year: Never true    Ran Out of Food in the Last Year: Never true  Transportation Needs: No Transportation Needs (09/29/2023)   PRAPARE - Administrator, Civil Service (Medical): No    Lack of Transportation (Non-Medical): No  Physical Activity: Sufficiently Active (09/29/2023)   Exercise Vital Sign    Days of Exercise per Week: 6 days    Minutes of Exercise per Session: 90 min  Stress: No Stress Concern Present (09/29/2023)   [employer] of Occupational Health -  Occupational Stress Questionnaire    Feeling of Stress : Not at all  Social Connections: Socially Integrated (09/29/2023)   Social Connection and Isolation Panel [NHANES]    Frequency of Communication with Friends and Family: More than three times a week    Frequency of Social Gatherings with Friends and Family: More than three times a week    Attends Religious Services: More than 4 times per year    Active Member of [employer] or Organizations: Yes    Attends Engineer, structural: More than 4 times per year    Marital Status: Married  Catering manager Violence: Unknown (02/07/2022)   Received from [facility], [facility]   HITS    Physically Hurt: Not on file    Insult or Talk Down To: Not on file    Threaten Physical Harm: Not on file    Scream or Curse: Not on file    Outpatient Medications Prior to Visit  Medication Sig Dispense Refill   atorvastatin (LIPITOR) 10 MG tablet TAKE 1 TABLET(10 MG) BY MOUTH DAILY 30 tablet 0   benzonatate (TESSALON) 100 MG capsule Take 1 capsule (100 mg total) by mouth 2 (two) times daily as needed for cough. 30 capsule 0   calcium carbonate (OS-CAL) 600 MG TABS tablet Take 600 mg by mouth daily. Reported on 02/07/2016     fluticasone (FLONASE) 50 MCG/ACT nasal spray SHAKE LIQUID AND USE 2 SPRAYS IN EACH NOSTRIL DAILY 48 g 1   gabapentin (NEURONTIN) 300 MG capsule Take 1 capsule (300 mg total) by mouth at bedtime. 90 capsule 1   ibuprofen (ADVIL,MOTRIN) 800 MG tablet Take 800 mg by mouth every 8 (eight) hours as needed for mild pain. Reported on 02/07/2016     levothyroxine (SYNTHROID) 137 MCG tablet Take 1 tablet (137 mcg total) by mouth daily before breakfast. 90 tablet 1   loratadine (CLARITIN) 10 MG tablet Take 10 mg by mouth daily. Reported on 02/07/2016     meloxicam (MOBIC) 15 MG tablet Take 1 tablet (15 mg total) by mouth daily. 10 tablet 0   Multiple Vitamins-Minerals (CENTRUM SILVER PO) Take 1 tablet by mouth daily. Reported on 02/07/2016      niacin 500 MG tablet Take 500 mg by mouth daily with breakfast. Reported on 02/07/2016     Omega-3 Fatty Acids (FISH OIL) 1000 MG CAPS Take 1 capsule by mouth daily. Reported on 02/07/2016     pantoprazole (PROTONIX) 40 MG tablet Take 1 tablet (40 mg total) by mouth daily. 30 tablet 3   Psyllium (METAMUCIL PO) Take 1 scoop by mouth daily. Reported on 02/07/2016     No facility-administered medications prior to visit.    No Known Allergies  Review of  Systems *** PE;    09/29/2023   10:40 AM 08/06/2023    8:07 AM 04/17/2023    8:46 AM  Vitals with BMI  Weight 174 lbs 172 lbs 3 oz 172 lbs 3 oz  Systolic 149 157 161  Diastolic 78 84 73  Pulse 82 64 71     *** Pertinent labs:  Lab Results  Component Value Date   TSH 4.89 08/06/2023   Lab Results  Component Value Date   WBC 5.7 08/06/2023   HGB 14.9 08/06/2023   HCT 46.1 08/06/2023   MCV 96.4 08/06/2023   PLT 199.0 08/06/2023   Lab Results  Component Value Date   CREATININE 0.87 08/06/2023   BUN 16 08/06/2023   NA 143 08/06/2023   K 4.2 08/06/2023   CL 107 08/06/2023   CO2 29 08/06/2023   Lab Results  Component Value Date   ALT 10 03/20/2023   AST 16 03/20/2023   ALKPHOS 69 03/20/2023   BILITOT 0.9 03/20/2023   Lab Results  Component Value Date   CHOL 132 03/20/2023   Lab Results  Component Value Date   HDL 42.30 03/20/2023   Lab Results  Component Value Date   LDLCALC 72 03/20/2023   Lab Results  Component Value Date   TRIG 93.0 03/20/2023   Lab Results  Component Value Date   CHOLHDL 3 03/20/2023   Lab Results  Component Value Date   PSA 3.94 11/11/2011   PSA 2.61 12/17/2010   Lab Results  Component Value Date   HGBA1C 5.4 03/25/2022   ASSESSMENT AND PLAN:   No problem-specific Assessment & Plan notes found for this encounter.  Health maintenance exam: Reviewed age and gender appropriate health maintenance issues (prudent diet, regular exercise, health risks of tobacco and excessive  alcohol, use of seatbelts, fire alarms in home, use of sunscreen).  Also reviewed age and gender appropriate health screening as well as vaccine recommendations. Vaccines: ALL UTD. Labs: fasting HP labs ordered. Prostate ca screening: no further prostate ca screening indicated due to age. Colon ca screening: Per patient report a repeat colonoscopy this year with digestive health revealed some polyps, benign.  Will get records.  An After Visit Summary was printed and given to the patient.  FOLLOW UP:  No follow-ups on file.  Signed:  Santiago Bumpers, MD           10/21/2023

## 2023-10-22 ENCOUNTER — Encounter: Payer: Federal, State, Local not specified - PPO | Admitting: Family Medicine

## 2023-10-22 DIAGNOSIS — E7800 Pure hypercholesterolemia, unspecified: Secondary | ICD-10-CM

## 2023-10-22 DIAGNOSIS — Z8679 Personal history of other diseases of the circulatory system: Secondary | ICD-10-CM

## 2023-10-22 DIAGNOSIS — Z0000 Encounter for general adult medical examination without abnormal findings: Secondary | ICD-10-CM

## 2023-10-22 DIAGNOSIS — E039 Hypothyroidism, unspecified: Secondary | ICD-10-CM

## 2023-10-28 ENCOUNTER — Encounter: Payer: Federal, State, Local not specified - PPO | Admitting: Family Medicine

## 2023-11-03 NOTE — Progress Notes (Signed)
 Office Note 11/04/2023  CC:  Chief Complaint  Patient presents with   Annual Exam    Pt is fasting.     HPI:  Patient is a 89 y.o. male who is here for annual health maintenance exam and follow-up hypertension, hypothyroidism, and hyperlipidemia.  Home blood pressures around 130/80 average. Compliant with chronic medications.  He had bronchitis 09/29/2023, chest x-ray showed bronchitic changes otherwise normal. He has no residual symptoms at this time.  Past Medical History:  Diagnosis Date   Adenomatous colon polyp 2012; 01/2016; 03/2019   Bx 2012 showed tubular adenoma but no HG dysplasia.  2017 : adenomatous.  03/2019: adenomatous.  04/2022 adenoma x 3   Asymptomatic cholelithiasis 2017   Carpal tunnel syndrome, bilateral    Diverticulosis 03/2019   Sigmoid (on colonoscopy)   Erectile dysfunction    Flexion contracture of joint of hand, right    ?dupuytrens? Asymptomatic   Hearing loss of both ears    +hearing aids   HTN (hypertension)    Hypothyroidism    Increased prostate specific antigen (PSA) velocity 11/11/2011   Biopsy negative 03/11/12.  Followed by Dr. Renda (mild rise in PSA 2015, but Dr. Renda and pt agreed to continue with annual PSA/DRE, no invasive procedure).  PSA 4.19 04/2017.  Dr. Renda recommended no further PSA testing, just annual DRE.   Paresthesias 2024   All fingers-->gabapentin  helpful   Pleural effusion associated with pulmonary infection 04/2014   PULM: benign parapneumonic effusion; resolved 05/2014   Pulmonary nodule 2015   with subcarinal adenopathy; stable on serial CT scans in 2015 and 09/2015, and 03/2016.  F/u CT 03/2017 stable---MAY BE CONSIDERED BENIGN.  NO FURTHER CT's needed.   Stress fracture of foot 2017   right    Past Surgical History:  Procedure Laterality Date   CARDIOVASCULAR STRESS TEST  07/06/2014   NORMAL Myocardial perfusion imaging, normal EF, normal LV function, no wall motion abnormality   COLONOSCOPY W/ POLYPECTOMY   2012; 01/2016; 03/2019   2017 adenomatous polyp.  Rpt 03/2019->adenomatous polyp.  04/2022 adenoma x 3   KNEE SURGERY     2009, laperoscopic   TONSILLECTOMY  1970   TRIGGER FINGER INJECTION Left 03/29/2020   UMBILICAL HERNIA REPAIR  1980    Family History  Problem Relation Age of Onset   Hypertension Mother    Hypertension Father     Social History   Socioeconomic History   Marital status: Married    Spouse name: Adrien   Number of children: Not on file   Years of education: Not on file   Highest education level: 12th grade  Occupational History   Occupation: Retired research officer, political party   Tobacco Use   Smoking status: Former    Current packs/day: 0.00    Average packs/day: 0.3 packs/day for 40.0 years (10.0 ttl pk-yrs)    Types: Cigarettes    Start date: 11/04/1948    Quit date: 11/04/1988    Years since quitting: 35.0   Smokeless tobacco: Never  Substance and Sexual Activity   Alcohol use: No    Comment: quit in 1990    Drug use: No   Sexual activity: Not on file  Other Topics Concern   Not on file  Social History Narrative   Divorced, remarried, one son and one step daughter (step-daughter is local), 2 grandchildren.   Relocated from [place]  about 2010.   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   Distant tobacco abuse: avg 5 cigs day for 20 yrs on  and off, quit for good around 1990.   No alcohol.   Daily walking at the park.   Social Drivers of Corporate Investment Banker Strain: Low Risk  (10/31/2023)   Overall Financial Resource Strain (CARDIA)    Difficulty of Paying Living Expenses: Not hard at all  Food Insecurity: No Food Insecurity (10/31/2023)   Hunger Vital Sign    Worried About Running Out of Food in the Last Year: Never true    Ran Out of Food in the Last Year: Never true  Transportation Needs: No Transportation Needs (10/31/2023)   PRAPARE - Administrator, Civil Service (Medical): No    Lack of Transportation (Non-Medical): No  Physical Activity:  Sufficiently Active (10/31/2023)   Exercise Vital Sign    Days of Exercise per Week: 5 days    Minutes of Exercise per Session: 40 min  Stress: No Stress Concern Present (10/31/2023)   [employer] of Occupational Health - Occupational Stress Questionnaire    Feeling of Stress : Not at all  Social Connections: Socially Integrated (10/31/2023)   Social Connection and Isolation Panel [NHANES]    Frequency of Communication with Friends and Family: More than three times a week    Frequency of Social Gatherings with Friends and Family: More than three times a week    Attends Religious Services: More than 4 times per year    Active Member of [employer] or Organizations: No    Attends Engineer, Structural: More than 4 times per year    Marital Status: Married  Catering Manager Violence: Unknown (02/07/2022)   Received from [facility], [facility]   HITS    Physically Hurt: Not on file    Insult or Talk Down To: Not on file    Threaten Physical Harm: Not on file    Scream or Curse: Not on file    Outpatient Medications Prior to Visit  Medication Sig Dispense Refill   atorvastatin  (LIPITOR) 10 MG tablet TAKE 1 TABLET(10 MG) BY MOUTH DAILY 30 tablet 0   benzonatate  (TESSALON ) 100 MG capsule Take 1 capsule (100 mg total) by mouth 2 (two) times daily as needed for cough. 30 capsule 0   calcium  carbonate (OS-CAL) 600 MG TABS tablet Take 600 mg by mouth daily. Reported on 02/07/2016     fluticasone  (FLONASE ) 50 MCG/ACT nasal spray SHAKE LIQUID AND USE 2 SPRAYS IN EACH NOSTRIL DAILY 48 g 1   gabapentin  (NEURONTIN ) 300 MG capsule Take 1 capsule (300 mg total) by mouth at bedtime. 90 capsule 1   ibuprofen (ADVIL,MOTRIN) 800 MG tablet Take 800 mg by mouth every 8 (eight) hours as needed for mild pain. Reported on 02/07/2016     levothyroxine  (SYNTHROID ) 137 MCG tablet Take 1 tablet (137 mcg total) by mouth daily before breakfast. 90 tablet 1   loratadine  (CLARITIN ) 10 MG tablet Take 10 mg by  mouth daily. Reported on 02/07/2016     meloxicam  (MOBIC ) 15 MG tablet Take 1 tablet (15 mg total) by mouth daily. 10 tablet 0   Multiple Vitamins-Minerals (CENTRUM SILVER PO) Take 1 tablet by mouth daily. Reported on 02/07/2016     niacin  500 MG tablet Take 500 mg by mouth daily with breakfast. Reported on 02/07/2016     Omega-3 Fatty Acids (FISH OIL) 1000 MG CAPS Take 1 capsule by mouth daily. Reported on 02/07/2016     pantoprazole  (PROTONIX ) 40 MG tablet Take 1 tablet (40 mg total) by mouth daily. 30  tablet 3   Psyllium (METAMUCIL PO) Take 1 scoop by mouth daily. Reported on 02/07/2016     No facility-administered medications prior to visit.    No Known Allergies  Review of Systems  Constitutional:  Negative for appetite change, chills, fatigue and fever.  HENT:  Negative for congestion, dental problem, ear pain and sore throat.   Eyes:  Negative for discharge, redness and visual disturbance.  Respiratory:  Negative for cough, chest tightness, shortness of breath and wheezing.   Cardiovascular:  Negative for chest pain, palpitations and leg swelling.  Gastrointestinal:  Negative for abdominal pain, blood in stool, diarrhea, nausea and vomiting.  Genitourinary:  Negative for difficulty urinating, dysuria, flank pain, frequency, hematuria and urgency.  Musculoskeletal:  Negative for arthralgias, back pain, joint swelling, myalgias and neck stiffness.  Skin:  Negative for pallor and rash.  Neurological:  Negative for dizziness, speech difficulty, weakness and headaches.  Hematological:  Negative for adenopathy. Does not bruise/bleed easily.  Psychiatric/Behavioral:  Negative for confusion and sleep disturbance. The patient is not nervous/anxious.     PE;    11/04/2023    9:17 AM 11/04/2023    9:03 AM 09/29/2023   10:40 AM  Vitals with BMI  Height  5' 6   Weight  175 lbs 3 oz 174 lbs  BMI  28.29   Systolic 142 159 850  Diastolic 84 80 78  Pulse  62 82    Gen: Alert, well appearing.   Patient is oriented to person, place, time, and situation. AFFECT: pleasant, lucid thought and speech. ENT: Ears: EACs clear, normal epithelium.  TMs with good light reflex and landmarks bilaterally.  Eyes: no injection, icteris, swelling, or exudate.  EOMI, PERRLA. Nose: no drainage or turbinate edema/swelling.  No injection or focal lesion.  Mouth: lips without lesion/swelling.  Oral mucosa pink and moist.  Dentition intact and without obvious caries or gingival swelling.  Oropharynx without erythema, exudate, or swelling.  Neck: supple/nontender.  No LAD, mass, or TM.  Carotid pulses 2+ bilaterally, without bruits. CV: RRR, no m/r/g.   LUNGS: R base soft insp dry crackles, otherwise lungs CTA bilat, nonlabored resps, good aeration in all lung fields. ABD: soft, NT, ND, BS normal.  No hepatospenomegaly or mass.  No bruits. EXT: no clubbing, cyanosis, or edema.  Musculoskeletal: no joint swelling, erythema, warmth, or tenderness.  ROM of all joints intact. Skin - no sores or suspicious lesions or rashes or color changes   Pertinent labs:  Lab Results  Component Value Date   TSH 4.89 08/06/2023   Lab Results  Component Value Date   WBC 5.7 08/06/2023   HGB 14.9 08/06/2023   HCT 46.1 08/06/2023   MCV 96.4 08/06/2023   PLT 199.0 08/06/2023   Lab Results  Component Value Date   CREATININE 0.87 08/06/2023   BUN 16 08/06/2023   NA 143 08/06/2023   K 4.2 08/06/2023   CL 107 08/06/2023   CO2 29 08/06/2023   Lab Results  Component Value Date   ALT 10 03/20/2023   AST 16 03/20/2023   ALKPHOS 69 03/20/2023   BILITOT 0.9 03/20/2023   Lab Results  Component Value Date   CHOL 132 03/20/2023   Lab Results  Component Value Date   HDL 42.30 03/20/2023   Lab Results  Component Value Date   LDLCALC 72 03/20/2023   Lab Results  Component Value Date   TRIG 93.0 03/20/2023   Lab Results  Component Value Date  CHOLHDL 3 03/20/2023   Lab Results  Component Value Date   PSA  3.94 11/11/2011   PSA 2.61 12/17/2010   Lab Results  Component Value Date   HGBA1C 5.4 03/25/2022   ASSESSMENT AND PLAN:   #1 health maintenance exam: Reviewed age and gender appropriate health maintenance issues (prudent diet, regular exercise, health risks of tobacco and excessive alcohol, use of seatbelts, fire alarms in home, use of sunscreen).  Also reviewed age and gender appropriate health screening as well as vaccine recommendations. Vaccines: ALL UTD. Labs: lipid panel, hepatic panel (BMET, TSH, and CBC normal 08/06/23). Prostate ca screening: no further prostate ca screening indicated due to age. Colon ca screening: most recent colonoscopy 04/2022 showed 3 adenomas.  #2 acute bronchitis, history of significant past pulmonary infections--including an effusion. Lung exam today is indicative of some scarring. He is well. No new evaluation at this time.  3.  Hypertension, currently on no medications.  He tends to have some orthostatic symptoms at times.  He monitors blood pressure regularly at home and is doing fine.  4.  Hypercholesterolemia, doing well long-term on atorvastatin  10 mg a day. Lipid panel and hepatic panel today.  5.  Acquired hypothyroidism.  137 mcg levothyroxine  daily. TSH was 4.89 on 08/06/2023. Plan repeat TSH in 6 months.  An After Visit Summary was printed and given to the patient.  FOLLOW UP:  No follow-ups on file.  Signed:  Gerlene Hockey, MD           11/04/2023

## 2023-11-04 ENCOUNTER — Ambulatory Visit (INDEPENDENT_AMBULATORY_CARE_PROVIDER_SITE_OTHER): Payer: Federal, State, Local not specified - PPO | Admitting: Family Medicine

## 2023-11-04 ENCOUNTER — Encounter: Payer: Self-pay | Admitting: Family Medicine

## 2023-11-04 VITALS — BP 142/84 | HR 62 | Ht 66.0 in | Wt 175.2 lb

## 2023-11-04 DIAGNOSIS — E039 Hypothyroidism, unspecified: Secondary | ICD-10-CM

## 2023-11-04 DIAGNOSIS — E7800 Pure hypercholesterolemia, unspecified: Secondary | ICD-10-CM

## 2023-11-04 DIAGNOSIS — I10 Essential (primary) hypertension: Secondary | ICD-10-CM | POA: Diagnosis not present

## 2023-11-04 DIAGNOSIS — Z0000 Encounter for general adult medical examination without abnormal findings: Secondary | ICD-10-CM | POA: Diagnosis not present

## 2023-11-04 LAB — LIPID PANEL
Cholesterol: 138 mg/dL (ref 0–200)
HDL: 46.8 mg/dL (ref >39.00)
LDL Cholesterol: 75 mg/dL (ref 0–99)
NonHDL: 90.83
Total CHOL/HDL Ratio: 3
Triglycerides: 78 mg/dL (ref 0.0–149.0)
VLDL: 15.6 mg/dL (ref 0.0–40.0)

## 2023-11-04 LAB — HEPATIC FUNCTION PANEL
ALT: 12 U/L (ref 0–53)
AST: 16 U/L (ref 0–37)
Albumin: 4.1 g/dL (ref 3.5–5.2)
Alkaline Phosphatase: 84 U/L (ref 39–117)
Bilirubin, Direct: 0.2 mg/dL (ref 0.0–0.3)
Total Bilirubin: 1 mg/dL (ref 0.2–1.2)
Total Protein: 6.5 g/dL (ref 6.0–8.3)

## 2023-11-04 MED ORDER — GABAPENTIN 300 MG PO CAPS: 300.0000 mg | ORAL_CAPSULE | Freq: Every day | ORAL | 1 refills | Status: DC

## 2023-11-04 MED ORDER — ATORVASTATIN CALCIUM 10 MG PO TABS: 10.0000 mg | ORAL_TABLET | Freq: Every day | ORAL | 1 refills | Status: DC

## 2023-11-04 NOTE — Patient Instructions (Signed)
 Health Maintenance, Male  Adopting a healthy lifestyle and getting preventive care are important in promoting health and wellness. Ask your health care provider about:  The right schedule for you to have regular tests and exams.  Things you can do on your own to prevent diseases and keep yourself healthy.  What should I know about diet, weight, and exercise?  Eat a healthy diet    Eat a diet that includes plenty of vegetables, fruits, low-fat dairy products, and lean protein.  Do not eat a lot of foods that are high in solid fats, added sugars, or sodium.  Maintain a healthy weight  Body mass index (BMI) is a measurement that can be used to identify possible weight problems. It estimates body fat based on height and weight. Your health care provider can help determine your BMI and help you achieve or maintain a healthy weight.  Get regular exercise  Get regular exercise. This is one of the most important things you can do for your health. Most adults should:  Exercise for at least 150 minutes each week. The exercise should increase your heart rate and make you sweat (moderate-intensity exercise).  Do strengthening exercises at least twice a week. This is in addition to the moderate-intensity exercise.  Spend less time sitting. Even light physical activity can be beneficial.  Watch cholesterol and blood lipids  Have your blood tested for lipids and cholesterol at 87 years of age, then have this test every 5 years.  You may need to have your cholesterol levels checked more often if:  Your lipid or cholesterol levels are high.  You are older than 87 years of age.  You are at high risk for heart disease.  What should I know about cancer screening?  Many types of cancers can be detected early and may often be prevented. Depending on your health history and family history, you may need to have cancer screening at various ages. This may include screening for:  Colorectal cancer.  Prostate cancer.  Skin cancer.  Lung  cancer.  What should I know about heart disease, diabetes, and high blood pressure?  Blood pressure and heart disease  High blood pressure causes heart disease and increases the risk of stroke. This is more likely to develop in people who have high blood pressure readings or are overweight.  Talk with your health care provider about your target blood pressure readings.  Have your blood pressure checked:  Every 3-5 years if you are 24-52 years of age.  Every year if you are 3 years old or older.  If you are between the ages of 60 and 72 and are a current or former smoker, ask your health care provider if you should have a one-time screening for abdominal aortic aneurysm (AAA).  Diabetes  Have regular diabetes screenings. This checks your fasting blood sugar level. Have the screening done:  Once every three years after age 66 if you are at a normal weight and have a low risk for diabetes.  More often and at a younger age if you are overweight or have a high risk for diabetes.  What should I know about preventing infection?  Hepatitis B  If you have a higher risk for hepatitis B, you should be screened for this virus. Talk with your health care provider to find out if you are at risk for hepatitis B infection.  Hepatitis C  Blood testing is recommended for:  Everyone born from [birth year] through 1963.  Anyone  with known risk factors for hepatitis C.  Sexually transmitted infections (STIs)  You should be screened each year for STIs, including gonorrhea and chlamydia, if:  You are sexually active and are younger than 87 years of age.  You are older than 87 years of age and your health care provider tells you that you are at risk for this type of infection.  Your sexual activity has changed since you were last screened, and you are at increased risk for chlamydia or gonorrhea. Ask your health care provider if you are at risk.  Ask your health care provider about whether you are at high risk for HIV. Your health care provider  may recommend a prescription medicine to help prevent HIV infection. If you choose to take medicine to prevent HIV, you should first get tested for HIV. You should then be tested every 3 months for as long as you are taking the medicine.  Follow these instructions at home:  Alcohol use  Do not drink alcohol if your health care provider tells you not to drink.  If you drink alcohol:  Limit how much you have to 0-2 drinks a day.  Know how much alcohol is in your drink. In the [place] one drink equals one 12 oz bottle of beer (355 mL), one 5 oz glass of wine (148 mL), or one 1 oz glass of hard liquor (44 mL).  Lifestyle  Do not use any products that contain nicotine or tobacco. These products include cigarettes, chewing tobacco, and vaping devices, such as e-cigarettes. If you need help quitting, ask your health care provider.  Do not use street drugs.  Do not share needles.  Ask your health care provider for help if you need support or information about quitting drugs.  General instructions  Schedule regular health, dental, and eye exams.  Stay current with your vaccines.  Tell your health care provider if:  You often feel depressed.  You have ever been abused or do not feel safe at home.  Summary  Adopting a healthy lifestyle and getting preventive care are important in promoting health and wellness.  Follow your health care provider's instructions about healthy diet, exercising, and getting tested or screened for diseases.  Follow your health care provider's instructions on monitoring your cholesterol and blood pressure.  This information is not intended to replace advice given to you by your health care provider. Make sure you discuss any questions you have with your health care provider.  Document Revised: 03/12/2021 Document Reviewed: 03/12/2021  Elsevier Patient Education  2024 [employer].

## 2024-01-30 ENCOUNTER — Other Ambulatory Visit: Payer: Self-pay | Admitting: Family Medicine

## 2024-05-03 ENCOUNTER — Encounter: Payer: Self-pay | Admitting: Family Medicine

## 2024-05-03 ENCOUNTER — Ambulatory Visit: Payer: Federal, State, Local not specified - PPO | Admitting: Family Medicine

## 2024-05-03 VITALS — BP 136/69 | HR 70 | Temp 97.7°F | Ht 66.0 in | Wt 168.2 lb

## 2024-05-03 DIAGNOSIS — E7800 Pure hypercholesterolemia, unspecified: Secondary | ICD-10-CM | POA: Diagnosis not present

## 2024-05-03 DIAGNOSIS — E039 Hypothyroidism, unspecified: Secondary | ICD-10-CM | POA: Diagnosis not present

## 2024-05-03 DIAGNOSIS — I10 Essential (primary) hypertension: Secondary | ICD-10-CM

## 2024-05-03 LAB — BASIC METABOLIC PANEL WITH GFR
BUN: 18 mg/dL (ref 6–23)
CO2: 27 meq/L (ref 19–32)
Calcium: 8.6 mg/dL (ref 8.4–10.5)
Chloride: 107 meq/L (ref 96–112)
Creatinine, Ser: 0.95 mg/dL (ref 0.40–1.50)
GFR: 71.76 mL/min (ref >60.00)
Glucose, Bld: 110 mg/dL — ABNORMAL HIGH (ref 70–99)
Potassium: 4.1 meq/L (ref 3.5–5.1)
Sodium: 143 meq/L (ref 135–145)

## 2024-05-03 LAB — LIPID PANEL
Cholesterol: 121 mg/dL (ref 0–200)
HDL: 41.2 mg/dL (ref >39.00)
LDL Cholesterol: 67 mg/dL (ref 0–99)
NonHDL: 79.3
Total CHOL/HDL Ratio: 3
Triglycerides: 63 mg/dL (ref 0.0–149.0)
VLDL: 12.6 mg/dL (ref 0.0–40.0)

## 2024-05-03 LAB — TSH: TSH: 0.95 u[IU]/mL (ref 0.35–5.50)

## 2024-05-03 MED ORDER — GABAPENTIN 300 MG PO CAPS: 300.0000 mg | ORAL_CAPSULE | Freq: Every day | ORAL | 1 refills | Status: DC

## 2024-05-03 MED ORDER — LEVOTHYROXINE SODIUM 137 MCG PO TABS: 137.0000 ug | ORAL_TABLET | Freq: Every day | ORAL | 3 refills | Status: AC

## 2024-05-03 NOTE — Progress Notes (Signed)
 OFFICE VISIT  05/03/2024  CC:  Chief Complaint  Patient presents with   Medical Management of Chronic Issues    Pt is fasting    Patient is a 89 y.o. male who presents for 28-month follow-up hypertension, hypothyroidism, and hyperlipidemia. A/P as of last visit: #1 acute bronchitis, history of significant past pulmonary infections--including an effusion. Lung exam today is indicative of some scarring. He is well. No new evaluation at this time.   2.  Hypertension, currently on no medications.  He tends to have some orthostatic symptoms at times.  He monitors blood pressure regularly at home and is doing fine.   3.  Hypercholesterolemia, doing well long-term on atorvastatin  10 mg a day. Lipid panel and hepatic panel today.   4.  Acquired hypothyroidism.  137 mcg levothyroxine  daily. TSH was 4.89 on 08/06/2023. Plan repeat TSH in 6 months.  INTERIM HX: He is feeling well. He walks on his treadmill daily. He spends a lot of time tending his yard and garden.  No acute concerns.  ROS--> no fevers, no CP, no SOB, no wheezing, no cough, no dizziness, no HAs, no rashes, no melena/hematochezia.  No polyuria or polydipsia.  No myalgias or arthralgias.  No focal weakness, paresthesias, or tremors.  No acute vision or hearing abnormalities.  No dysuria or unusual/new urinary urgency or frequency.  No recent changes in lower legs. No n/v/d or abd pain.  No palpitations.      Past Medical History:  Diagnosis Date   Adenomatous colon polyp 2012; 01/2016; 03/2019   Bx 2012 showed tubular adenoma but no HG dysplasia.  2017 : adenomatous.  03/2019: adenomatous.  04/2022 adenoma x 3   Asymptomatic cholelithiasis 2017   Carpal tunnel syndrome, bilateral    Diverticulosis 03/2019   Sigmoid (on colonoscopy)   Erectile dysfunction    Flexion contracture of joint of hand, right    ?dupuytrens? Asymptomatic   Hearing loss of both ears    +hearing aids   HTN (hypertension)    Hypothyroidism     Increased prostate specific antigen (PSA) velocity 11/11/2011   Biopsy negative 03/11/12.  Followed by Dr. Renda (mild rise in PSA 2015, but Dr. Renda and pt agreed to continue with annual PSA/DRE, no invasive procedure).  PSA 4.19 04/2017.  Dr. Renda recommended no further PSA testing, just annual DRE.   Paresthesias 2024   All fingers-->gabapentin  helpful   Pleural effusion associated with pulmonary infection 04/2014   PULM: benign parapneumonic effusion; resolved 05/2014   Pulmonary nodule 2015   with subcarinal adenopathy; stable on serial CT scans in 2015 and 09/2015, and 03/2016.  F/u CT 03/2017 stable---MAY BE CONSIDERED BENIGN.  NO FURTHER CT's needed.   Stress fracture of foot 2017   right    Past Surgical History:  Procedure Laterality Date   CARDIOVASCULAR STRESS TEST  07/06/2014   NORMAL Myocardial perfusion imaging, normal EF, normal LV function, no wall motion abnormality   COLONOSCOPY W/ POLYPECTOMY  2012; 01/2016; 03/2019   2017 adenomatous polyp.  Rpt 03/2019->adenomatous polyp.  04/2022 adenoma x 3   KNEE SURGERY     2009, laperoscopic   TONSILLECTOMY  1970   TRIGGER FINGER INJECTION Left 03/29/2020   UMBILICAL HERNIA REPAIR  1980    Outpatient Medications Prior to Visit  Medication Sig Dispense Refill   atorvastatin  (LIPITOR) 10 MG tablet Take 1 tablet (10 mg total) by mouth daily. 90 tablet 1   benzonatate  (TESSALON ) 100 MG capsule Take 1 capsule (100  mg total) by mouth 2 (two) times daily as needed for cough. 30 capsule 0   calcium  carbonate (OS-CAL) 600 MG TABS tablet Take 600 mg by mouth daily. Reported on 02/07/2016     fluticasone  (FLONASE ) 50 MCG/ACT nasal spray SHAKE LIQUID AND USE 2 SPRAYS IN EACH NOSTRIL DAILY 48 g 1   gabapentin  (NEURONTIN ) 300 MG capsule Take 1 capsule (300 mg total) by mouth at bedtime. 90 capsule 1   ibuprofen (ADVIL,MOTRIN) 800 MG tablet Take 800 mg by mouth every 8 (eight) hours as needed for mild pain. Reported on 02/07/2016     levothyroxine   (SYNTHROID ) 137 MCG tablet TAKE 1 TABLET(137 MCG) BY MOUTH DAILY BEFORE BREAKFAST 90 tablet 0   loratadine  (CLARITIN ) 10 MG tablet Take 10 mg by mouth daily. Reported on 02/07/2016     meloxicam  (MOBIC ) 15 MG tablet Take 1 tablet (15 mg total) by mouth daily. 10 tablet 0   Multiple Vitamins-Minerals (CENTRUM SILVER PO) Take 1 tablet by mouth daily. Reported on 02/07/2016     niacin  500 MG tablet Take 500 mg by mouth daily with breakfast. Reported on 02/07/2016     Omega-3 Fatty Acids (FISH OIL) 1000 MG CAPS Take 1 capsule by mouth daily. Reported on 02/07/2016     pantoprazole  (PROTONIX ) 40 MG tablet Take 1 tablet (40 mg total) by mouth daily. 30 tablet 3   Psyllium (METAMUCIL PO) Take 1 scoop by mouth daily. Reported on 02/07/2016     No facility-administered medications prior to visit.    No Known Allergies  Review of Systems As per HPI  PE:    05/03/2024    9:37 AM 11/04/2023    9:17 AM 11/04/2023    9:03 AM  Vitals with BMI  Height 5' 6  5' 6  Weight 168 lbs 3 oz  175 lbs 3 oz  BMI 27.16  28.29  Systolic 136 142 840  Diastolic 69 84 80  Pulse 70  62     Physical Exam  Gen: Alert, well appearing.  Patient is oriented to person, place, time, and situation. AFFECT: pleasant, lucid thought and speech. CV: RRR, no m/r/g.   LUNGS: CTA bilat, nonlabored resps, good aeration in all lung fields. EXT: no clubbing or cyanosis.  no edema.    LABS:  Last CBC Lab Results  Component Value Date   WBC 5.7 08/06/2023   HGB 14.9 08/06/2023   HCT 46.1 08/06/2023   MCV 96.4 08/06/2023   MCH 32.6 03/23/2014   RDW 14.2 08/06/2023   PLT 199.0 08/06/2023   Last metabolic panel Lab Results  Component Value Date   GLUCOSE 106 (H) 08/06/2023   NA 143 08/06/2023   K 4.2 08/06/2023   CL 107 08/06/2023   CO2 29 08/06/2023   BUN 16 08/06/2023   CREATININE 0.87 08/06/2023   GFR 77.76 08/06/2023   CALCIUM  8.9 08/06/2023   PROT 6.5 11/04/2023   ALBUMIN 4.1 11/04/2023   BILITOT 1.0  11/04/2023   ALKPHOS 84 11/04/2023   AST 16 11/04/2023   ALT 12 11/04/2023   Last lipids Lab Results  Component Value Date   CHOL 138 11/04/2023   HDL 46.80 11/04/2023   LDLCALC 75 11/04/2023   TRIG 78.0 11/04/2023   CHOLHDL 3 11/04/2023   Last hemoglobin A1c Lab Results  Component Value Date   HGBA1C 5.4 03/25/2022   Last thyroid  functions Lab Results  Component Value Date   TSH 4.89 08/06/2023   Last vitamin B12 and Folate  Lab Results  Component Value Date   VITAMINB12 1,072 (H) 03/20/2023   IMPRESSION AND PLAN:  #1 History of hypertension, currently on no medications.  He tends to have some orthostatic symptoms at times.  He monitors blood pressure regularly at home and is doing fine.   2.  Hypercholesterolemia, doing well long-term on atorvastatin  10 mg a day. Lipid panel today.  3.  Acquired hypothyroidism.  137 mcg levothyroxine  daily. TSH was 4.89 on 08/06/2023. Monitor TSH today.  An After Visit Summary was printed and given to the patient.  FOLLOW UP: No follow-ups on file.  Signed:  Gerlene Hockey, MD           05/03/2024

## 2024-05-04 ENCOUNTER — Ambulatory Visit: Payer: Self-pay | Admitting: Family Medicine

## 2024-05-04 DIAGNOSIS — R7301 Impaired fasting glucose: Secondary | ICD-10-CM

## 2024-05-11 ENCOUNTER — Other Ambulatory Visit (INDEPENDENT_AMBULATORY_CARE_PROVIDER_SITE_OTHER)

## 2024-05-11 DIAGNOSIS — R7301 Impaired fasting glucose: Secondary | ICD-10-CM

## 2024-05-11 LAB — HEMOGLOBIN A1C: Hgb A1c MFr Bld: 6.3 % (ref 4.6–6.5)

## 2024-05-12 ENCOUNTER — Encounter: Payer: Self-pay | Admitting: Family Medicine

## 2024-05-12 ENCOUNTER — Ambulatory Visit: Payer: Self-pay | Admitting: Family Medicine

## 2024-05-12 ENCOUNTER — Other Ambulatory Visit: Payer: Self-pay | Admitting: Family Medicine

## 2024-05-14 ENCOUNTER — Ambulatory Visit: Payer: Self-pay

## 2024-05-14 NOTE — Telephone Encounter (Signed)
 Sent as FYI.

## 2024-05-14 NOTE — Telephone Encounter (Signed)
 FYI Only or Action Required?: FYI only for provider.  Patient was last seen in primary care on 05/03/2024 by McGowen, Aleene DEL, MD.  Called Nurse Triage reporting Hip Pain.  Symptoms began yesterday.  Interventions attempted: Nothing.  Symptoms are: stable.  Triage Disposition: Home Care-instructions given for care with tylenol  and ibuprofen along with heat placement.   Patient/caregiver understands and will follow disposition?: Yes  Copied from CRM [id number]. Topic: Clinical - Red Word Triage >> May 14, 2024 12:27 PM Franky GRADE wrote: Red Word that prompted transfer to Nurse Triage: Patient is calling because he believes he may have pulled a muscle on his right hip yesterday and the pain is worsening currently from 1-10 an 8. Reason for Disposition  [1] Minor injury or pain from twisting or over-stretching AND [2] walks normally  Answer Assessment - Initial Assessment Questions 1. MECHANISM: How did the injury happen? (e.g., twisting injury, direct blow)      Patient reports walking down stairs into his garage and pulled a muscle in hip 2. ONSET: When did the injury happen? (e.g., minutes, hours ago)      yesterday 3. LOCATION: Where is the injury located?      Right hip 4. APPEARANCE of INJURY: What does the injury look like?  (e.g., deformity of leg)     No deformities 5. SEVERITY: Can you put weight on that leg? Can you walk?      yes 6. SIZE: For cuts, bruises, or swelling, ask: How large is it? (e.g., inches or centimeters;  entire joint)      N/A 7. PAIN: Is there pain? If Yes, ask: How bad is the pain?   What does it keep you from doing? (Scale 0-10; or none, mild, moderate, severe)     8 out of 10 8. TETANUS: For any breaks in the skin, ask: When was your last tetanus booster?     N/A 9. OTHER SYMPTOMS: Do you have any other symptoms?      No other symptoms.  Protocols used: Hip Injury-A-AH

## 2024-07-06 ENCOUNTER — Ambulatory Visit: Admitting: Family Medicine

## 2024-07-06 ENCOUNTER — Encounter: Payer: Self-pay | Admitting: Family Medicine

## 2024-07-06 VITALS — BP 107/66 | HR 100 | Temp 98.0°F | Ht 66.0 in | Wt 164.4 lb

## 2024-07-06 DIAGNOSIS — R3915 Urgency of urination: Secondary | ICD-10-CM | POA: Diagnosis not present

## 2024-07-06 DIAGNOSIS — N410 Acute prostatitis: Secondary | ICD-10-CM

## 2024-07-06 MED ORDER — LEVOFLOXACIN 500 MG PO TABS: 500.0000 mg | ORAL_TABLET | Freq: Every day | ORAL | 0 refills | Status: AC

## 2024-07-06 NOTE — Progress Notes (Signed)
 OFFICE VISIT  07/06/2024  CC:  Chief Complaint  Patient presents with   Urinary Concern    Pt has been having urinary urgency x 2 days ago   Patient is a 89 y.o. male who presents for urinary concern.  INTERIM HX: For the last 2 nights Joel Kelley has had very uncomfortable urinary urgency and nocturia, burning with urination.  He has pain and discomfort under the testicles at the base of the bladder/prostate. He has never had a prostate infection that he knows of. He is usual number of times getting up to urinate is 4 times per night.  He is usually able to get back to sleep without problem. He has no daytime lower urinary tract obstructive symptoms.  No fever, no nausea, no vomiting, no flank pain.    Past Medical History:  Diagnosis Date   Adenomatous colon polyp 2012; 01/2016; 03/2019   Bx 2012 showed tubular adenoma but no HG dysplasia.  2017 : adenomatous.  03/2019: adenomatous.  04/2022 adenoma x 3   Asymptomatic cholelithiasis 2017   Carpal tunnel syndrome, bilateral    Diverticulosis 03/2019   Sigmoid (on colonoscopy)   Erectile dysfunction    Flexion contracture of joint of hand, right    ?dupuytrens? Asymptomatic   Hearing loss of both ears    +hearing aids   HTN (hypertension)    Hypothyroidism    Increased prostate specific antigen (PSA) velocity 11/11/2011   Biopsy negative 03/11/12.  Followed by Dr. Renda (mild rise in PSA 2015, but Dr. Renda and pt agreed to continue with annual PSA/DRE, no invasive procedure).  PSA 4.19 04/2017.  Dr. Renda recommended no further PSA testing, just annual DRE.   Paresthesias 2024   All fingers-->gabapentin  helpful   Pleural effusion associated with pulmonary infection 04/2014   PULM: benign parapneumonic effusion; resolved 05/2014   Prediabetes    July 2025 hemoglobin A1c 6.3%   Pulmonary nodule 2015   with subcarinal adenopathy; stable on serial CT scans in 2015 and 09/2015, and 03/2016.  F/u CT 03/2017 stable---MAY BE CONSIDERED  BENIGN.  NO FURTHER CT's needed.   Stress fracture of foot 2017   right    Past Surgical History:  Procedure Laterality Date   CARDIOVASCULAR STRESS TEST  07/06/2014   NORMAL Myocardial perfusion imaging, normal EF, normal LV function, no wall motion abnormality   COLONOSCOPY W/ POLYPECTOMY  2012; 01/2016; 03/2019   2017 adenomatous polyp.  Rpt 03/2019->adenomatous polyp.  04/2022 adenoma x 3   KNEE SURGERY     2009, laperoscopic   TONSILLECTOMY  1970   TRIGGER FINGER INJECTION Left 03/29/2020   UMBILICAL HERNIA REPAIR  1980    Outpatient Medications Prior to Visit  Medication Sig Dispense Refill   atorvastatin  (LIPITOR) 10 MG tablet TAKE 1 TABLET(10 MG) BY MOUTH DAILY 90 tablet 1   benzonatate  (TESSALON ) 100 MG capsule Take 1 capsule (100 mg total) by mouth 2 (two) times daily as needed for cough. 30 capsule 0   calcium  carbonate (OS-CAL) 600 MG TABS tablet Take 600 mg by mouth daily. Reported on 02/07/2016     fluticasone  (FLONASE ) 50 MCG/ACT nasal spray SHAKE LIQUID AND USE 2 SPRAYS IN EACH NOSTRIL DAILY 48 g 1   gabapentin  (NEURONTIN ) 300 MG capsule Take 1 capsule (300 mg total) by mouth at bedtime. 90 capsule 1   ibuprofen (ADVIL,MOTRIN) 800 MG tablet Take 800 mg by mouth every 8 (eight) hours as needed for mild pain. Reported on 02/07/2016  levothyroxine  (SYNTHROID ) 137 MCG tablet Take 1 tablet (137 mcg total) by mouth daily before breakfast. 90 tablet 3   loratadine  (CLARITIN ) 10 MG tablet Take 10 mg by mouth daily. Reported on 02/07/2016     meloxicam  (MOBIC ) 15 MG tablet Take 1 tablet (15 mg total) by mouth daily. 10 tablet 0   Multiple Vitamins-Minerals (CENTRUM SILVER PO) Take 1 tablet by mouth daily. Reported on 02/07/2016     niacin  500 MG tablet Take 500 mg by mouth daily with breakfast. Reported on 02/07/2016     Omega-3 Fatty Acids (FISH OIL) 1000 MG CAPS Take 1 capsule by mouth daily. Reported on 02/07/2016     pantoprazole  (PROTONIX ) 40 MG tablet Take 1 tablet (40 mg total) by  mouth daily. 30 tablet 3   Psyllium (METAMUCIL PO) Take 1 scoop by mouth daily. Reported on 02/07/2016     No facility-administered medications prior to visit.    No Known Allergies  Review of Systems As per HPI  PE:    07/06/2024   10:43 AM 05/03/2024    9:37 AM 11/04/2023    9:17 AM  Vitals with BMI  Height 5' 6 5' 6   Weight 164 lbs 6 oz 168 lbs 3 oz   BMI 26.55 27.16   Systolic 107 136 857  Diastolic 66 69 84  Pulse 100 70      Physical Exam  Gen: Alert, well appearing.  Patient is oriented to person, place, time, and situation. AFFECT: pleasant, lucid thought and speech. Rectal exam today: Markedly enlarged and boggy prostate.  Prostate gland is significantly tender to palpation.  No nodule or asymmetry.  LABS:  Last metabolic panel Lab Results  Component Value Date   GLUCOSE 110 (H) 05/03/2024   NA 143 05/03/2024   K 4.1 05/03/2024   CL 107 05/03/2024   CO2 27 05/03/2024   BUN 18 05/03/2024   CREATININE 0.95 05/03/2024   GFR 71.76 05/03/2024   CALCIUM  8.6 05/03/2024   PROT 6.5 11/04/2023   ALBUMIN 4.1 11/04/2023   BILITOT 1.0 11/04/2023   ALKPHOS 84 11/04/2023   AST 16 11/04/2023   ALT 12 11/04/2023   IMPRESSION AND PLAN:  Acute prostatitis. Levaquin  500 mg a day x 14 days prescribed today. He was not able to give a urine specimen today.  An After Visit Summary was printed and given to the patient.  FOLLOW UP: Return if symptoms worsen or fail to improve.  Signed:  Gerlene Hockey, MD           07/06/2024

## 2024-07-20 ENCOUNTER — Ambulatory Visit: Payer: Self-pay

## 2024-07-20 NOTE — Telephone Encounter (Signed)
 FYI Only or Action Required?: FYI only for provider.  Patient was last seen in primary care on 07/06/2024 by McGowen, Aleene DEL, MD.  Called Nurse Triage reporting Urinary Frequency.  Symptoms began several weeks ago.  Interventions attempted: Prescription medications: Levaquin  500 MG.  Symptoms are: unchanged.  Triage Disposition: See PCP Within 2 Weeks  Patient/caregiver understands and will follow disposition?: Yes Reason for Disposition  Has to get out of bed to urinate > 2 times a night (i.e., nocturia)  Answer Assessment - Initial Assessment Questions Finished Levaquin  500mg  on 9/15, still having the uncontrollable urgency just at night.  1. SYMPTOM: What's the main symptom you're concerned about? (e.g., frequency, incontinence)     Urgency, only happening at night  2. ONSET: When did the  Frequency  start?     2 weeks ago  3. PAIN: Is there any pain? If Yes, ask: How bad is it? (Scale: 1-10; mild, moderate, severe)     No  4. CAUSE: What do you think is causing the symptoms?     Unsure  5. OTHER SYMPTOMS: Do you have any other symptoms? (e.g., blood in urine, fever, flank pain, pain with urination)     No  Protocols used: Urinary Symptoms-A-AH  Copied from CRM #[id number]. Topic: Clinical - Medication Question >> Jul 20, 2024  3:29 PM Drema MATSU wrote: Reason for CRM: Patient finished levofloxacin  (LEVAQUIN ) 500 MG tablet [but is still leaking a little in the night. Patient wants to know if something else can be called in.

## 2024-07-20 NOTE — Telephone Encounter (Signed)
 noted

## 2024-07-22 ENCOUNTER — Encounter: Payer: Self-pay | Admitting: Family Medicine

## 2024-07-22 ENCOUNTER — Ambulatory Visit: Admitting: Family Medicine

## 2024-07-22 VITALS — BP 129/63 | HR 65 | Temp 97.5°F | Ht 66.0 in | Wt 170.0 lb

## 2024-07-22 DIAGNOSIS — R351 Nocturia: Secondary | ICD-10-CM | POA: Diagnosis not present

## 2024-07-22 DIAGNOSIS — N401 Enlarged prostate with lower urinary tract symptoms: Secondary | ICD-10-CM | POA: Diagnosis not present

## 2024-07-22 MED ORDER — TAMSULOSIN HCL 0.4 MG PO CAPS
ORAL_CAPSULE | ORAL | 0 refills | Status: DC
Start: 2024-07-22 — End: 2024-08-06

## 2024-07-22 NOTE — Progress Notes (Signed)
 OFFICE VISIT  07/22/2024  CC:  Chief Complaint  Patient presents with   Urinary Concern    Pt c/o frequent urination at night about every 3 hours    Patient is a 89 y.o. male who presents for 2-week follow-up of acute prostatitis. A/P as of last visit: Acute prostatitis. Levaquin  500 mg a day x 14 days prescribed today. He was not able to give a urine specimen today.  INTERIM HX: All of his and his are better except he still has significant nocturia.  No rectal/anal pain, no dysuria, no blood in urine. He does leak urine at night. No daytime urgency or obstructive symptoms.  Past Medical History:  Diagnosis Date   Adenomatous colon polyp 2012; 01/2016; 03/2019   Bx 2012 showed tubular adenoma but no HG dysplasia.  2017 : adenomatous.  03/2019: adenomatous.  04/2022 adenoma x 3   Arthritis In hands   Asymptomatic cholelithiasis 2017   Carpal tunnel syndrome, bilateral    Diverticulosis 03/2019   Sigmoid (on colonoscopy)   Erectile dysfunction    Flexion contracture of joint of hand, right    ?dupuytrens? Asymptomatic   Hearing loss of both ears    +hearing aids   HTN (hypertension)    Hypothyroidism    Increased prostate specific antigen (PSA) velocity 11/11/2011   Biopsy negative 03/11/12.  Followed by Dr. Renda (mild rise in PSA 2015, but Dr. Renda and pt agreed to continue with annual PSA/DRE, no invasive procedure).  PSA 4.19 04/2017.  Dr. Renda recommended no further PSA testing, just annual DRE.   Paresthesias 2024   All fingers-->gabapentin  helpful   Pleural effusion associated with pulmonary infection 04/2014   PULM: benign parapneumonic effusion; resolved 05/2014   Prediabetes    July 2025 hemoglobin A1c 6.3%   Pulmonary nodule 2015   with subcarinal adenopathy; stable on serial CT scans in 2015 and 09/2015, and 03/2016.  F/u CT 03/2017 stable---MAY BE CONSIDERED BENIGN.  NO FURTHER CT's needed.   Stress fracture of foot 2017   right    Past Surgical History:   Procedure Laterality Date   CARDIOVASCULAR STRESS TEST  07/06/2014   NORMAL Myocardial perfusion imaging, normal EF, normal LV function, no wall motion abnormality   COLONOSCOPY W/ POLYPECTOMY  2012; 01/2016; 03/2019   2017 adenomatous polyp.  Rpt 03/2019->adenomatous polyp.  04/2022 adenoma x 3   EYE SURGERY  Cataracts   KNEE SURGERY     2009, laperoscopic   TONSILLECTOMY  1970   TRIGGER FINGER INJECTION Left 03/29/2020   UMBILICAL HERNIA REPAIR  1980    Outpatient Medications Prior to Visit  Medication Sig Dispense Refill   atorvastatin  (LIPITOR) 10 MG tablet TAKE 1 TABLET(10 MG) BY MOUTH DAILY 90 tablet 1   benzonatate  (TESSALON ) 100 MG capsule Take 1 capsule (100 mg total) by mouth 2 (two) times daily as needed for cough. 30 capsule 0   calcium  carbonate (OS-CAL) 600 MG TABS tablet Take 600 mg by mouth daily. Reported on 02/07/2016     fluticasone  (FLONASE ) 50 MCG/ACT nasal spray SHAKE LIQUID AND USE 2 SPRAYS IN EACH NOSTRIL DAILY 48 g 1   gabapentin  (NEURONTIN ) 300 MG capsule Take 1 capsule (300 mg total) by mouth at bedtime. 90 capsule 1   ibuprofen (ADVIL,MOTRIN) 800 MG tablet Take 800 mg by mouth every 8 (eight) hours as needed for mild pain. Reported on 02/07/2016     levothyroxine  (SYNTHROID ) 137 MCG tablet Take 1 tablet (137 mcg total) by mouth daily  before breakfast. 90 tablet 3   loratadine  (CLARITIN ) 10 MG tablet Take 10 mg by mouth daily. Reported on 02/07/2016     meloxicam  (MOBIC ) 15 MG tablet Take 1 tablet (15 mg total) by mouth daily. 10 tablet 0   Multiple Vitamins-Minerals (CENTRUM SILVER PO) Take 1 tablet by mouth daily. Reported on 02/07/2016     niacin  500 MG tablet Take 500 mg by mouth daily with breakfast. Reported on 02/07/2016     Omega-3 Fatty Acids (FISH OIL) 1000 MG CAPS Take 1 capsule by mouth daily. Reported on 02/07/2016     pantoprazole  (PROTONIX ) 40 MG tablet Take 1 tablet (40 mg total) by mouth daily. 30 tablet 3   Psyllium (METAMUCIL PO) Take 1 scoop by mouth  daily. Reported on 02/07/2016     No facility-administered medications prior to visit.    No Known Allergies  Review of Systems As per HPI  PE:    07/22/2024   10:52 AM 07/06/2024   10:43 AM 05/03/2024    9:37 AM  Vitals with BMI  Height 5' 6 5' 6 5' 6  Weight 170 lbs 164 lbs 6 oz 168 lbs 3 oz  BMI 27.45 26.55 27.16  Systolic 129 107 863  Diastolic 63 66 69  Pulse 65 100 70     Physical Exam  Gen: Alert, well appearing.  Patient is oriented to person, place, time, and situation. AFFECT: pleasant, lucid thought and speech. No further exam today  LABS:  Last CBC Lab Results  Component Value Date   WBC 5.7 08/06/2023   HGB 14.9 08/06/2023   HCT 46.1 08/06/2023   MCV 96.4 08/06/2023   MCH 32.6 03/23/2014   RDW 14.2 08/06/2023   PLT 199.0 08/06/2023   Last metabolic panel Lab Results  Component Value Date   GLUCOSE 110 (H) 05/03/2024   NA 143 05/03/2024   K 4.1 05/03/2024   CL 107 05/03/2024   CO2 27 05/03/2024   BUN 18 05/03/2024   CREATININE 0.95 05/03/2024   GFR 71.76 05/03/2024   CALCIUM  8.6 05/03/2024   PROT 6.5 11/04/2023   ALBUMIN 4.1 11/04/2023   BILITOT 1.0 11/04/2023   ALKPHOS 84 11/04/2023   AST 16 11/04/2023   ALT 12 11/04/2023   Lab Results  Component Value Date   HGBA1C 6.3 05/11/2024   Lab Results  Component Value Date   PSA 3.94 11/11/2011   PSA 2.61 12/17/2010   IMPRESSION AND PLAN:  BPH with nocturia. Start trial of Flomax  0.4 mg nightly.  If not improving in 4 to 5 days then increase to 0.8 mg. Consider finasteride if not significantly improved at follow-up in 2 weeks.  An After Visit Summary was printed and given to the patient.  FOLLOW UP: Return in about 2 weeks (around 08/05/2024) for f/u nocturia.  Signed:  Gerlene Hockey, MD           07/22/2024

## 2024-08-06 ENCOUNTER — Encounter: Payer: Self-pay | Admitting: Family Medicine

## 2024-08-06 ENCOUNTER — Ambulatory Visit: Admitting: Family Medicine

## 2024-08-06 VITALS — BP 108/57 | HR 77 | Temp 97.5°F | Ht 66.0 in | Wt 164.4 lb

## 2024-08-06 DIAGNOSIS — R351 Nocturia: Secondary | ICD-10-CM | POA: Diagnosis not present

## 2024-08-06 DIAGNOSIS — N401 Enlarged prostate with lower urinary tract symptoms: Secondary | ICD-10-CM

## 2024-08-06 MED ORDER — FINASTERIDE 5 MG PO TABS: 5.0000 mg | ORAL_TABLET | Freq: Every day | ORAL | 1 refills | Status: DC

## 2024-08-06 MED ORDER — TAMSULOSIN HCL 0.4 MG PO CAPS: ORAL_CAPSULE | ORAL | 1 refills | Status: DC

## 2024-08-06 NOTE — Progress Notes (Signed)
 OFFICE VISIT  08/06/2024  CC:  Chief Complaint  Patient presents with   Follow-up    2 week f/u nocturia; pt stopped medication 14 days ago due to ineffectiveness     Patient is a 89 y.o. male who presents for 2-week follow-up BPH with nocturia. A/P as of last visit: BPH with nocturia. Start trial of Flomax  0.4 mg nightly.  If not improving in 4 to 5 days then increase to 0.8 mg. Consider finasteride if not significantly improved at follow-up in 2 weeks.  INTERIM HX: Rutha says he has improved a little bit.  He is taking one of the Flomax  per night. He still gets up every 2 hours to pee but says there is less leakage in between (he used to use for bed pads per night and now is using about 1). He has no daytime voiding symptoms at all.  No pain with voiding.  No blood in urine. He  Past Medical History:  Diagnosis Date   Adenomatous colon polyp 2012; 01/2016; 03/2019   Bx 2012 showed tubular adenoma but no HG dysplasia.  2017 : adenomatous.  03/2019: adenomatous.  04/2022 adenoma x 3   Arthritis In hands   Asymptomatic cholelithiasis 2017   Carpal tunnel syndrome, bilateral    Diverticulosis 03/2019   Sigmoid (on colonoscopy)   Erectile dysfunction    Flexion contracture of joint of hand, right    ?dupuytrens? Asymptomatic   Hearing loss of both ears    +hearing aids   HTN (hypertension)    Hypothyroidism    Increased prostate specific antigen (PSA) velocity 11/11/2011   Biopsy negative 03/11/12.  Followed by Dr. Renda (mild rise in PSA 2015, but Dr. Renda and pt agreed to continue with annual PSA/DRE, no invasive procedure).  PSA 4.19 04/2017.  Dr. Renda recommended no further PSA testing, just annual DRE.   Paresthesias 2024   All fingers-->gabapentin  helpful   Pleural effusion associated with pulmonary infection 04/2014   PULM: benign parapneumonic effusion; resolved 05/2014   Prediabetes    July 2025 hemoglobin A1c 6.3%   Pulmonary nodule 2015   with subcarinal  adenopathy; stable on serial CT scans in 2015 and 09/2015, and 03/2016.  F/u CT 03/2017 stable---MAY BE CONSIDERED BENIGN.  NO FURTHER CT's needed.   Stress fracture of foot 2017   right    Past Surgical History:  Procedure Laterality Date   CARDIOVASCULAR STRESS TEST  07/06/2014   NORMAL Myocardial perfusion imaging, normal EF, normal LV function, no wall motion abnormality   COLONOSCOPY W/ POLYPECTOMY  2012; 01/2016; 03/2019   2017 adenomatous polyp.  Rpt 03/2019->adenomatous polyp.  04/2022 adenoma x 3   EYE SURGERY  Cataracts   KNEE SURGERY     2009, laperoscopic   TONSILLECTOMY  1970   TRIGGER FINGER INJECTION Left 03/29/2020   UMBILICAL HERNIA REPAIR  1980    Outpatient Medications Prior to Visit  Medication Sig Dispense Refill   atorvastatin  (LIPITOR) 10 MG tablet TAKE 1 TABLET(10 MG) BY MOUTH DAILY 90 tablet 1   benzonatate  (TESSALON ) 100 MG capsule Take 1 capsule (100 mg total) by mouth 2 (two) times daily as needed for cough. 30 capsule 0   calcium  carbonate (OS-CAL) 600 MG TABS tablet Take 600 mg by mouth daily. Reported on 02/07/2016     fluticasone  (FLONASE ) 50 MCG/ACT nasal spray SHAKE LIQUID AND USE 2 SPRAYS IN EACH NOSTRIL DAILY 48 g 1   gabapentin  (NEURONTIN ) 300 MG capsule Take 1 capsule (300 mg  total) by mouth at bedtime. 90 capsule 1   ibuprofen (ADVIL,MOTRIN) 800 MG tablet Take 800 mg by mouth every 8 (eight) hours as needed for mild pain. Reported on 02/07/2016     levothyroxine  (SYNTHROID ) 137 MCG tablet Take 1 tablet (137 mcg total) by mouth daily before breakfast. 90 tablet 3   loratadine  (CLARITIN ) 10 MG tablet Take 10 mg by mouth daily. Reported on 02/07/2016     meloxicam  (MOBIC ) 15 MG tablet Take 1 tablet (15 mg total) by mouth daily. 10 tablet 0   Multiple Vitamins-Minerals (CENTRUM SILVER PO) Take 1 tablet by mouth daily. Reported on 02/07/2016     niacin  500 MG tablet Take 500 mg by mouth daily with breakfast. Reported on 02/07/2016     Omega-3 Fatty Acids (FISH OIL)  1000 MG CAPS Take 1 capsule by mouth daily. Reported on 02/07/2016     pantoprazole  (PROTONIX ) 40 MG tablet Take 1 tablet (40 mg total) by mouth daily. 30 tablet 3   Psyllium (METAMUCIL PO) Take 1 scoop by mouth daily. Reported on 02/07/2016     tamsulosin  (FLOMAX ) 0.4 MG CAPS capsule 1 tab po qhs (Patient not taking: Reported on 08/06/2024) 30 capsule 0   No facility-administered medications prior to visit.    No Known Allergies  Review of Systems As per HPI  PE:    08/06/2024    9:51 AM 07/22/2024   10:52 AM 07/06/2024   10:43 AM  Vitals with BMI  Height 5' 6 5' 6 5' 6  Weight 164 lbs 6 oz 170 lbs 164 lbs 6 oz  BMI 26.55 27.45 26.55  Systolic 108 129 892  Diastolic 57 63 66  Pulse 77 65 100   Physical Exam  General: Alert and well-appearing. No further exam today.  LABS:  Last metabolic panel Lab Results  Component Value Date   GLUCOSE 110 (H) 05/03/2024   NA 143 05/03/2024   K 4.1 05/03/2024   CL 107 05/03/2024   CO2 27 05/03/2024   BUN 18 05/03/2024   CREATININE 0.95 05/03/2024   GFR 71.76 05/03/2024   CALCIUM  8.6 05/03/2024   PROT 6.5 11/04/2023   ALBUMIN 4.1 11/04/2023   BILITOT 1.0 11/04/2023   ALKPHOS 84 11/04/2023   AST 16 11/04/2023   ALT 12 11/04/2023   IMPRESSION AND PLAN:  BPH with nocturia. Minimal improvement with Flomax  0.4 mg nightly. Increase to 0.8 mg nightly and add finasteride 5 mg a day. If not significantly improved in 1 month will refer to urology.  An After Visit Summary was printed and given to the patient.  FOLLOW UP: Return in about 4 weeks (around 09/03/2024) for Follow-up BPH with nocturia.  Signed:  Gerlene Hockey, MD           08/06/2024

## 2024-08-11 ENCOUNTER — Telehealth: Payer: Self-pay

## 2024-08-11 NOTE — Telephone Encounter (Signed)
 LVM to inform pt that we have flu shots available and can schedule for flu clinic.

## 2024-09-09 ENCOUNTER — Ambulatory Visit: Admitting: Family Medicine

## 2024-09-09 ENCOUNTER — Encounter: Payer: Self-pay | Admitting: Family Medicine

## 2024-09-09 VITALS — BP 114/64 | HR 79 | Temp 97.2°F | Wt 166.2 lb

## 2024-09-09 DIAGNOSIS — R7303 Prediabetes: Secondary | ICD-10-CM | POA: Diagnosis not present

## 2024-09-09 DIAGNOSIS — N401 Enlarged prostate with lower urinary tract symptoms: Secondary | ICD-10-CM

## 2024-09-09 DIAGNOSIS — R351 Nocturia: Secondary | ICD-10-CM

## 2024-09-09 LAB — POCT GLYCOSYLATED HEMOGLOBIN (HGB A1C)
HbA1c POC (<> result, manual entry): 5.8 % (ref 4.0–5.6)
HbA1c, POC (controlled diabetic range): 5.8 % (ref 0.0–7.0)
HbA1c, POC (prediabetic range): 5.8 % (ref 5.7–6.4)
Hemoglobin A1C: 5.8 % — AB (ref 4.0–5.6)

## 2024-09-09 NOTE — Progress Notes (Signed)
 OFFICE VISIT  09/09/2024  CC:  Chief Complaint  Patient presents with   Follow-up    Patient is a 89 y.o. male who presents for 1 month follow-up BPH and prediabetes. A/P as of last visit: BPH with nocturia. Minimal improvement with Flomax  0.4 mg nightly. Increase to 0.8 mg nightly and add finasteride 5 mg a day. If not significantly improved in 1 month will refer to urology.  INTERIM HX: Joel Kelley is happy to say he is doing great. A few days after he got on the increased dose of Flomax  and added the finasteride he noted much less nocturia.  Voids well and has no voiding discomfort. No adverse side effects.   Past Medical History:  Diagnosis Date   Adenomatous colon polyp 2012; 01/2016; 03/2019   Bx 2012 showed tubular adenoma but no HG dysplasia.  2017 : adenomatous.  03/2019: adenomatous.  04/2022 adenoma x 3   Arthritis In hands   Asymptomatic cholelithiasis 2017   Carpal tunnel syndrome, bilateral    Diverticulosis 03/2019   Sigmoid (on colonoscopy)   Erectile dysfunction    Flexion contracture of joint of hand, right    ?dupuytrens? Asymptomatic   Hearing loss of both ears    +hearing aids   HTN (hypertension)    Hypothyroidism    Increased prostate specific antigen (PSA) velocity 11/11/2011   Biopsy negative 03/11/12.  Followed by Dr. Renda (mild rise in PSA 2015, but Dr. Renda and pt agreed to continue with annual PSA/DRE, no invasive procedure).  PSA 4.19 04/2017.  Dr. Renda recommended no further PSA testing, just annual DRE.   Paresthesias 2024   All fingers-->gabapentin  helpful   Pleural effusion associated with pulmonary infection 04/2014   PULM: benign parapneumonic effusion; resolved 05/2014   Prediabetes    July 2025 hemoglobin A1c 6.3%   Pulmonary nodule 2015   with subcarinal adenopathy; stable on serial CT scans in 2015 and 09/2015, and 03/2016.  F/u CT 03/2017 stable---MAY BE CONSIDERED BENIGN.  NO FURTHER CT's needed.   Stress fracture of foot 2017    right    Past Surgical History:  Procedure Laterality Date   CARDIOVASCULAR STRESS TEST  07/06/2014   NORMAL Myocardial perfusion imaging, normal EF, normal LV function, no wall motion abnormality   COLONOSCOPY W/ POLYPECTOMY  2012; 01/2016; 03/2019   2017 adenomatous polyp.  Rpt 03/2019->adenomatous polyp.  04/2022 adenoma x 3   EYE SURGERY  Cataracts   KNEE SURGERY     2009, laperoscopic   TONSILLECTOMY  1970   TRIGGER FINGER INJECTION Left 03/29/2020   UMBILICAL HERNIA REPAIR  1980    Outpatient Medications Prior to Visit  Medication Sig Dispense Refill   atorvastatin  (LIPITOR) 10 MG tablet TAKE 1 TABLET(10 MG) BY MOUTH DAILY 90 tablet 1   benzonatate  (TESSALON ) 100 MG capsule Take 1 capsule (100 mg total) by mouth 2 (two) times daily as needed for cough. 30 capsule 0   calcium  carbonate (OS-CAL) 600 MG TABS tablet Take 600 mg by mouth daily. Reported on 02/07/2016     finasteride (PROSCAR) 5 MG tablet Take 1 tablet (5 mg total) by mouth daily. 30 tablet 1   fluticasone  (FLONASE ) 50 MCG/ACT nasal spray SHAKE LIQUID AND USE 2 SPRAYS IN EACH NOSTRIL DAILY 48 g 1   gabapentin  (NEURONTIN ) 300 MG capsule Take 1 capsule (300 mg total) by mouth at bedtime. 90 capsule 1   ibuprofen (ADVIL,MOTRIN) 800 MG tablet Take 800 mg by mouth every 8 (eight) hours as  needed for mild pain. Reported on 02/07/2016     levothyroxine  (SYNTHROID ) 137 MCG tablet Take 1 tablet (137 mcg total) by mouth daily before breakfast. 90 tablet 3   loratadine  (CLARITIN ) 10 MG tablet Take 10 mg by mouth daily. Reported on 02/07/2016     meloxicam  (MOBIC ) 15 MG tablet Take 1 tablet (15 mg total) by mouth daily. 10 tablet 0   Multiple Vitamins-Minerals (CENTRUM SILVER PO) Take 1 tablet by mouth daily. Reported on 02/07/2016     niacin  500 MG tablet Take 500 mg by mouth daily with breakfast. Reported on 02/07/2016     Omega-3 Fatty Acids (FISH OIL) 1000 MG CAPS Take 1 capsule by mouth daily. Reported on 02/07/2016     pantoprazole   (PROTONIX ) 40 MG tablet Take 1 tablet (40 mg total) by mouth daily. 30 tablet 3   Psyllium (METAMUCIL PO) Take 1 scoop by mouth daily. Reported on 02/07/2016     tamsulosin  (FLOMAX ) 0.4 MG CAPS capsule 2 tabs po qhs 60 capsule 1   No facility-administered medications prior to visit.    No Known Allergies  Review of Systems As per HPI  PE:    09/09/2024    9:48 AM 08/06/2024    9:51 AM 07/22/2024   10:52 AM  Vitals with BMI  Height  5' 6 5' 6  Weight 166 lbs 3 oz 164 lbs 6 oz 170 lbs  BMI  26.55 27.45  Systolic 114 108 870  Diastolic 64 57 63  Pulse 79 77 65     Physical Exam  Gen: Alert, well appearing.  Patient is oriented to person, place, time, and situation. AFFECT: pleasant, lucid thought and speech. No further exam today  LABS:  Last metabolic panel Lab Results  Component Value Date   GLUCOSE 110 (H) 05/03/2024   NA 143 05/03/2024   K 4.1 05/03/2024   CL 107 05/03/2024   CO2 27 05/03/2024   BUN 18 05/03/2024   CREATININE 0.95 05/03/2024   GFR 71.76 05/03/2024   CALCIUM  8.6 05/03/2024   PROT 6.5 11/04/2023   ALBUMIN 4.1 11/04/2023   BILITOT 1.0 11/04/2023   ALKPHOS 84 11/04/2023   AST 16 11/04/2023   ALT 12 11/04/2023   Last hemoglobin A1c Lab Results  Component Value Date   HGBA1C 6.3 05/11/2024   IMPRESSION AND PLAN:  #1 BPH with nocturia. Great improvement with 0.8 mg Flomax  nightly and 5 mg finasteride daily. Continue.  2.  Prediabetes: Hemoglobin A1c about 4 months ago had risen to 6.3%. Hemoglobin A1c today is significantly improved to 5.8%.  An After Visit Summary was printed and given to the patient.  FOLLOW UP: Return for Keep appointment scheduled for 11/03/2024.  Signed:  Gerlene Hockey, MD           09/09/2024

## 2024-09-29 ENCOUNTER — Other Ambulatory Visit: Payer: Self-pay | Admitting: Family Medicine

## 2024-10-07 ENCOUNTER — Other Ambulatory Visit: Payer: Self-pay | Admitting: Family Medicine

## 2024-10-31 ENCOUNTER — Other Ambulatory Visit: Payer: Self-pay | Admitting: Family Medicine

## 2024-11-02 NOTE — Progress Notes (Deleted)
 "     Office Note 11/02/2024  CC: No chief complaint on file.  Patient is a 89 y.o. male who is here for annual health maintenance exam and follow-up hypothyroidism and hypercholesterolemia. A/P as of last visit on 09/09/2024: 1 BPH with nocturia. Great improvement with 0.8 mg Flomax  nightly and 5 mg finasteride  daily. Continue.   2.  Prediabetes: Hemoglobin A1c about 4 months ago had risen to 6.3%. Hemoglobin A1c today is significantly improved to 5.8%.  INTERIM HX: ***    Past Medical History:  Diagnosis Date   Adenomatous colon polyp 2012; 01/2016; 03/2019   Bx 2012 showed tubular adenoma but no HG dysplasia.  2017 : adenomatous.  03/2019: adenomatous.  04/2022 adenoma x 3   Arthritis In hands   Asymptomatic cholelithiasis 2017   Carpal tunnel syndrome, bilateral    Diverticulosis 03/2019   Sigmoid (on colonoscopy)   Erectile dysfunction    Flexion contracture of joint of hand, right    ?dupuytrens? Asymptomatic   Hearing loss of both ears    +hearing aids   HTN (hypertension)    Hypothyroidism    Increased prostate specific antigen (PSA) velocity 11/11/2011   Biopsy negative 03/11/12.  Followed by Dr. Renda (mild rise in PSA 2015, but Dr. Renda and pt agreed to continue with annual PSA/DRE, no invasive procedure).  PSA 4.19 04/2017.  Dr. Renda recommended no further PSA testing, just annual DRE.   Paresthesias 2024   All fingers-->gabapentin  helpful   Pleural effusion associated with pulmonary infection 04/2014   PULM: benign parapneumonic effusion; resolved 05/2014   Prediabetes    July 2025 hemoglobin A1c 6.3%   Pulmonary nodule 2015   with subcarinal adenopathy; stable on serial CT scans in 2015 and 09/2015, and 03/2016.  F/u CT 03/2017 stable---MAY BE CONSIDERED BENIGN.  NO FURTHER CT's needed.   Stress fracture of foot 2017   right    Past Surgical History:  Procedure Laterality Date   CARDIOVASCULAR STRESS TEST  07/06/2014   NORMAL Myocardial perfusion imaging,  normal EF, normal LV function, no wall motion abnormality   COLONOSCOPY W/ POLYPECTOMY  2012; 01/2016; 03/2019   2017 adenomatous polyp.  Rpt 03/2019->adenomatous polyp.  04/2022 adenoma x 3   EYE SURGERY  Cataracts   KNEE SURGERY     2009, laperoscopic   TONSILLECTOMY  1970   TRIGGER FINGER INJECTION Left 03/29/2020   UMBILICAL HERNIA REPAIR  1980    Family History  Problem Relation Age of Onset   Hypertension Mother    Hypertension Father     Social History   Socioeconomic History   Marital status: Married    Spouse name: Adrien   Number of children: Not on file   Years of education: Not on file   Highest education level: 12th grade  Occupational History   Occupation: Retired research officer, political party   Tobacco Use   Smoking status: Former    Current packs/day: 0.00    Average packs/day: 0.3 packs/day for 40.0 years (10.0 ttl pk-yrs)    Types: Cigarettes    Start date: 11/04/1948    Quit date: 11/04/1988    Years since quitting: 36.0   Smokeless tobacco: Never  Substance and Sexual Activity   Alcohol use: No    Comment: quit in 1990    Drug use: No   Sexual activity: Not on file  Other Topics Concern   Not on file  Social History Narrative   Divorced, remarried, one son and one step daughter (  step-daughter is local), 2 grandchildren.   Relocated from [place]  about 2010.   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   Distant tobacco abuse: avg 5 cigs day for 20 yrs on and off, quit for good around 1990.   No alcohol.   Daily walking at the park.   Social Drivers of Health   Tobacco Use: Medium Risk (09/09/2024)   Patient History    Smoking Tobacco Use: Former    Smokeless Tobacco Use: Never    Passive Exposure: Not on file  Financial Resource Strain: Low Risk (09/05/2024)   Overall Financial Resource Strain (CARDIA)    Difficulty of Paying Living Expenses: Not hard at all  Food Insecurity: No Food Insecurity (09/05/2024)   Epic    Worried About Programme Researcher, Broadcasting/film/video in the Last Year: Never  true    Ran Out of Food in the Last Year: Never true  Transportation Needs: No Transportation Needs (09/05/2024)   Epic    Lack of Transportation (Medical): No    Lack of Transportation (Non-Medical): No  Physical Activity: Sufficiently Active (09/05/2024)   Exercise Vital Sign    Days of Exercise per Week: 6 days    Minutes of Exercise per Session: 90 min  Stress: No Stress Concern Present (09/05/2024)   [employer] of Occupational Health - Occupational Stress Questionnaire    Feeling of Stress: Not at all  Social Connections: Socially Integrated (09/05/2024)   Social Connection and Isolation Panel    Frequency of Communication with Friends and Family: More than three times a week    Frequency of Social Gatherings with Friends and Family: Three times a week    Attends Religious Services: More than 4 times per year    Active Member of Clubs or Organizations: Yes    Attends Banker Meetings: 1 to 4 times per year    Marital Status: Married  Catering Manager Violence: Not on file  Depression (PHQ2-9): Low Risk (07/22/2024)   Depression (PHQ2-9)    PHQ-2 Score: 0  Alcohol Screen: Not on file  Housing: Unknown (09/05/2024)   Epic    Unable to Pay for Housing in the Last Year: No    Number of Times Moved in the Last Year: Not on file    Homeless in the Last Year: No  Utilities: Not on file  Health Literacy: Not on file    Outpatient Medications Prior to Visit  Medication Sig Dispense Refill   atorvastatin  (LIPITOR) 10 MG tablet TAKE 1 TABLET(10 MG) BY MOUTH DAILY 90 tablet 1   calcium  carbonate (OS-CAL) 600 MG TABS tablet Take 600 mg by mouth daily. Reported on 02/07/2016     finasteride  (PROSCAR ) 5 MG tablet TAKE 1 TABLET(5 MG) BY MOUTH DAILY 90 tablet 0   fluticasone  (FLONASE ) 50 MCG/ACT nasal spray SHAKE LIQUID AND USE 2 SPRAYS IN EACH NOSTRIL DAILY 48 g 1   gabapentin  (NEURONTIN ) 300 MG capsule Take 1 capsule (300 mg total) by mouth at bedtime. 90 capsule 1    ibuprofen (ADVIL,MOTRIN) 800 MG tablet Take 800 mg by mouth every 8 (eight) hours as needed for mild pain. Reported on 02/07/2016     levothyroxine  (SYNTHROID ) 137 MCG tablet Take 1 tablet (137 mcg total) by mouth daily before breakfast. 90 tablet 3   loratadine  (CLARITIN ) 10 MG tablet Take 10 mg by mouth daily. Reported on 02/07/2016     meloxicam  (MOBIC ) 15 MG tablet Take 1 tablet (15 mg total) by mouth daily. 10 tablet 0  Multiple Vitamins-Minerals (CENTRUM SILVER PO) Take 1 tablet by mouth daily. Reported on 02/07/2016     niacin  500 MG tablet Take 500 mg by mouth daily with breakfast. Reported on 02/07/2016     Omega-3 Fatty Acids (FISH OIL) 1000 MG CAPS Take 1 capsule by mouth daily. Reported on 02/07/2016     Psyllium (METAMUCIL PO) Take 1 scoop by mouth daily. Reported on 02/07/2016     tamsulosin  (FLOMAX ) 0.4 MG CAPS capsule TAKE 2 CAPSULES BY MOUTH EVERY NIGHT AT BEDTIME 180 capsule 0   benzonatate  (TESSALON ) 100 MG capsule Take 1 capsule (100 mg total) by mouth 2 (two) times daily as needed for cough. 30 capsule 0   pantoprazole  (PROTONIX ) 40 MG tablet Take 1 tablet (40 mg total) by mouth daily. 30 tablet 3   No facility-administered medications prior to visit.    Allergies[1]  Review of Systems *** PE;    09/09/2024    9:48 AM 08/06/2024    9:51 AM 07/22/2024   10:52 AM  Vitals with BMI  Height  5' 6 5' 6  Weight 166 lbs 3 oz 164 lbs 6 oz 170 lbs  BMI  26.55 27.45  Systolic 114 108 870  Diastolic 64 57 63  Pulse 79 77 65     *** Pertinent labs:  Lab Results  Component Value Date   TSH 0.95 05/03/2024   Lab Results  Component Value Date   WBC 5.7 08/06/2023   HGB 14.9 08/06/2023   HCT 46.1 08/06/2023   MCV 96.4 08/06/2023   PLT 199.0 08/06/2023   Lab Results  Component Value Date   CREATININE 0.95 05/03/2024   BUN 18 05/03/2024   NA 143 05/03/2024   K 4.1 05/03/2024   CL 107 05/03/2024   CO2 27 05/03/2024   Lab Results  Component Value Date   ALT 12  11/04/2023   AST 16 11/04/2023   ALKPHOS 84 11/04/2023   BILITOT 1.0 11/04/2023   Lab Results  Component Value Date   CHOL 121 05/03/2024   Lab Results  Component Value Date   HDL 41.20 05/03/2024   Lab Results  Component Value Date   LDLCALC 67 05/03/2024   Lab Results  Component Value Date   TRIG 63.0 05/03/2024   Lab Results  Component Value Date   CHOLHDL 3 05/03/2024   Lab Results  Component Value Date   PSA 3.94 11/11/2011   PSA 2.61 12/17/2010   Lab Results  Component Value Date   HGBA1C 5.8 (A) 09/09/2024   HGBA1C 5.8 09/09/2024   HGBA1C 5.8 09/09/2024   HGBA1C 5.8 09/09/2024   ASSESSMENT AND PLAN:   #1 Health maintenance exam: Reviewed age and gender appropriate health maintenance issues (prudent diet, regular exercise, health risks of tobacco and excessive alcohol, use of seatbelts, fire alarms in home, use of sunscreen).  Also reviewed age and gender appropriate health screening as well as vaccine recommendations. Vaccines: ALL UTD. Labs: CBC, CMET, lipid panel, and TSH. Prostate ca screening: no further prostate ca screening indicated due to age. Colon ca screening: most recent colonoscopy 04/2022 showed 3 adenomas. No further screening recommended.  An After Visit Summary was printed and given to the patient.  FOLLOW UP:  No follow-ups on file.  @esig @    [1] No Known Allergies  "

## 2024-11-03 ENCOUNTER — Encounter: Admitting: Family Medicine

## 2024-11-03 ENCOUNTER — Other Ambulatory Visit: Payer: Self-pay

## 2024-11-03 DIAGNOSIS — Z0000 Encounter for general adult medical examination without abnormal findings: Secondary | ICD-10-CM

## 2024-11-03 DIAGNOSIS — E7800 Pure hypercholesterolemia, unspecified: Secondary | ICD-10-CM

## 2024-11-03 DIAGNOSIS — E039 Hypothyroidism, unspecified: Secondary | ICD-10-CM

## 2024-11-03 MED ORDER — ATORVASTATIN CALCIUM 10 MG PO TABS: 10.0000 mg | ORAL_TABLET | Freq: Every day | ORAL | 0 refills | Status: DC

## 2024-11-03 NOTE — Telephone Encounter (Unsigned)
 Copied from CRM [id number]. Topic: Clinical - Prescription Issue >> Nov 03, 2024  7:43 AM Deleta RAMAN wrote: Reason for CRM: patient needs a refill on atorvastatin  (LIPITOR) 10 MG tablet. Refused by pcp due to physical apt being soon. Patient physical was rescheduled due to provider being out would like to speak with someone regarding prescription issue.

## 2024-11-03 NOTE — Telephone Encounter (Signed)
"  Rx sent  "

## 2024-11-08 NOTE — Telephone Encounter (Unsigned)
 Copied from CRM [id number]. Topic: Clinical - Medication Refill >> Nov 08, 2024 11:23 AM Alfonso HERO wrote: Medication: finasteride  (PROSCAR ) 5 MG tablet  Has the patient contacted their pharmacy? Yes (Agent: If no, request that the patient contact the pharmacy for the refill. If patient does not wish to contact the pharmacy document the reason why and proceed with request.) (Agent: If yes, when and what did the pharmacy advise?)  This is the patient's preferred pharmacy:  [id number] - [place], [place] - [id number] [place] [address] Phone: [phone] Fax: [phone]  Is this the correct pharmacy for this prescription? Yes If no, delete pharmacy and type the correct one.   Has the prescription been filled recently? Yes  Is the patient out of the medication? Yes  Has the patient been seen for an appointment in the last year OR does the patient have an upcoming appointment? Yes  Can we respond through MyChart? Yes  Agent: Please be advised that Rx refills may take up to 3 business days. We ask that you follow-up with your pharmacy.

## 2024-11-17 ENCOUNTER — Encounter: Payer: Self-pay | Admitting: Family Medicine

## 2024-11-17 ENCOUNTER — Ambulatory Visit (INDEPENDENT_AMBULATORY_CARE_PROVIDER_SITE_OTHER): Admitting: Family Medicine

## 2024-11-17 VITALS — BP 140/72 | HR 70 | Temp 97.4°F | Ht 64.0 in | Wt 170.6 lb

## 2024-11-17 DIAGNOSIS — Z0000 Encounter for general adult medical examination without abnormal findings: Secondary | ICD-10-CM

## 2024-11-17 DIAGNOSIS — E7800 Pure hypercholesterolemia, unspecified: Secondary | ICD-10-CM | POA: Diagnosis not present

## 2024-11-17 DIAGNOSIS — E039 Hypothyroidism, unspecified: Secondary | ICD-10-CM | POA: Diagnosis not present

## 2024-11-17 MED ORDER — TAMSULOSIN HCL 0.4 MG PO CAPS: 0.8000 mg | ORAL_CAPSULE | Freq: Every day | ORAL | 3 refills | Status: AC

## 2024-11-17 MED ORDER — ATORVASTATIN CALCIUM 10 MG PO TABS: 10.0000 mg | ORAL_TABLET | Freq: Every day | ORAL | 3 refills | Status: AC

## 2024-11-17 MED ORDER — GABAPENTIN 300 MG PO CAPS: 300.0000 mg | ORAL_CAPSULE | Freq: Every day | ORAL | 3 refills | Status: AC

## 2024-11-17 MED ORDER — FINASTERIDE 5 MG PO TABS: 5.0000 mg | ORAL_TABLET | Freq: Every day | ORAL | 3 refills | Status: AC

## 2024-11-17 NOTE — Progress Notes (Signed)
 "     Office Note 11/17/2024  CC:  Chief Complaint  Patient presents with   Annual Exam   Patient is a 89 y.o. male who is here for annual health maintenance exam and follow-up hypothyroidism and hypercholesterolemia. A/P as of last visit on 09/09/2024: 1 BPH with nocturia. Great improvement with 0.8 mg Flomax  nightly and 5 mg finasteride  daily. Continue.   2.  Prediabetes: Hemoglobin A1c about 4 months ago had risen to 6.3%. Hemoglobin A1c today is significantly improved to 5.8%.  INTERIM HX: Joel Kelley feels well. Unfortunately his 25 year old brother passed away last week.  No acute concerns.   Past Medical History:  Diagnosis Date   Adenomatous colon polyp 2012; 01/2016; 03/2019   Bx 2012 showed tubular adenoma but no HG dysplasia.  2017 : adenomatous.  03/2019: adenomatous.  04/2022 adenoma x 3   Arthritis In hands   Asymptomatic cholelithiasis 2017   Carpal tunnel syndrome, bilateral    Diverticulosis 03/2019   Sigmoid (on colonoscopy)   Erectile dysfunction    Flexion contracture of joint of hand, right    ?dupuytrens? Asymptomatic   Hearing loss of both ears    +hearing aids   HTN (hypertension)    Hypothyroidism    Increased prostate specific antigen (PSA) velocity 11/11/2011   Biopsy negative 03/11/12.  Followed by Dr. Renda (mild rise in PSA 2015, but Dr. Renda and pt agreed to continue with annual PSA/DRE, no invasive procedure).  PSA 4.19 04/2017.  Dr. Renda recommended no further PSA testing, just annual DRE.   Paresthesias 2024   All fingers-->gabapentin  helpful   Pleural effusion associated with pulmonary infection 04/2014   PULM: benign parapneumonic effusion; resolved 05/2014   Prediabetes    July 2025 hemoglobin A1c 6.3%   Pulmonary nodule 2015   with subcarinal adenopathy; stable on serial CT scans in 2015 and 09/2015, and 03/2016.  F/u CT 03/2017 stable---MAY BE CONSIDERED BENIGN.  NO FURTHER CT's needed.   Stress fracture of foot 2017   right    Past  Surgical History:  Procedure Laterality Date   CARDIOVASCULAR STRESS TEST  07/06/2014   NORMAL Myocardial perfusion imaging, normal EF, normal LV function, no wall motion abnormality   COLONOSCOPY W/ POLYPECTOMY  2012; 01/2016; 03/2019   2017 adenomatous polyp.  Rpt 03/2019->adenomatous polyp.  04/2022 adenoma x 3   EYE SURGERY  Cataracts   KNEE SURGERY     2009, laperoscopic   TONSILLECTOMY  1970   TRIGGER FINGER INJECTION Left 03/29/2020   UMBILICAL HERNIA REPAIR  1980    Family History  Problem Relation Age of Onset   Hypertension Mother    Hypertension Father     Social History   Socioeconomic History   Marital status: Married    Spouse name: Adrien   Number of children: Not on file   Years of education: Not on file   Highest education level: 12th grade  Occupational History   Occupation: Retired research officer, political party   Tobacco Use   Smoking status: Former    Current packs/day: 0.00    Average packs/day: 0.3 packs/day for 40.0 years (10.0 ttl pk-yrs)    Types: Cigarettes    Start date: 11/04/1948    Quit date: 11/04/1988    Years since quitting: 36.0   Smokeless tobacco: Never   Tobacco comments:    I quit already  Substance and Sexual Activity   Alcohol use: Never    Comment: quit in 1990    Drug use: Never  Sexual activity: Not Currently    Birth control/protection: None    Comment: lol  Other Topics Concern   Not on file  Social History Narrative   Divorced, remarried, one son and one step daughter (step-daughter is local), 2 grandchildren.   Relocated from [place]  about 2010.   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   Distant tobacco abuse: avg 5 cigs day for 20 yrs on and off, quit for good around 1990.   No alcohol.   Daily walking at the park.   Social Drivers of Health   Tobacco Use: Medium Risk (11/17/2024)   Patient History    Smoking Tobacco Use: Former    Smokeless Tobacco Use: Never    Passive Exposure: Not on file  Financial Resource Strain: Low Risk (09/05/2024)    Overall Financial Resource Strain (CARDIA)    Difficulty of Paying Living Expenses: Not hard at all  Food Insecurity: No Food Insecurity (09/05/2024)   Epic    Worried About Programme Researcher, Broadcasting/film/video in the Last Year: Never true    Ran Out of Food in the Last Year: Never true  Transportation Needs: No Transportation Needs (09/05/2024)   Epic    Lack of Transportation (Medical): No    Lack of Transportation (Non-Medical): No  Physical Activity: Sufficiently Active (09/05/2024)   Exercise Vital Sign    Days of Exercise per Week: 6 days    Minutes of Exercise per Session: 90 min  Stress: No Stress Concern Present (09/05/2024)   [employer] of Occupational Health - Occupational Stress Questionnaire    Feeling of Stress: Not at all  Social Connections: Socially Integrated (09/05/2024)   Social Connection and Isolation Panel    Frequency of Communication with Friends and Family: More than three times a week    Frequency of Social Gatherings with Friends and Family: Three times a week    Attends Religious Services: More than 4 times per year    Active Member of Clubs or Organizations: Yes    Attends Banker Meetings: 1 to 4 times per year    Marital Status: Married  Catering Manager Violence: Not on file  Depression (PHQ2-9): Low Risk (11/17/2024)   Depression (PHQ2-9)    PHQ-2 Score: 0  Alcohol Screen: Not on file  Housing: Unknown (09/05/2024)   Epic    Unable to Pay for Housing in the Last Year: No    Number of Times Moved in the Last Year: Not on file    Homeless in the Last Year: No  Utilities: Not on file  Health Literacy: Not on file    Outpatient Medications Prior to Visit  Medication Sig Dispense Refill   calcium  carbonate (OS-CAL) 600 MG TABS tablet Take 600 mg by mouth daily. Reported on 02/07/2016     fluticasone  (FLONASE ) 50 MCG/ACT nasal spray SHAKE LIQUID AND USE 2 SPRAYS IN EACH NOSTRIL DAILY 48 g 1   ibuprofen (ADVIL,MOTRIN) 800 MG tablet Take 800 mg by  mouth every 8 (eight) hours as needed for mild pain. Reported on 02/07/2016     levothyroxine  (SYNTHROID ) 137 MCG tablet Take 1 tablet (137 mcg total) by mouth daily before breakfast. 90 tablet 3   loratadine  (CLARITIN ) 10 MG tablet Take 10 mg by mouth daily. Reported on 02/07/2016     meloxicam  (MOBIC ) 15 MG tablet Take 1 tablet (15 mg total) by mouth daily. 10 tablet 0   Multiple Vitamins-Minerals (CENTRUM SILVER PO) Take 1 tablet by mouth daily. Reported on 02/07/2016  niacin  500 MG tablet Take 500 mg by mouth daily with breakfast. Reported on 02/07/2016     Omega-3 Fatty Acids (FISH OIL) 1000 MG CAPS Take 1 capsule by mouth daily. Reported on 02/07/2016     Psyllium (METAMUCIL PO) Take 1 scoop by mouth daily. Reported on 02/07/2016     atorvastatin  (LIPITOR) 10 MG tablet Take 1 tablet (10 mg total) by mouth daily. 30 tablet 0   finasteride  (PROSCAR ) 5 MG tablet TAKE 1 TABLET(5 MG) BY MOUTH DAILY 90 tablet 0   gabapentin  (NEURONTIN ) 300 MG capsule Take 1 capsule (300 mg total) by mouth at bedtime. 90 capsule 1   tamsulosin  (FLOMAX ) 0.4 MG CAPS capsule TAKE 2 CAPSULES BY MOUTH EVERY NIGHT AT BEDTIME 180 capsule 0   No facility-administered medications prior to visit.    Allergies[1]  Review of Systems  Constitutional:  Negative for appetite change, chills, fatigue and fever.  HENT:  Negative for congestion, dental problem, ear pain and sore throat.   Eyes:  Negative for discharge, redness and visual disturbance.  Respiratory:  Negative for cough, chest tightness, shortness of breath and wheezing.   Cardiovascular:  Negative for chest pain, palpitations and leg swelling.  Gastrointestinal:  Negative for abdominal pain, blood in stool, diarrhea, nausea and vomiting.  Genitourinary:  Negative for difficulty urinating, dysuria, flank pain, frequency, hematuria and urgency.  Musculoskeletal:  Negative for arthralgias, back pain, joint swelling, myalgias and neck stiffness.  Skin:  Negative for pallor  and rash.  Neurological:  Negative for dizziness, speech difficulty, weakness and headaches.  Hematological:  Negative for adenopathy. Does not bruise/bleed easily.  Psychiatric/Behavioral:  Negative for confusion and sleep disturbance. The patient is not nervous/anxious.    PE;    11/17/2024    2:35 PM 11/17/2024    2:26 PM 09/09/2024    9:48 AM  Vitals with BMI  Height  5' 4   Weight  170 lbs 10 oz 166 lbs 3 oz  BMI  29.27   Systolic 140 159 885  Diastolic 72 80 64  Pulse  70 79   Gen: Alert, well appearing.  Patient is oriented to person, place, time, and situation. AFFECT: pleasant, lucid thought and speech. ENT: Ears: EACs clear, normal epithelium.  TMs with good light reflex and landmarks bilaterally.  Eyes: no injection, icteris, swelling, or exudate.  EOMI, PERRLA. Nose: no drainage or turbinate edema/swelling.  No injection or focal lesion.  Mouth: lips without lesion/swelling.  Oral mucosa pink and moist.  Dentition intact and without obvious caries or gingival swelling.  Oropharynx without erythema, exudate, or swelling.  Neck: supple/nontender.  No LAD, mass, or TM.  Carotid pulses 2+ bilaterally, without bruits. CV: RRR, no m/r/g.   LUNGS: CTA bilat, nonlabored resps, good aeration in all lung fields. ABD: soft, NT, ND, BS normal.  No hepatospenomegaly or mass.  No bruits. EXT: no clubbing, cyanosis, or edema.  Musculoskeletal: no joint swelling, erythema, warmth, or tenderness.  ROM of all joints intact. Skin - no sores or suspicious lesions or rashes or color changes  Pertinent labs:  Lab Results  Component Value Date   TSH 0.95 05/03/2024   Lab Results  Component Value Date   WBC 5.7 08/06/2023   HGB 14.9 08/06/2023   HCT 46.1 08/06/2023   MCV 96.4 08/06/2023   PLT 199.0 08/06/2023   Lab Results  Component Value Date   CREATININE 0.95 05/03/2024   BUN 18 05/03/2024   NA 143 05/03/2024   K  4.1 05/03/2024   CL 107 05/03/2024   CO2 27 05/03/2024   Lab  Results  Component Value Date   ALT 12 11/04/2023   AST 16 11/04/2023   ALKPHOS 84 11/04/2023   BILITOT 1.0 11/04/2023   Lab Results  Component Value Date   CHOL 121 05/03/2024   Lab Results  Component Value Date   HDL 41.20 05/03/2024   Lab Results  Component Value Date   LDLCALC 67 05/03/2024   Lab Results  Component Value Date   TRIG 63.0 05/03/2024   Lab Results  Component Value Date   CHOLHDL 3 05/03/2024   Lab Results  Component Value Date   PSA 3.94 11/11/2011   PSA 2.61 12/17/2010   Lab Results  Component Value Date   HGBA1C 5.8 (A) 09/09/2024   HGBA1C 5.8 09/09/2024   HGBA1C 5.8 09/09/2024   HGBA1C 5.8 09/09/2024   ASSESSMENT AND PLAN:   #1 Health maintenance exam: Reviewed age and gender appropriate health maintenance issues (prudent diet, regular exercise, health risks of tobacco and excessive alcohol, use of seatbelts, fire alarms in home, use of sunscreen).  Also reviewed age and gender appropriate health screening as well as vaccine recommendations. Vaccines: ALL UTD. Labs: CBC, CMET, lipid panel, and TSH. Prostate ca screening: no further prostate ca screening indicated due to age. Colon ca screening: most recent colonoscopy 04/2022 showed 3 adenomas. No further screening recommended.  #2 hypercholesterolemia, doing well long-term on atorvastatin  10 mg a day. Lipid panel today and hepatic panel (he last ate approximately 6 hours ago).  #3 acquired hypothyroidism. Continue levothyroxine  137 mcg daily. TSH today.  4. Prediabetes: Hemoglobin A1c had risen to 6.3% about 6 mo ago. It then significantly improved to 5.8% on 09/09/24. Plan A1c at next follow-up in 6 months.  An After Visit Summary was printed and given to the patient.  FOLLOW UP:  Return in about 6 months (around 05/17/2025) for routine chronic illness f/u.  Signed:  Gerlene Hockey, MD           11/17/2024      [1] No Known Allergies  "

## 2024-11-17 NOTE — Patient Instructions (Signed)
 Health Maintenance, Male  Adopting a healthy lifestyle and getting preventive care are important in promoting health and wellness. Ask your health care provider about:  The right schedule for you to have regular tests and exams.  Things you can do on your own to prevent diseases and keep yourself healthy.  What should I know about diet, weight, and exercise?  Eat a healthy diet    Eat a diet that includes plenty of vegetables, fruits, low-fat dairy products, and lean protein.  Do not eat a lot of foods that are high in solid fats, added sugars, or sodium.  Maintain a healthy weight  Body mass index (BMI) is a measurement that can be used to identify possible weight problems. It estimates body fat based on height and weight. Your health care provider can help determine your BMI and help you achieve or maintain a healthy weight.  Get regular exercise  Get regular exercise. This is one of the most important things you can do for your health. Most adults should:  Exercise for at least 150 minutes each week. The exercise should increase your heart rate and make you sweat (moderate-intensity exercise).  Do strengthening exercises at least twice a week. This is in addition to the moderate-intensity exercise.  Spend less time sitting. Even light physical activity can be beneficial.  Watch cholesterol and blood lipids  Have your blood tested for lipids and cholesterol at 89 years of age, then have this test every 5 years.  You may need to have your cholesterol levels checked more often if:  Your lipid or cholesterol levels are high.  You are older than 89 years of age.  You are at high risk for heart disease.  What should I know about cancer screening?  Many types of cancers can be detected early and may often be prevented. Depending on your health history and family history, you may need to have cancer screening at various ages. This may include screening for:  Colorectal cancer.  Prostate cancer.  Skin cancer.  Lung  cancer.  What should I know about heart disease, diabetes, and high blood pressure?  Blood pressure and heart disease  High blood pressure causes heart disease and increases the risk of stroke. This is more likely to develop in people who have high blood pressure readings or are overweight.  Talk with your health care provider about your target blood pressure readings.  Have your blood pressure checked:  Every 3-5 years if you are 24-52 years of age.  Every year if you are 3 years old or older.  If you are between the ages of 60 and 72 and are a current or former smoker, ask your health care provider if you should have a one-time screening for abdominal aortic aneurysm (AAA).  Diabetes  Have regular diabetes screenings. This checks your fasting blood sugar level. Have the screening done:  Once every three years after age 66 if you are at a normal weight and have a low risk for diabetes.  More often and at a younger age if you are overweight or have a high risk for diabetes.  What should I know about preventing infection?  Hepatitis B  If you have a higher risk for hepatitis B, you should be screened for this virus. Talk with your health care provider to find out if you are at risk for hepatitis B infection.  Hepatitis C  Blood testing is recommended for:  Everyone born from [birth year] through 1963.  Anyone  with known risk factors for hepatitis C.  Sexually transmitted infections (STIs)  You should be screened each year for STIs, including gonorrhea and chlamydia, if:  You are sexually active and are younger than 89 years of age.  You are older than 89 years of age and your health care provider tells you that you are at risk for this type of infection.  Your sexual activity has changed since you were last screened, and you are at increased risk for chlamydia or gonorrhea. Ask your health care provider if you are at risk.  Ask your health care provider about whether you are at high risk for HIV. Your health care provider  may recommend a prescription medicine to help prevent HIV infection. If you choose to take medicine to prevent HIV, you should first get tested for HIV. You should then be tested every 3 months for as long as you are taking the medicine.  Follow these instructions at home:  Alcohol use  Do not drink alcohol if your health care provider tells you not to drink.  If you drink alcohol:  Limit how much you have to 0-2 drinks a day.  Know how much alcohol is in your drink. In the [place] one drink equals one 12 oz bottle of beer (355 mL), one 5 oz glass of wine (148 mL), or one 1 oz glass of hard liquor (44 mL).  Lifestyle  Do not use any products that contain nicotine or tobacco. These products include cigarettes, chewing tobacco, and vaping devices, such as e-cigarettes. If you need help quitting, ask your health care provider.  Do not use street drugs.  Do not share needles.  Ask your health care provider for help if you need support or information about quitting drugs.  General instructions  Schedule regular health, dental, and eye exams.  Stay current with your vaccines.  Tell your health care provider if:  You often feel depressed.  You have ever been abused or do not feel safe at home.  Summary  Adopting a healthy lifestyle and getting preventive care are important in promoting health and wellness.  Follow your health care provider's instructions about healthy diet, exercising, and getting tested or screened for diseases.  Follow your health care provider's instructions on monitoring your cholesterol and blood pressure.  This information is not intended to replace advice given to you by your health care provider. Make sure you discuss any questions you have with your health care provider.  Document Revised: 03/12/2021 Document Reviewed: 03/12/2021  [employer]vier Patient Education  2024 [employer].

## 2024-11-18 ENCOUNTER — Ambulatory Visit: Payer: Self-pay | Admitting: Family Medicine

## 2024-11-18 LAB — COMPREHENSIVE METABOLIC PANEL WITH GFR
ALT: 11 U/L (ref 3–53)
AST: 14 U/L (ref 5–37)
Albumin: 4.1 g/dL (ref 3.5–5.2)
Alkaline Phosphatase: 73 U/L (ref 39–117)
BUN: 16 mg/dL (ref 6–23)
CO2: 29 meq/L (ref 19–32)
Calcium: 8.8 mg/dL (ref 8.4–10.5)
Chloride: 107 meq/L (ref 96–112)
Creatinine, Ser: 1.11 mg/dL (ref 0.40–1.50)
GFR: 59.31 mL/min — ABNORMAL LOW
Glucose, Bld: 93 mg/dL (ref 70–99)
Potassium: 4.1 meq/L (ref 3.5–5.1)
Sodium: 143 meq/L (ref 135–145)
Total Bilirubin: 0.4 mg/dL (ref 0.2–1.2)
Total Protein: 6.5 g/dL (ref 6.0–8.3)

## 2024-11-18 LAB — CBC WITH DIFFERENTIAL/PLATELET
Basophils Absolute: 0.1 K/uL (ref 0.0–0.1)
Basophils Relative: 1.1 % (ref 0.0–3.0)
Eosinophils Absolute: 0.2 K/uL (ref 0.0–0.7)
Eosinophils Relative: 2.8 % (ref 0.0–5.0)
HCT: 42.9 % (ref 39.0–52.0)
Hemoglobin: 14.2 g/dL (ref 13.0–17.0)
Lymphocytes Relative: 26.3 % (ref 12.0–46.0)
Lymphs Abs: 2 K/uL (ref 0.7–4.0)
MCHC: 33 g/dL (ref 30.0–36.0)
MCV: 94.2 fl (ref 78.0–100.0)
Monocytes Absolute: 0.6 K/uL (ref 0.1–1.0)
Monocytes Relative: 7.9 % (ref 3.0–12.0)
Neutro Abs: 4.7 K/uL (ref 1.4–7.7)
Neutrophils Relative %: 61.9 % (ref 43.0–77.0)
Platelets: 199 K/uL (ref 150.0–400.0)
RBC: 4.56 Mil/uL (ref 4.22–5.81)
RDW: 14.9 % (ref 11.5–15.5)
WBC: 7.6 K/uL (ref 4.0–10.5)

## 2024-11-18 LAB — LIPID PANEL
Cholesterol: 127 mg/dL (ref 28–200)
HDL: 47.7 mg/dL
LDL Cholesterol: 63 mg/dL (ref 10–99)
NonHDL: 79.05
Total CHOL/HDL Ratio: 3
Triglycerides: 78 mg/dL (ref 10.0–149.0)
VLDL: 15.6 mg/dL (ref 0.0–40.0)

## 2024-11-18 LAB — TSH: TSH: 1.53 u[IU]/mL (ref 0.35–5.50)

## 2024-11-19 ENCOUNTER — Other Ambulatory Visit: Payer: Self-pay | Admitting: Family Medicine

## 2025-05-18 ENCOUNTER — Ambulatory Visit: Admitting: Family Medicine
